# Patient Record
Sex: Male | Born: 1935 | Race: White | Hispanic: No | Smoking: Never smoker
Health system: Southern US, Community
[De-identification: ages and names within clinical notes are randomized; demographics above are authoritative.]

## PROBLEM LIST (undated history)

## (undated) DIAGNOSIS — E119 Type 2 diabetes mellitus without complications: Secondary | ICD-10-CM

## (undated) DIAGNOSIS — N390 Urinary tract infection, site not specified: Secondary | ICD-10-CM

## (undated) DIAGNOSIS — I10 Essential (primary) hypertension: Secondary | ICD-10-CM

## (undated) DIAGNOSIS — G459 Transient cerebral ischemic attack, unspecified: Secondary | ICD-10-CM

## (undated) DIAGNOSIS — H409 Unspecified glaucoma: Secondary | ICD-10-CM

## (undated) DIAGNOSIS — D126 Benign neoplasm of colon, unspecified: Secondary | ICD-10-CM

## (undated) DIAGNOSIS — N400 Enlarged prostate without lower urinary tract symptoms: Secondary | ICD-10-CM

## (undated) DIAGNOSIS — N189 Chronic kidney disease, unspecified: Secondary | ICD-10-CM

## (undated) DIAGNOSIS — T7840XA Allergy, unspecified, initial encounter: Secondary | ICD-10-CM

## (undated) DIAGNOSIS — N200 Calculus of kidney: Secondary | ICD-10-CM

## (undated) DIAGNOSIS — K219 Gastro-esophageal reflux disease without esophagitis: Secondary | ICD-10-CM

## (undated) DIAGNOSIS — M1990 Unspecified osteoarthritis, unspecified site: Secondary | ICD-10-CM

## (undated) DIAGNOSIS — E785 Hyperlipidemia, unspecified: Secondary | ICD-10-CM

## (undated) DIAGNOSIS — A048 Other specified bacterial intestinal infections: Secondary | ICD-10-CM

## (undated) DIAGNOSIS — K5790 Diverticulosis of intestine, part unspecified, without perforation or abscess without bleeding: Secondary | ICD-10-CM

## (undated) HISTORY — DX: Chronic kidney disease, unspecified: N18.9

## (undated) HISTORY — PX: CATARACT EXTRACTION: SUR2

## (undated) HISTORY — DX: Essential (primary) hypertension: I10

## (undated) HISTORY — DX: Urinary tract infection, site not specified: N39.0

## (undated) HISTORY — DX: Gastro-esophageal reflux disease without esophagitis: K21.9

## (undated) HISTORY — DX: Diverticulosis of intestine, part unspecified, without perforation or abscess without bleeding: K57.90

## (undated) HISTORY — PX: LITHOTRIPSY: SUR834

## (undated) HISTORY — DX: Benign prostatic hyperplasia without lower urinary tract symptoms: N40.0

## (undated) HISTORY — DX: Hyperlipidemia, unspecified: E78.5

## (undated) HISTORY — PX: ESOPHAGOGASTRODUODENOSCOPY ENDOSCOPY: SHX5814

## (undated) HISTORY — PX: COLONOSCOPY: SHX174

## (undated) HISTORY — DX: Unspecified glaucoma: H40.9

## (undated) HISTORY — DX: Allergy, unspecified, initial encounter: T78.40XA

## (undated) HISTORY — DX: Transient cerebral ischemic attack, unspecified: G45.9

## (undated) HISTORY — DX: Calculus of kidney: N20.0

## (undated) HISTORY — DX: Unspecified osteoarthritis, unspecified site: M19.90

## (undated) HISTORY — DX: Benign neoplasm of colon, unspecified: D12.6

---

## 2007-02-19 ENCOUNTER — Emergency Department (HOSPITAL_COMMUNITY): Admission: EM | Admit: 2007-02-19 | Discharge: 2007-02-19 | Payer: Self-pay | Admitting: Emergency Medicine

## 2007-02-22 ENCOUNTER — Ambulatory Visit (HOSPITAL_COMMUNITY): Admission: RE | Admit: 2007-02-22 | Discharge: 2007-02-22 | Payer: Self-pay | Admitting: Urology

## 2007-05-02 ENCOUNTER — Ambulatory Visit (HOSPITAL_COMMUNITY): Admission: RE | Admit: 2007-05-02 | Discharge: 2007-05-02 | Payer: Self-pay | Admitting: Urology

## 2007-05-03 ENCOUNTER — Ambulatory Visit (HOSPITAL_COMMUNITY): Admission: RE | Admit: 2007-05-03 | Discharge: 2007-05-03 | Payer: Self-pay | Admitting: Urology

## 2007-06-05 ENCOUNTER — Ambulatory Visit (HOSPITAL_COMMUNITY): Admission: RE | Admit: 2007-06-05 | Discharge: 2007-06-05 | Payer: Self-pay | Admitting: Urology

## 2007-06-15 ENCOUNTER — Ambulatory Visit (HOSPITAL_COMMUNITY): Admission: RE | Admit: 2007-06-15 | Discharge: 2007-06-15 | Payer: Self-pay | Admitting: Urology

## 2007-06-25 ENCOUNTER — Ambulatory Visit (HOSPITAL_COMMUNITY): Admission: RE | Admit: 2007-06-25 | Discharge: 2007-06-25 | Payer: Self-pay | Admitting: Urology

## 2007-07-02 ENCOUNTER — Encounter (INDEPENDENT_AMBULATORY_CARE_PROVIDER_SITE_OTHER): Payer: Self-pay | Admitting: Urology

## 2007-07-02 ENCOUNTER — Ambulatory Visit (HOSPITAL_COMMUNITY): Admission: RE | Admit: 2007-07-02 | Discharge: 2007-07-02 | Payer: Self-pay | Admitting: Urology

## 2007-07-16 ENCOUNTER — Ambulatory Visit (HOSPITAL_COMMUNITY): Admission: RE | Admit: 2007-07-16 | Discharge: 2007-07-16 | Payer: Self-pay | Admitting: Urology

## 2007-08-13 ENCOUNTER — Ambulatory Visit (HOSPITAL_COMMUNITY): Admission: RE | Admit: 2007-08-13 | Discharge: 2007-08-13 | Payer: Self-pay | Admitting: Urology

## 2007-09-25 ENCOUNTER — Ambulatory Visit (HOSPITAL_COMMUNITY): Admission: RE | Admit: 2007-09-25 | Discharge: 2007-09-25 | Payer: Self-pay | Admitting: Urology

## 2007-11-15 ENCOUNTER — Ambulatory Visit: Payer: Self-pay | Admitting: Internal Medicine

## 2007-11-15 DIAGNOSIS — D126 Benign neoplasm of colon, unspecified: Secondary | ICD-10-CM | POA: Insufficient documentation

## 2007-11-15 DIAGNOSIS — Z86010 Personal history of colonic polyps: Secondary | ICD-10-CM | POA: Insufficient documentation

## 2007-11-16 ENCOUNTER — Ambulatory Visit (HOSPITAL_COMMUNITY): Admission: RE | Admit: 2007-11-16 | Discharge: 2007-11-16 | Payer: Self-pay | Admitting: Urology

## 2007-11-19 ENCOUNTER — Encounter: Payer: Self-pay | Admitting: Internal Medicine

## 2007-11-19 ENCOUNTER — Ambulatory Visit: Payer: Self-pay | Admitting: Internal Medicine

## 2007-11-21 ENCOUNTER — Encounter: Payer: Self-pay | Admitting: Internal Medicine

## 2008-02-13 ENCOUNTER — Ambulatory Visit: Payer: Self-pay | Admitting: Internal Medicine

## 2008-02-27 ENCOUNTER — Encounter: Payer: Self-pay | Admitting: Internal Medicine

## 2008-02-27 ENCOUNTER — Ambulatory Visit: Payer: Self-pay | Admitting: Internal Medicine

## 2008-02-29 ENCOUNTER — Encounter: Payer: Self-pay | Admitting: Internal Medicine

## 2008-03-05 ENCOUNTER — Ambulatory Visit: Payer: Self-pay | Admitting: Cardiology

## 2008-03-05 ENCOUNTER — Observation Stay (HOSPITAL_COMMUNITY): Admission: EM | Admit: 2008-03-05 | Discharge: 2008-03-06 | Payer: Self-pay | Admitting: Emergency Medicine

## 2008-03-06 ENCOUNTER — Encounter: Payer: Self-pay | Admitting: Cardiology

## 2008-07-04 ENCOUNTER — Ambulatory Visit (HOSPITAL_COMMUNITY): Admission: RE | Admit: 2008-07-04 | Discharge: 2008-07-04 | Payer: Self-pay | Admitting: Urology

## 2008-07-14 ENCOUNTER — Encounter (INDEPENDENT_AMBULATORY_CARE_PROVIDER_SITE_OTHER): Payer: Self-pay | Admitting: *Deleted

## 2008-07-14 LAB — CONVERTED CEMR LAB
Alkaline Phosphatase: 79 units/L
Cholesterol: 142 mg/dL
Creatinine, Ser: 1.17 mg/dL
GFR calc non Af Amer: >60 mL/min
Glomerular Filtration Rate, Af Am: >60 mL/min/{1.73_m2}
Glucose, Bld: 114 mg/dL
HDL: 48 mg/dL
Potassium: 4.1 meq/L
TSH: 3.637 microintl units/mL

## 2009-01-05 ENCOUNTER — Ambulatory Visit (HOSPITAL_COMMUNITY): Admission: RE | Admit: 2009-01-05 | Discharge: 2009-01-05 | Payer: Self-pay | Admitting: Urology

## 2009-01-14 ENCOUNTER — Emergency Department (HOSPITAL_COMMUNITY): Admission: EM | Admit: 2009-01-14 | Discharge: 2009-01-14 | Payer: Self-pay | Admitting: Emergency Medicine

## 2009-01-14 LAB — CONVERTED CEMR LAB
CO2: 28 meq/L
Creatinine, Ser: 1.3 mg/dL
Potassium: 4.1 meq/L
Sodium: 137 meq/L

## 2009-02-10 ENCOUNTER — Encounter (INDEPENDENT_AMBULATORY_CARE_PROVIDER_SITE_OTHER): Payer: Self-pay | Admitting: *Deleted

## 2009-02-13 ENCOUNTER — Ambulatory Visit: Payer: Self-pay | Admitting: Cardiology

## 2009-02-13 ENCOUNTER — Encounter (INDEPENDENT_AMBULATORY_CARE_PROVIDER_SITE_OTHER): Payer: Self-pay | Admitting: *Deleted

## 2009-02-13 DIAGNOSIS — G459 Transient cerebral ischemic attack, unspecified: Secondary | ICD-10-CM | POA: Insufficient documentation

## 2009-02-13 DIAGNOSIS — R079 Chest pain, unspecified: Secondary | ICD-10-CM | POA: Insufficient documentation

## 2009-02-13 DIAGNOSIS — Z87898 Personal history of other specified conditions: Secondary | ICD-10-CM

## 2009-02-13 DIAGNOSIS — E782 Mixed hyperlipidemia: Secondary | ICD-10-CM | POA: Insufficient documentation

## 2009-02-13 DIAGNOSIS — E785 Hyperlipidemia, unspecified: Secondary | ICD-10-CM

## 2009-02-13 DIAGNOSIS — I10 Essential (primary) hypertension: Secondary | ICD-10-CM | POA: Insufficient documentation

## 2009-02-13 DIAGNOSIS — N200 Calculus of kidney: Secondary | ICD-10-CM

## 2009-02-24 ENCOUNTER — Encounter (INDEPENDENT_AMBULATORY_CARE_PROVIDER_SITE_OTHER): Payer: Self-pay | Admitting: *Deleted

## 2009-02-25 ENCOUNTER — Ambulatory Visit: Payer: Self-pay | Admitting: Internal Medicine

## 2009-02-26 ENCOUNTER — Ambulatory Visit: Payer: Self-pay | Admitting: Cardiology

## 2009-03-20 ENCOUNTER — Encounter: Payer: Self-pay | Admitting: Cardiology

## 2009-03-26 ENCOUNTER — Ambulatory Visit: Payer: Self-pay | Admitting: Internal Medicine

## 2009-03-30 ENCOUNTER — Encounter: Payer: Self-pay | Admitting: Internal Medicine

## 2009-07-07 ENCOUNTER — Ambulatory Visit (HOSPITAL_COMMUNITY): Admission: RE | Admit: 2009-07-07 | Discharge: 2009-07-07 | Payer: Self-pay | Admitting: Urology

## 2010-02-23 NOTE — Assessment & Plan Note (Signed)
Summary: **per Dr.Zach Margo Aye to evaluate for stress test/tg   Visit Type:  Follow-up Referring Provider:  Neurology-Dr. Pearlean Brownie Primary Provider:  Dr. Catalina Pizza   History of Present Illness: It was my pleasure to evaluate this nice gentleman from a cardiovascular standpoint at the request of Dr. Margo Aye.  Stephen [name] was admitted to our service in February of last year when he presented with chest discomfort, headache and neurologic symptoms.  Carotid ultrasound was initially interpreted as showing moderate obstructive disease, but subsequent assessment by Dr. Pearlean Brownie proved negative.  He has not been treated for transient ischemic attack nor has he had any recurrent symptoms.  His chest discomfort was considered atypical, and only a resting echocardiogram was obtained, which was normal.  He presented again to the emergency department last month with vague chest symptoms and elevated blood pressure.  He experienced a poorly characterized sensation in his chest that sounds most like a wave of heat ascending from his legs to the top of his head.  There was no associated dyspnea, diaphoresis nor nausea.  Symptoms passed within a few minutes;  cardiac markers and EKGs were negative.  He was discharged home from the ED and  has felt fine since.  EKG  Procedure date:  02/13/2009  Findings:      Normal sinus rhythm Frequent PACs Delayed R-wave progression Otherwise within normal limits No prior tracing for comparison  CXR  Procedure date:  01/14/2009  Findings:      No acute cardiopulmonary abnormality No osseous abnormality  Echocardiogram  Procedure date:  03/06/2008  Findings:      LV-normal size; no left ventricular hypertrophy; normal systolic function; The ejection fraction of 60%.  No significant valvular abnormalities; trivial tricuspid regurgitation.  -  Date:  01/14/2009    BG Random: 117    BUN: 14    Creatinine: 1.3    Sodium: 137    Potassium: 4.1    Chloride: 103  CO2 Total: 28    Calcium: 8.7    GFR(Non African American): 54   Current Medications (verified): 1)  Co Q-10 30 Mg  Caps (Coenzyme Q10) .Marland Kitchen.. 1 By Mouth Once Daily 2)  Evening Primrose Oil 500 Mg Caps (Evening Primrose Oil) .Marland Kitchen.. 1 By Mouth Two Times A Day 3)  Flax   Oil (Flaxseed (Linseed)) .Marland Kitchen.. 1 By Mouth Two Times A Day 4)  Natural Vitamin E 400 Unit Caps (Vitamin E) .Marland Kitchen.. 1 By Mouth Two Times A Day 5)  Vitamin D3 10000 Unit Caps (Cholecalciferol) .Marland Kitchen.. 1 By Mouth Two Times A Day 6)  Multivitamins   Tabs (Multiple Vitamin) 7)  B-Complex/b-12  Liqd (B Complex Vitamins) .Marland Kitchen.. 1 Sl Once Daily 8)  Probiotic  Caps (Misc Intestinal Flora Regulat) .Marland Kitchen.. 1 By Mouth Once Daily 9)  Metamucil 48.57 % Powd (Psyllium) .... 3 Grams Two Times A Day 10)  Crestor 5 Mg Tabs (Rosuvastatin Calcium) .... Take 1/2 Tab Daily 11)  Amlodipine Besylate 10 Mg Tabs (Amlodipine Besylate) .... Take 1 Tab Daily 12)  Niaspan 1000 Mg Cr-Tabs (Niacin (Antihyperlipidemic)) .... Take 1 Tab At Bedtime 13)  Colace 100 Mg Caps (Docusate Sodium) .... Take 1 Tab Two Times A Day 14)  Krill Oil 1000 Mg Caps (Krill Oil) .... Take 1 Cap Daily 15)  Sm Chromium Picolinate 200 Mcg Tabs (Chromium Picolinate) .... Take 1 Tab Two Times A Day 16)  Magnesium 200 Mg Tabs (Magnesium) .... Take 1 Tab Two Times A Day 17)  Vitamin K (Phytonadione)  100 Mcg Tabs (Phytonadione) .... Take 1 Tab Two Times A Day  Allergies (verified): 1)  ! * Levoquin 2)  ! Benicar (Olmesartan Medoxomil)  Past History:  Past Medical History: Last updated: 2009-02-22 Chest pain Right cerebral transient ischemic attack in 02/2008 Hyperlipidemia Hypertension Chronic kidney disease: Creatinine 1.2 in 2010 Benign prostatic hypertrophy Kidney Stones Urinary Tract Infection Arthritis Adenomatous Colon Polyps  Past Surgical History: Last updated: 2009/02/22 Bilateral cataract extraction Lithotripsy for nephrolithiasis  Family History: Last updated:  February 22, 2009 Father died due to myocardial infarction in his 57s No FH of Colon Cancer: Family History of Pancreatic Cancer: Brother Family History of Colon Polyps:Brother Family History of Heart Disease: Brother Lung Cancer Brother  Social History: Last updated: 2009-02-22 Resides in [place] by himself; widower Alcohol Use - no Daily Caffeine Use Illicit Drug Use - no Patient gets regular exercise. Retired from [employer] work Patient has never smoked.   Review of Systems       Requires corrective lenses for near vision; intermittent constipation; gastroesophageal reflux disease symptoms; urinary frequency; diffuse arthritic discomfort.  All other systems reviewed and are negative.  Vital Signs:  Patient profile:   75 year old male Weight:      174 pounds BMI:     26.55 Pulse rate:   74 / minute BP sitting:   168 / 72  (right arm)  Vitals Entered By: Dreama Saa, CNA (02/22/2009 1:55 PM)  Physical Exam  General:   General-Well-developed; no acute distress: HEENT-[place]/AT; PERRL; EOM intact; conjunctiva and lids nl:  Neck-No JVD; no carotid bruits: Endocrine-No thyromegaly: Lungs-No tachypnea, clear without rales, rhonchi or wheezes: CV-normal PMI; normal S1 and S2:;  Abdomen-BS normal; soft and non-tender without masses or organomegaly: MS-No deformities, cyanosis or clubbing: Neurologic-Nl cranial nerves; symmetric strength and tone: Skin- Warm, no sig. lesions: Extremities-Nl distal pulses; no edema    Impression & Recommendations:  Problem # 1:  CHEST PAIN (ICD-786.50) Symptoms are quite atypical, and both EKGs and cardiac markers were interpreted as negative in the emergency department.  His electrocardiogram today shows delayed R wave progression that is probably a normal variant.  In light of his echocardiogram less than a year ago that revealed normal left ventricular systolic function, no further testing for this possibility is presently needed.  A  standard treadmill stress test will be performed.  Problem # 2:  HYPERLIPIDEMIA (ICD-272.4) Control of hyperlipidemia is excellent.  Lipid profile 6 months ago showed total cholesterol of 142, triglycerides 134, HDL 40 and LDL 67.  Current regimen will be continued.  Problem # 3:  HYPERTENSION, BENIGN (ICD-401.1) Blood pressure control is suboptimal at today's visit, but the patient reports white coat hypertension and good values at home.  No modification in his medical regime will be undertaken at present.  Problem # 4:  TRANSIENT ISCHEMIC ATTACK (ICD-435.9) No therapy was recommended for him during his last hospitalization, but he does take aspirin on a daily basis to prevent flushing related to niacin.  This is appropriate therapy for his possible cerebrovascular disease.  No routine Cardiology f/u appears necessary at the present time.  Other Orders: Treadmill (Treadmill)  Patient Instructions: 1)  Your physician recommends that you schedule a follow-up appointment in: as needed 2)  Your physician has requested that you have an exercise tolerance test.  For further information please visit [email or web address]  Please also follow instruction sheet, as given.

## 2010-02-23 NOTE — Letter (Signed)
Summary: [place] Treadmill (Nuc Med Stress)  [facility]  [address] [place] [address]   Phone: [phone]  Fax: [phone]    Nuclear Medicine 1-Day Stress Test Information Sheet  Re:     Stephen [name]   DOB:     November 16, 1935 MRN:     [id number] Weight:  Appointment Date: Register at: Appointment Time: Referring MD:  _ X__Exercise Stress  __Adenosine   __Dobutamine  __Lexiscan  __Persantine   __Thallium  Urgency: ____1 (next day)   ____2 (one week)    ____3 (PRN)  Patient will receive Follow Up call with results: Patient needs follow-up appointment:  Instructions regarding medication:  How to prepare for your stress test: 1. DO NOT eat or dring 6 hours prior to your arrival time. This includes no caffeine (coffee, tea, sodas, chocolate) if you were instructed to take your medications, drink water with it. 2. DO NOT use any tobacco products for at leaset 8 hours prior to arrival. 3. DO NOT wear dresses or any clothing that may have metal clasps or buttons. 4. Wear short sleeve shirts, loose clothing, and comfortalbe walking shoes. 5. DO NOT use lotions, oils or powder on your chest before the test. 6. The test will take approximately 3-4 hours from the time you arrive until completion. 7. To register the day of the test, go to the Short Stay entrance at [facility]. 8. If you must cancel your test, call [phone] as soon as you are aware.  After you arrive for test:   When you arrive at [facility], you will go to Short Stay to be registered. They will then send you to Radiology to check in. The Nuclear Medicine Tech will get you and start an IV in your arm or hand. A small amount of a radioactive tracer will then be injected into your IV. This tracer will then have to circulate for 30-45 minutes. During this time you will wait in the waiting room and you will be able to drink something without caffeine. A series of pictures will be  taken of your heart follwoing this waiting period. After the 1st set of pictures you will go to the stress lab to get ready for your stress test. During the stress test, another small amount of a radioactive tracer will be injected through your IV. When the stress test is complete, there is a short rest period while your heart rate and blood pressure will be monitored. When this monitoring period is complete you will have another set of pictrues taken. (The same as the 1st set of pictures). These pictures are taken between 15 minutes and 1 hour after the stress test. The time depends on the type of stress test you had. Your doctor will inform you of your test results within 7 days after test.    The possibilities of certain changes are possible during the test. They include abnormal blood pressure and disorders of the heart. Side effects of persantine or adenosine can include flushing, chest pain, shortness of breath, stomach tightness, headache and light-headedness. These side effects usually do not last long and are self-resolving. Every effort will be made to keep you comfortable and to minimize complications by obtaining a medical history and by close observation during the test. Emergency equipment, medications, and trained personnel are available to deal with any unusual situation which may arise.  Please notify office at least 48 hours in advance if you are unable to  keep this appt.

## 2010-02-23 NOTE — Letter (Signed)
Summary: MEDICATION LIST  MEDICATION LIST   Imported By: Faythe Ghee 03/20/2009 10:37:04  _____________________________________________________________________  External Attachment:    Type:   Image     Comment:   External Document

## 2010-02-23 NOTE — Miscellaneous (Signed)
Summary: [facility] Previsit/prep  Clinical Lists Changes  Medications: Added new medication of MOVIPREP 100 GM  SOLR (PEG-KCL-NACL-NASULF-NA ASC-C) As per prep instructions. - Signed Rx of MOVIPREP 100 GM  SOLR (PEG-KCL-NACL-NASULF-NA ASC-C) As per prep instructions.;  #1 x 0;  Signed;  Entered by: Wyona Almas RN;  Authorized by: Hilarie Fredrickson MD;  Method used: Electronically to [facility] [phone]*, [address] [place], [place], [place]  [address], Ph: [phone] or [phone], Fax: [phone] Observations: Added new observation of ALLERGY REV: Done (02/25/2009 15:39)    Prescriptions: MOVIPREP 100 GM  SOLR (PEG-KCL-NACL-NASULF-NA ASC-C) As per prep instructions.  #1 x 0   Entered by:   Wyona Almas RN   Authorized by:   Hilarie Fredrickson MD   Signed by:   Wyona Almas RN on 02/25/2009   Method used:   Electronically to        [facility] [phone]* (retail)       [address]       [place], [place]  [address]       Ph: [phone] or [phone]       Fax: [phone]   RxID:   [id number]

## 2010-02-23 NOTE — Miscellaneous (Signed)
Summary: LABS CMP,LIPIDS,TSH 07/14/2008  Clinical Lists Changes  Observations: Added new observation of CALCIUM: 9.0 mg/dL (16/10/9602 54:09) Added new observation of ALBUMIN: 4.6 g/dL (81/19/1478 29:56) Added new observation of PROTEIN, TOT: 7.5 g/dL (21/30/8657 84:69) Added new observation of SGPT (ALT): 21 units/L (07/14/2008 11:37) Added new observation of SGOT (AST): 16 units/L (07/14/2008 11:37) Added new observation of ALK PHOS: 79 units/L (07/14/2008 11:37) Added new observation of GFR AA: >60 mL/min/1.68m2 (07/14/2008 11:37) Added new observation of GFR: >60 mL/min (07/14/2008 11:37) Added new observation of CREATININE: 1.17 mg/dL (62/95/2841 32:44) Added new observation of BUN: 18 mg/dL (01/26/7251 66:44) Added new observation of BG RANDOM: 114 mg/dL (03/47/4259 56:38) Added new observation of CO2 PLSM/SER: 23 meq/L (07/14/2008 11:37) Added new observation of CL SERUM: 103 meq/L (07/14/2008 11:37) Added new observation of K SERUM: 4.1 meq/L (07/14/2008 11:37) Added new observation of NA: 141 meq/L (07/14/2008 11:37) Added new observation of LDL: 67 mg/dL (75/64/3329 51:88) Added new observation of HDL: 48 mg/dL (41/66/0630 16:01) Added new observation of TRIGLYC TOT: 134 mg/dL (09/32/3557 32:20) Added new observation of CHOLESTEROL: 142 mg/dL (25/42/7062 37:62) Added new observation of TSH: 3.637 microintl units/mL (07/14/2008 11:37)

## 2010-02-23 NOTE — Letter (Signed)
Summary: Patient Notice- Polyp Results  [facility]  [address] [place], [place] [address]   Phone: [phone]  Fax: [phone]        March 30, 2009 MRN: [id number]    [place] [address] [place], [place]  [address]    Dear Mr. Boehner,  I am pleased to inform you that the colon polyp(s) removed during your recent colonoscopy was (were) found to be benign (no cancer detected) upon pathologic examination.  I recommend you have a repeat colonoscopy examination in 2 years to look for recurrent polyps, as having colon polyps increases your risk for having recurrent polyps or even colon cancer in the future.  Should you develop new or worsening symptoms of abdominal pain, bowel habit changes or bleeding from the rectum or bowels, please schedule an evaluation with either your primary care physician or with me.  Additional information/recommendations:  __ No further action with gastroenterology is needed at this time. Please      follow-up with your primary care physician for your other healthcare      needs.   Please call us if you are having persistent problems or have questions about your condition that have not been fully answered at this time.  Sincerely,  Hilarie Fredrickson MD  This letter has been electronically signed by your physician.  Appended Document: Patient Notice- Polyp Results Letter mailed 3.8.11

## 2010-02-23 NOTE — Letter (Signed)
Summary: [facility] Instructions  Stephen [name]  [address] [name], [place] [address]   Phone: [phone]  Fax: [phone]       Stephen [name]    [birth year]    MRN: [id number]        Procedure Day Dorna Bloom:  Stephen [name]  03/26/09     Arrival Time:  12:30PM     Procedure Time:  1:30PM     Location of Procedure:                    _ X_  [facility] (4th Floor)                        PREPARATION FOR COLONOSCOPY WITH MOVIPREP   Starting 5 days prior to your procedure 03/21/09 do not eat nuts, seeds, popcorn, corn, beans, peas,  salads, or any raw vegetables.  Do not take any fiber supplements (e.g. Metamucil, Citrucel, and Benefiber).  THE DAY BEFORE YOUR PROCEDURE         DATE: 03/25/09  DAY: WEDNESDAY  1.  Drink clear liquids the entire day-NO SOLID FOOD  2.  Do not drink anything colored red or purple.  Avoid juices with pulp.  No orange juice.  3.  Drink at least 64 oz. (8 glasses) of fluid/clear liquids during the day to prevent dehydration and help the prep work efficiently.  CLEAR LIQUIDS INCLUDE: Water Jello Ice Popsicles Tea (sugar ok, no milk/cream) Powdered fruit flavored drinks Coffee (sugar ok, no milk/cream) Gatorade Juice: apple, white grape, white cranberry  Lemonade Clear bullion, consomm, broth Carbonated beverages (any kind) Strained chicken noodle soup Hard Candy                             4.  In the morning, mix first dose of MoviPrep solution:    Empty 1 Pouch A and 1 Pouch B into the disposable container    Add lukewarm drinking water to the top line of the container. Mix to dissolve    Refrigerate (mixed solution should be used within 24 hrs)  5.  Begin drinking the prep at 5:00 p.m. The MoviPrep container is divided by 4 marks.   Every 15 minutes drink the solution down to the next mark (approximately 8 oz) until the full liter is complete.   6.  Follow completed prep with 16 oz of clear liquid of your choice  (Nothing red or purple).  Continue to drink clear liquids until bedtime.  7.  Before going to bed, mix second dose of MoviPrep solution:    Empty 1 Pouch A and 1 Pouch B into the disposable container    Add lukewarm drinking water to the top line of the container. Mix to dissolve    Refrigerate  THE DAY OF YOUR PROCEDURE      DATE: 03/26/09  DAY: THURSDAY Beginning at 8:30AM (5 hours before procedure):         1. Every 15 minutes, drink the solution down to the next mark (approx 8 oz) until the full liter is complete.  2. Follow completed prep with 16 oz. of clear liquid of your choice.    3. You may drink clear liquids until 11:30AM (2 HOURS BEFORE PROCEDURE).   MEDICATION INSTRUCTIONS  Unless otherwise instructed, you should take regular prescription medications with a small sip of water   as early as possible the morning of  your procedure.          OTHER INSTRUCTIONS  You will need a responsible adult at least 73 years of age to accompany you and drive you home.   This person must remain in the waiting room during your procedure.  Wear loose fitting clothing that is easily removed.  Leave jewelry and other valuables at home.  However, you may wish to bring a book to read or  an iPod/MP3 player to listen to music as you wait for your procedure to start.  Remove all body piercing jewelry and leave at home.  Total time from sign-in until discharge is approximately 2-3 hours.  You should go home directly after your procedure and rest.  You can resume normal activities the  day after your procedure.  The day of your procedure you should not:   Drive   Make legal decisions   Operate machinery   Drink alcohol   Return to work  You will receive specific instructions about eating, activities and medications before you leave.    The above instructions have been reviewed and explained to me by   Wyona Almas RN  February 25, 2009 4:21 PM     I fully  understand and can verbalize these instructions _____________________________ Date _________

## 2010-02-23 NOTE — Procedures (Signed)
Summary: Colonoscopy  Patient: [name] Note: All result statuses are Final unless otherwise noted.  Tests: (1) Colonoscopy (COL)   COL Colonoscopy           DONE     [facility]     [address]     [place], [place]  [address]           COLONOSCOPY PROCEDURE REPORT           PATIENT:  Stephen [name], Stephen [name]  MR#:  [id number]     BIRTHDATE:  04-14-1935, 74 yrs. old  GENDER:  male           ENDOSCOPIST:  Wilhemina Bonito. Eda Keys, MD     Referred by:  Surveillance Program Recall,           PROCEDURE DATE:  03/26/2009     PROCEDURE:  Colonoscopy with snare polypectomy x 4     ASA CLASS:  Class II     INDICATIONS:  history of pre-cancerous (adenomatous) colon polyps     ; 02-2007 W/ MULTIPLE POLYPS AND LARGE CECAL ADENOMA; FOLLOW UP     10-2007 AND 02-2008 W/ MULTIPLE ADENOMAS AS WELL           MEDICATIONS:   Fentanyl 75 mcg IV, Versed 9 mg IV           DESCRIPTION OF PROCEDURE:   After the risks benefits and     alternatives of the procedure were thoroughly explained, informed     consent was obtained.  Digital rectal exam was performed and     revealed no abnormalities.   The LB [id number] endoscope     was introduced through the anus and advanced to the cecum, which     was identified by both the appendix and ileocecal valve, without     limitations. Time to cecum = 3:26 min.The quality of the prep was     excellent, using MoviPrep.  The instrument was then slowly     withdrawn (time = 14:31 min) as the colon was fully examined.     <<PROCEDUREIMAGES>>           FINDINGS:  Four polyps were found. 1mm cecal, and 75mm,2mm,5mm     descending colon. Polyps were snared without cautery. Retrieval     was successful.   Moderate diverticulosis was found in the left     colon.   Retroflexed views in the rectum revealed no     abnormalities.    The scope was then withdrawn from the patient     and the procedure completed.           COMPLICATIONS:  None           ENDOSCOPIC  IMPRESSION:     1) Four polyps - removed     2) Moderate diverticulosis in the left colon     RECOMMENDATIONS:     1) Follow up colonoscopy in 2 years           ______________________________     Wilhemina Bonito. Eda Keys, MD           CC:  The Patient           n.     eSIGNED:   Wilhemina Bonito. Eda Keys at 03/26/2009 02:10 PM           Barry Dienes, [id number]  Note: An exclamation mark (!) indicates a result that was not dispersed into the flowsheet. Document  Creation Date: 03/26/2009 2:10 PM _______________________________________________________________________  (1) Order result status: Final Collection or observation date-time: 03/26/2009 14:04 Requested date-time:  Receipt date-time:  Reported date-time:  Referring Physician:   Ordering Physician: Fransico Setters [phone]) Specimen Source:  Source: Launa Grill Order Number: [id number] Lab site:   Appended Document: Colonoscopy recall in 2 yrs/03-2011     Procedures Next Due Date:    Colonoscopy: 03/2011

## 2010-04-22 ENCOUNTER — Other Ambulatory Visit: Payer: Self-pay | Admitting: Urology

## 2010-04-22 ENCOUNTER — Ambulatory Visit (HOSPITAL_COMMUNITY)
Admission: RE | Admit: 2010-04-22 | Discharge: 2010-04-22 | Disposition: A | Discharge status: Home / Self Care | Payer: Medicare Other | Source: Ambulatory Visit | Attending: Urology | Admitting: Urology

## 2010-04-22 DIAGNOSIS — N200 Calculus of kidney: Secondary | ICD-10-CM

## 2010-04-22 DIAGNOSIS — R109 Unspecified abdominal pain: Secondary | ICD-10-CM | POA: Insufficient documentation

## 2010-04-23 ENCOUNTER — Ambulatory Visit (INDEPENDENT_AMBULATORY_CARE_PROVIDER_SITE_OTHER): Payer: Medicare Other | Admitting: Urology

## 2010-04-23 DIAGNOSIS — Z87442 Personal history of urinary calculi: Secondary | ICD-10-CM

## 2010-04-26 LAB — DIFFERENTIAL
Eosinophils Absolute: 0 10*3/uL (ref 0.0–0.7)
Eosinophils Relative: 1 % (ref 0–5)
Lymphocytes Relative: 28 % (ref 12–46)
Lymphs Abs: 1.7 10*3/uL (ref 0.7–4.0)
Monocytes Absolute: 0.4 10*3/uL (ref 0.1–1.0)

## 2010-04-26 LAB — POCT CARDIAC MARKERS
CKMB, poc: 1.2 ng/mL (ref 1.0–8.0)
Myoglobin, poc: 83.4 ng/mL (ref 12–200)
Troponin i, poc: <0.05 ng/mL (ref 0.00–0.09)
Troponin i, poc: <0.05 ng/mL (ref 0.00–0.09)

## 2010-04-26 LAB — BASIC METABOLIC PANEL
BUN: 14 mg/dL (ref 6–23)
Chloride: 103 mEq/L (ref 96–112)
Potassium: 4.1 mEq/L (ref 3.5–5.1)
Sodium: 137 mEq/L (ref 135–145)

## 2010-04-26 LAB — CBC
HCT: 40.9 % (ref 39.0–52.0)
Hemoglobin: 14 g/dL (ref 13.0–17.0)
MCV: 95.9 fL (ref 78.0–100.0)
Platelets: 130 10*3/uL — ABNORMAL LOW (ref 150–400)
WBC: 5.9 10*3/uL (ref 4.0–10.5)

## 2010-05-11 LAB — CBC
MCV: 94.9 fL (ref 78.0–100.0)
Platelets: 158 10*3/uL (ref 150–400)
WBC: 6.5 10*3/uL (ref 4.0–10.5)

## 2010-05-11 LAB — LIPID PANEL
HDL: 30 mg/dL — ABNORMAL LOW (ref >39)
Total CHOL/HDL Ratio: 6.3 RATIO
Triglycerides: 91 mg/dL (ref <150)

## 2010-05-11 LAB — MAGNESIUM: Magnesium: 2.4 mg/dL (ref 1.5–2.5)

## 2010-05-11 LAB — BASIC METABOLIC PANEL
BUN: 14 mg/dL (ref 6–23)
Calcium: 8.8 mg/dL (ref 8.4–10.5)
Creatinine, Ser: 1.31 mg/dL (ref 0.4–1.5)
GFR calc Af Amer: >60 mL/min (ref >60)
GFR calc non Af Amer: 54 mL/min — ABNORMAL LOW (ref >60)

## 2010-05-11 LAB — CARDIAC PANEL(CRET KIN+CKTOT+MB+TROPI)
CK, MB: 1.7 ng/mL (ref 0.3–4.0)
Troponin I: 0.01 ng/mL (ref 0.00–0.06)
Troponin I: 0.01 ng/mL (ref 0.00–0.06)

## 2010-05-11 LAB — PROTIME-INR
INR: 1 (ref 0.00–1.49)
Prothrombin Time: 13.5 seconds (ref 11.6–15.2)

## 2010-05-11 LAB — APTT: aPTT: 29 seconds (ref 24–37)

## 2010-05-11 LAB — POCT CARDIAC MARKERS: Troponin i, poc: <0.05 ng/mL (ref 0.00–0.09)

## 2010-05-27 ENCOUNTER — Ambulatory Visit (HOSPITAL_COMMUNITY)
Admission: RE | Admit: 2010-05-27 | Discharge: 2010-05-27 | Disposition: A | Discharge status: Home / Self Care | Payer: Medicare Other | Source: Ambulatory Visit | Attending: Urology | Admitting: Urology

## 2010-05-27 ENCOUNTER — Other Ambulatory Visit: Payer: Self-pay | Admitting: Urology

## 2010-05-27 DIAGNOSIS — N200 Calculus of kidney: Secondary | ICD-10-CM

## 2010-05-27 DIAGNOSIS — R109 Unspecified abdominal pain: Secondary | ICD-10-CM | POA: Insufficient documentation

## 2010-05-28 ENCOUNTER — Ambulatory Visit (INDEPENDENT_AMBULATORY_CARE_PROVIDER_SITE_OTHER): Payer: Medicare Other | Admitting: Urology

## 2010-05-28 DIAGNOSIS — Z87442 Personal history of urinary calculi: Secondary | ICD-10-CM

## 2010-06-08 NOTE — H&P (Signed)
NAME:  Stephen [name], Stephen [name] NO.:  [id number]   MEDICAL RECORD NO.:  [id number]          PATIENT TYPE:  OBV   LOCATION:  A216                          FACILITY:  [facility]   PHYSICIAN:  Osvaldo Shipper, MD     DATE OF BIRTH:  1935-05-23   DATE OF ADMISSION:  03/05/2008  DATE OF DISCHARGE:  LH                              HISTORY & PHYSICAL   PRIMARY MEDICAL DOCTOR:  Dr. Pollyann Kennedy, internist in [place] [employer], [place].   ADMITTING DIAGNOSES:  1. Chest pain with left arm numbness, resolved.  2. History of hypertension.  3. History of dyslipidemia.   CHIEF COMPLAINT:  Left arm numbness and chest discomfort.   HISTORY OF PRESENT ILLNESS:  The patient is a 75 year old Caucasian male  who has hypertension and dyslipidemia, who was in his usual state of  health until this past Monday when he was on his way back from his  doctor's office when he started experiencing left face and left arm  numbness along with tingling sensations.  This lasted a few minutes and  then resolved.  He did not have to take any medications for this.  He  denied any weakness on the left side and denied any slurred speech.  No  visual disturbances, no headaches.  He then this afternoon had similar  symptoms, although this time he experienced discomfort in the left side  of his chest.  This also lasted a few minutes, and before he got into  the ED, the pain had resolved.  The patient was concerned and so he  decided to come into the emergency department.  He denies any shortness  of breath.  No nausea, vomiting.  No fevers, no cough.  He has not had  any similar symptoms in the past.  He has never had a stress test or a  cardiac cath.  He does walk on the treadmill on a daily basis 20-30  minutes at 3.5 speed, and he does use full elevation.   HOME MEDICATIONS:  1. Hydralazine 25 mg b.i.d., which has been stopped by his doctor      because they felt that these symptoms could have been because of      the  medication.  2. Niaspan 500 mg nightly.  3. Multivitamin 1 daily.  4. Potassium citrate 500 mg t.i.d. for urolithiasis.  5. Vitamin C 1000 mg daily.  6. Vitamin E 400 mg b.i.d.  7. Fish Oil daily.  8. Flaxseed daily.  9. Evening primrose daily.  10.B12, B6 and folic acid daily.   He used to be on Lipitor and Benicar, but had an elevation in his  creatinine which prompted his PMD to stop these medications.   PAST MEDICAL HISTORY:  1. Hypertension.  2. Nephrolithiasis.  3. Prostatic hypertrophy.  4. Dyslipidemia.  5. Cataract surgery.  6. Laser lithotripsy.   His urologist is Dr. Dennie Maizes.   SOCIAL HISTORY:  He lives in [place] with his brother.  No smoking.  Occasional alcohol use.  No illicit drug use.   FAMILY HISTORY:  Father died of a heart attack at age  26.  Mother had  lung cancer.  An older brother died of Alzheimer's dementia.  A sister  died of lung cancer.  Another brother died of pancreatic cancer.  His  surviving brother had heart valve surgery and other heart disease.   REVIEW OF SYSTEMS:  GENERAL:  Positive for weakness and malaise.  HEENT:  Unremarkable.  CARDIOVASCULAR:  As in HPI.  RESPIRATORY:  Unremarkable.  GI:  Unremarkable.  NEUROLOGIC:  Unremarkable.  PSYCHIATRIC:  Unremarkable.  Other systems unremarkable.   PHYSICAL EXAMINATION:  VITAL SIGNS:  Temperature 97.6, blood pressure  initially was 170/82, last recorded reading is 144/79, heart rate 60,  respiratory rate 12.  Saturation 100% on room air.  GENERAL:  This is a well-developed, well-nourished white male in no  distress.  HEENT:  There is no pallor, no icterus.  Oral mucous membranes are  moist.  No oral lesions are noted.  NECK:  Soft and supple.  No thyromegaly is appreciated.  LUNGS:  Clear to auscultation bilaterally.  No wheezing, rales or  rhonchi.  CARDIOVASCULAR:  S1-S2 is normal regular.  No murmurs appreciated.  No  S3-S4.  No rubs, no bruits.  ABDOMEN:  Soft, nontender,  nondistended.  Bowel sounds are present.  No  masses or organomegaly appreciated.  NEUROLOGIC:  Unremarkable.  MUSCULOSKELETAL:  Unremarkable.   LABORATORY DATA:  His CBC is unremarkable.  Coags are normal.  BMET  shows a slightly reduced GFR at 54, otherwise unremarkable.  Cardiac  markers negative x1.  EKG shows a sinus rhythm with a normal axis,  intervals appear to be in the normal range, rate of 60.  No Q-waves are  noted.  No concerning ST changes are present on this EKG.   He had a chest x-ray which did not show any acute findings.   ASSESSMENT:  A 75 year old Caucasian male who presents with left-sided  numbness, especially in his arms, face and left-sided chest pain.  Symptoms concerning for coronary artery disease.  The pain is not quite  typical.  He does have risk factors in the form of hypertension and  dyslipidemia.   PLAN:  1. Chest pain with other symptoms.  We will observe him and rule him      out for acute coronary syndrome.  EKGs will be repeated.  Lipid      profile will be checked.  Cardiology will be consulted to consider      an inpatient stress test.  Aspirin will be given.  Low-dose beta-      blocker will be started.  2. Hypertension.  Monitor closely and adjust treatment as needed.  3. Dyslipidemia.  Continue with Niaspan for now.  4. DVT prophylaxis will be initiated.   Further management decisions will be depend on results of further  testing and patient's response to treatment.      Osvaldo Shipper, MD  Electronically Signed     GK/MEDQ  D:  03/05/2008  T:  03/05/2008  Job:  [id number]   cc:   Pollyann Kennedy, MD  Cristela Blue. Dietrich Pates, MD, [facility]  [address]  [place], [place] [address]

## 2010-06-08 NOTE — H&P (Signed)
NAME:  Stephen [name], Stephen [name]              ACCOUNT NO.:  [id number]   MEDICAL RECORD NO.:  [id number]          PATIENT TYPE:  AMB   LOCATION:  DAY                           FACILITY:  [facility]   PHYSICIAN:  Dennie Maizes, M.D.   DATE OF BIRTH:  1935/10/27   DATE OF ADMISSION:  05/02/2007  DATE OF DISCHARGE:  LH                              HISTORY & PHYSICAL   CHIEF COMPLAINT:  Large left renal calculi, left flank pain.   HISTORY OF PRESENT ILLNESS:  The 75 year old male experienced bilateral  lower abdominal pain radiating to the bacteria in January 2009.  He went  to the emergency room at [facility]  Evaluation was done with  CT scan of abdomen and pelvis without contrast.  This revealed multiple  bilateral renal calculi.  There were clusters of stones in the lower  pole of left kidney measuring 8-20 mm in size.  The patient also had a 5-  mm size left distal ureteral calculus with obstruction and  hydronephrosis.  He was seen in the office for evaluation.  Pass the  left distal ureteral calculus was relief of the flank pain.  He still  has a large left renal calculi without obstruction.  He is brought to  the short-stay center today for extracorporeal shock wave lithotripsy of  multiple left renal calculi located in the lower pole area.   The patient denied having any fever, chills, voiding difficulty or gross  hematuria at present.  He has urine good urinary flow.  Urinary  frequency q.2-3 h and nocturia times 0-1.  There is no history of  urinary tract infections recently.   PAST MEDICAL HISTORY:  1. History of hypertension.  2. Elevated cholesterol.  3. Status post cataract surgery.   MEDICATIONS:  1. Benicar 20 mg one p.o. daily.  2. Lipitor 10 mg one p.o. daily .  3. Multivitamins n.p.o. daily.   ALLERGIES:  NO KNOWN DRUG ALLERGIES.   FAMILY HISTORY:  Positive for cardiovascular disease status post, MI  history of cancer in many members of the family.   PHYSICAL  EXAMINATION:  VITAL SIGNS:  Height 5 feet 8 inches, weight  183  pounds.  HEENT:  Normal.  NECK: No masses.  LUNGS: Clear to auscultation.  HEART: Regular rate and rhythm.  No murmurs.  ABDOMEN:  Soft, no palpable flank mass or CVA tenderness.  Bladder is  not palpable.  Penis and testes are normal.  RECTAL:  A 40 gram size benign prostate.   STUDIES:  A recent KUB revealed a cluster of stones in the lower pole of  left kidney measuring 8 x 26 mm in size.   IMPRESSION:  Left renal calculi, left flank pain.   PLAN:  Extracorporeal shock wave lithotripsy of left renal calculi with  IV sedation in short-stay center.  I have discussed with the patient  regarding diagnosis, operative details, alternate treatment, outcome,  possible risks and complications.  He has agreed for the procedure to be  done.  In view of multiple large stones, he may need more than one  session of lithotripsy.  This has been explained to the patient.  Additional procedures may be necessary.      Dennie Maizes, M.D.  Electronically Signed     SK/MEDQ  D:  05/02/2007  T:  05/02/2007  Job:  [id number]   cc:   Short Stay Center

## 2010-06-08 NOTE — H&P (Signed)
NAME:  Stephen [name], Stephen [name]              ACCOUNT NO.:  [id number]   MEDICAL RECORD NO.:  [id number]          PATIENT TYPE:  AMB   LOCATION:  DAY                           [facility]ACILITY:  [facility]   PHYSICIAN:  Dennie Maizes, M.D.   DATE OF BIRTH:  11-Jul-1935   DATE OF ADMISSION:  07/02/2007  DATE OF DISCHARGE:  LH                              HISTORY & PHYSICAL   .   CHIEF COMPLAINT:  Left flank pain, left ureteral stone fragments with  obstruction post ESL left renal calculi.   HISTORY OF PRESENT ILLNESS:  This 75 year old male was evaluated for  flank pain in January 2009.  He had bilateral renal calculi.  There were  stones in the lower pole of left kidney measuring 8-30 mm in size.  The  patient has undergone extracorporeal shock wave lithotripsy of left  renal calculi in May 21, 2007.  Has passed a stone fragments.  He states  revealed a column of ureteral stone fragments in the level of the pelvic  brim.  The patient is unable to pass the stone fragments.  Intermittent  mild left flank pain.  He is brought to the [facility] today for  cystoscopy, left retrograde pyelogram, left ureteroscopy, holmium laser  lithotripsy and left ureteral stent placement.   PAST MEDICAL HISTORY:  1. History of hypertension.  2. Elevated cholesterol.  3. Status post cataract surgery.   MEDICATIONS:  1. Benicar 20 mg p.o. daily.  2. Lipitor 10 mg p.o. daily.  3. Multivitamins p.o. daily   ALLERGIES:  NO KNOWN DRUG ALLERGIES.   FAMILY HISTORY:  Is positive for cardiovascular disease status post MI.  Cancer in many members of the family.   PHYSICAL EXAMINATION:  VITAL SIGNS:  Height 5 feet 8 inches, weight 183  pounds.  HEENT:  Normal.  NECK:  No masses.  LUNGS:  Clear to auscultation.  Regular rate and rhythm.  No murmurs.  ABDOMEN:  Soft, no palpable flank mass or costovertebral angle  tenderness.  Bladder is not palpable.  Penis and testes are normal.  RECTAL:  Has 40 g benign prostate.   IMPRESSION:  Post ESL left renal calculi, left ureteral stone fragments  with obstruction, left flank pain.   PLAN:  Cystoscopy, left retrograde pyelogram, left ureteroscopy, holmium  laser lithotripsy and left ureteral stent placement in [facility].  Discussed with the patient regarding diagnosis, operative  details, outpatient treatment, outcome, possible risks and  complications, and he has agreed for the procedure to be done.      Dennie Maizes, M.D.  Electronically Signed     SK/MEDQ  D:  07/02/2007  T:  07/02/2007  Job:  [id number]

## 2010-06-08 NOTE — Discharge Summary (Signed)
NAME:  Stephen [name], Stephen [name] NO.:  [id number]   MEDICAL RECORD NO.:  [id number]          PATIENT TYPE:  OBV   LOCATION:  A216                          FACILITY:  [facility]   PHYSICIAN:  Osvaldo Shipper, MD     DATE OF BIRTH:  1935/07/18   DATE OF ADMISSION:  03/05/2008  DATE OF DISCHARGE:  02/11/2010LH                               DISCHARGE SUMMARY   PRIMARY CARE PHYSICIAN:  Dr. Pollyann Kennedy in [place], [place].   Please review H&P dictated yesterday at the time of admission for  details regarding the patient's presenting illness.   DISCHARGE DIAGNOSES:  1. Likely transient ischemic attack.  2. Chest pain requiring no further evaluation at this time.  3. History of hypertension, poorly controlled.  4,  History of dyslipidemia, poorly controlled.   BRIEF HOSPITAL COURSE:  Briefly this is a 75 year old Caucasian male who  presented with complaints of left-sided facial and arm numbness as well  at the left-sided chest pressure.  Initially, we felt that the symptoms  were related to his chest pain.  His EKG was unremarkable.  He has ruled  out for acute coronary syndrome.  The patient was seen by Dr. Jens Som  from [facility] who feels that this could have been a TIA and  does not feel the patient has any cardiac issues.  A 2-D echocardiogram  was done which is pending.  As a result of this evaluation, a carotid  Doppler was done which reveals a possible 70% stenosis of the right  internal carotid artery.  The patient had not had a CT scan which will  be done before he is discharged.  His symptoms have completely resolved.  I have discussed the case with Dr. Delia Heady as the patient does not  want to see Dr. Gerilyn Pilgrim and Dr. Pearlean Brownie  thinks the patient can go home  on 325 mg of aspirin and he said that he needs possibly to have an MRA  of his right neck as an outpatient.  He has asked me to convey to the  patient to call Dr. Marlis Edelson office, [phone], to schedule an  appointment.   His LDL has come as 141 and the HDL is 30 so his lipid control as not  optimal.  Blood pressure initially when he came in was very elevated.  Last reading has been 140/72.  At this time the patient is back to  baseline.  Denies any new complaints.  Examination is unremarkable.   DISCHARGE MEDICATIONS:  1. Amlodipine 5 mg daily.  2. Aspirin 325 mg daily.  3. Niaspan 1000 mg daily at bedtime.  This is a changed dose.  4. Otherwise he may continue his multivitamin daily.  5. Potassium citrate 3 times a day.  6. Vitamin C daily.  7. Vitamin E twice daily.  8. Fish oil daily.  9. Flaxseed oil daily.  10.Evening primrose daily.  11.B12/B6/folic acid daily.  12.He needs to discontinue his hydralazine.   FOLLOW UP:  Follow up with Dr. Pearlean Brownie at [phone] as soon as possible for  further evaluation of TIA and abnormal carotid Dopplers.  PENDING STUDIES:  CT scan of the head as well as the echocardiogram, the  results of which are not yet available.  This study was done this  morning.   DIET:  Heart-healthy.   ACTIVITY:  Physical activity as before.   CONSULTATIONS:  [facility] [facility].   Total time discharging for 35 minutes.   ADDENDUM: ECHO showed normal EF with not other significant  abnormalities.      Osvaldo Shipper, MD  Electronically Signed     GK/MEDQ  D:  03/06/2008  T:  03/07/2008  Job:  [id number]   cc:   Pramod P. Pearlean Brownie, MD  Fax: [phone]   Madolyn Frieze. Jens Som, MD, [facility]  [address]  [address]  [place]  [place] [address]

## 2010-06-08 NOTE — Op Note (Signed)
NAME:  BARD, HAUPERT              ACCOUNT NO.:  [id number]   MEDICAL RECORD NO.:  [id number]          PATIENT TYPE:  AMB   LOCATION:  DAY                           FACILITY:  [facility]   PHYSICIAN:  Dennie Maizes, M.D.   DATE OF BIRTH:  11/11/1935   DATE OF PROCEDURE:  07/02/2007  DATE OF DISCHARGE:                               OPERATIVE REPORT   PREOPERATIVE DIAGNOSIS:  Large left renal calculi, post extracorporeal  shockwave lithotripsy, left distal ureteral calculi with obstruction.   POSTOPERATIVE DIAGNOSES:  Large left renal calculi, post extracorporeal  shockwave lithotripsy, left distal ureteral calculi with obstruction.   OPERATIVE PROCEDURE:  Cystoscopy, left retrograde pyelogram, left  ureteroscopy, Holmium laser lithotripsy of ureteral calculi, basket  extraction, and left ureteral stent placement.   ANESTHESIA:  Spinal.   SURGEON:  Dennie Maizes, MD.   COMPLICATIONS:  None.   ESTIMATED BLOOD LOSS:  Minimal.   DRAINS:  6 x 26 cm size left ureteral stent and 16-French Foley catheter  in the bladder.   SPECIMEN:  Left ureteral calculi which I sent for chemical analysis.   COMPLICATIONS:  None.   INDICATIONS FOR PROCEDURE:  This is a 75 year old male who was evaluated  for left flank pain.  The large left renal calculi which I treated with  ESL as an outpatient.  Follow-up x-rays revealed large stone fragment in  the left distal ureter with obstruction.  The patient was taken to the  operating room today for cystoscopy, left retrograde pyelogram, left  ureteroscopy, Holmium laser lithotripsy, and stent placement.   DESCRIPTION OF PROCEDURE:  Spinal anesthesia was induced and the patient  was placed on the OR table in the dorsal lithotomy position.  The lower  abdomen and genitalia were prepped and draped in a sterile fashion.  The  meatal stenosis was noted and the meatus was dilated up to 24-French  with the [employer] sounds.  At 22-French, the cystoscope was  then  introduced and multiple mild urethral strictures were noted in the  posterior urethra.  The scope could be passed through the stricture  without any difficulty.  The prostate was moderately enlarged without  significant obstruction.  The bladder was then examined and found to be  normal.   A 5-French wedge catheter was then placed in the left ureteral orifice.  About 7 mL of Renografin-60 was injected into the collecting system.  Retrograde pyelogram was done.  There were multiple filling defects in  the left distal ureter suggestive of multiple stone fragments.  There  was proximal mild hydronephrosis and hydroureter.   A 5-French open-ended catheter was then placed in the left ureteral  orifice.  A 0.138-gauge Bentson guide with a flexible tip was then  advanced into left renal pelvis without any difficulty.  The distal  ureter was dilated using an 18-French 10-cm length balloon dilating  catheter.  The balloon dilating catheter was then removed leaving the  guidewire in place.  Ureteroscopy was done with the rigid 8.5-French  ureteroscope.  Multiple stone fragments were noted in the left distal  ureter.  335 micron laser  fiber was inserted through the ureteroscope  and the large stone fragments were fragmented to small ones.  Most of  stone fragments were broken into piece to size 3-4 mm.  A Nitinol basket  was then inserted through the ureteroscope.  The largest fragments were  removed with the basket.  The stone fragments was smaller to pass later.   A 6-French 26-cm size stent was then inserted in the left collecting  system without any difficulty.  A 16-French Foley catheter was inserted  into the bladder and the returns were clear.  The patient was  transferred to the PACU in a satisfactory condition.   ADDENDUM   LASER SETTINGS:  325 micron fiber, energy 0.8 joules, rate 15 pulses  power, 12.0 watts.  Total energy 1.43 kkJ.      Dennie Maizes, M.D.   Electronically Signed     SK/MEDQ  D:  07/02/2007  T:  07/03/2007  Job:  [id number]

## 2010-06-08 NOTE — Consult Note (Signed)
NAME:  Stephen [name], BELFIELD NO.:  [id number]   MEDICAL RECORD NO.:  [id number]          PATIENT TYPE:  OBV   LOCATION:  A216                          FACILITY:  [facility]   PHYSICIAN:  Madolyn Frieze. Jens Som, MD, FACCDATE OF BIRTH:  05-May-1935   DATE OF CONSULTATION:  03/06/2008  DATE OF DISCHARGE:                                 CONSULTATION   PRIMARY CARE PHYSICIAN:  Dr. Pollyann Kennedy in [place], [place].   UROLOGIST:  Dennie Maizes, M.D.   REASON FOR CONSULTATION:  Chest pain.   HISTORY OF PRESENT ILLNESS:  Stephen [name] is a 75 year old male with a  history of hypertension and hyperlipidemia who recently changed from  Benicar to hydralazine secondary to elevated creatinine.  He developed  left facial and arm numbness and tingling a few days ago that lasted for  about an hour.  He contacted his primary care physician and there were  some concerns that he had side effects to the hydralazine and this was  discontinued.  His symptoms occurred again yesterday and he noted left  upper quadrant pain/pressure that did radiate up into his lower ribs on  the left.  He denied any associated shortness of breath, nausea,  diaphoresis.  His left face and arm symptoms resolved.  He denies any  difficulty with speech or difficulty with moving his extremities.  He  notes relief of left upper quadrant pain and lower extremity chest pain  with bowel movement x3 yesterday.  He denies any history of exertional  chest discomfort, shortness of breath.  He denies orthopnea, PND or  pedal edema.   PAST MEDICAL HISTORY:  Is as outlined above.  He does have a history of  nephrolithiasis, benign prostatic hypertrophy, cataract surgery in the  past.   ALLERGIES:  LEVAQUIN.   His Benicar was discontinued secondary to increased creatinine recently.  He also has Lipitor discontinued around the same time.   MEDICATIONS AT HOME:  1. Hydralazine 25 mg b.i.d. - this was recently discontinued.  2. Niaspan  500 mg q.h.s..  3. Multivitamin.  4. Potassium citrate 500 mg three times a day.  5. Vitamin C.  6. Vitamin E.  7. Fish oil.  8. Flax seed oil.  9. Evening primrose.  10.B12.  11.B6.  12.Folic acid.   SOCIAL HISTORY:  The patient lives in [place] by himself.  He is  retired from [employer]  He denies tobacco or alcohol abuse.   FAMILY HISTORY:  Significant for CAD.  His father died of a myocardial  infarction in his 12s.   REVIEW OF SYSTEMS:  Please see the HPI.  He denies fevers, chills,  headache, dysuria, hematuria, bright red blood per rectum or melena,  nausea, vomiting, diarrhea, dysphagia, syncope, near-syncope, cough,  edema, palpitations.  Rest of the review of systems are negative.   PHYSICAL EXAM:  GENERAL:  He is a well-nourished, well-developed male in  no acute distress.  VITAL SIGNS:  Blood pressure is 148/75, pulse 68, respirations 18,  temperature 98.4, oxygen saturation 98% on room air.  HEENT:  Normal.  NECK:  Without JVD.  LYMPHS:  Without lymphadenopathy.  ENDOCRINE:  Without thyromegaly.  CARDIAC:  Normal S1, S2.  Regular rate and rhythm without murmur.  LUNGS:  Clear to auscultation bilaterally.  ABDOMEN:  Soft, nontender with normoactive bowel sounds.  No  organomegaly.  EXTREMITIES:  Without clubbing, cyanosis or edema.  MUSCULOSKELETAL:  Without joint deformity.  NEUROLOGIC:  He is alert and oriented x3.  Cranial nerves II-XII are  grossly intact.  VASCULAR:  Without carotid bruits bilaterally.  SKIN:  Warm and dry.   Chest x-ray no active lung disease.  EKG sinus rhythm, heart rate of 60,  normal axis.   LABS:  Hemoglobin 14.4, potassium 4.4, creatinine 1.31, INR 1.0.  Cardiac markers negative x2.   ASSESSMENT:  1. Transient left facial and arm paresthesias.  2. Atypical left lower chest pain.  3. Hypertension.  4. Hyperlipidemia.  5. History of mild renal insufficiency.  6. Remote family history of coronary artery disease.    RECOMMENDATIONS:  The patient was also interviewed and examined by Dr.  Olga Millers.  The patient presents with atypical chest symptoms.  He  has also had some left facial and arm paresthesias.  His EKG has no  significant changes.  His enzymes thus far have been negative.  At this  point we do not think left upper quadrant pain is cardiac and no further  cardiac workup is required.  His recent facial and arm symptoms are  concerning for possible transient ischemic attack.  An echocardiogram  will be obtained as well as carotid Dopplers.  We would recommend  possibly having the patient seen by neurology but will leave this up to  his primary service.  He will be placed on amlodipine for blood  pressure.  He should continue on aspirin.   Thank you very much for the consultation.  We will be glad to follow the  patient as needed throughout the remainder of this admission.      Tereso Newcomer, PA-C      Madolyn Frieze. Jens Som, MD, [facility]  Electronically Signed    SW/MEDQ  D:  03/06/2008  T:  03/06/2008  Job:  [id number]   cc:   Osvaldo Shipper, MD

## 2010-10-15 LAB — DIFFERENTIAL
Basophils Relative: 0
Eosinophils Absolute: 0.1
Monocytes Absolute: 0.5
Monocytes Relative: 5

## 2010-10-15 LAB — URINALYSIS, ROUTINE W REFLEX MICROSCOPIC
Ketones, ur: 40 — AB
Nitrite: NEGATIVE
Protein, ur: NEGATIVE
pH: 5.5

## 2010-10-15 LAB — CBC
Hemoglobin: 14.3
Platelets: 156
RDW: 12.7

## 2010-10-15 LAB — COMPREHENSIVE METABOLIC PANEL
ALT: 30
AST: 21
Albumin: 3.9
Alkaline Phosphatase: 82
Potassium: 4.3
Sodium: 135
Total Protein: 6.4

## 2010-10-19 LAB — BASIC METABOLIC PANEL
BUN: 16
Calcium: 8.9
GFR calc non Af Amer: >60
Glucose, Bld: 136 — ABNORMAL HIGH
Potassium: 4.1

## 2010-10-21 LAB — HEMOGLOBIN AND HEMATOCRIT, BLOOD: Hemoglobin: 13.4

## 2010-10-21 LAB — BASIC METABOLIC PANEL
BUN: 12
Chloride: 105
Creatinine, Ser: 1.09
Glucose, Bld: 127 — ABNORMAL HIGH

## 2011-01-20 ENCOUNTER — Encounter: Payer: Self-pay | Admitting: Cardiology

## 2011-02-09 ENCOUNTER — Ambulatory Visit (INDEPENDENT_AMBULATORY_CARE_PROVIDER_SITE_OTHER): Payer: Medicare Other | Admitting: Internal Medicine

## 2011-02-09 ENCOUNTER — Encounter: Payer: Self-pay | Admitting: Internal Medicine

## 2011-02-09 VITALS — BP 134/80 | HR 60 | Ht 68.0 in | Wt 186.0 lb

## 2011-02-09 DIAGNOSIS — Z860100 Personal history of colon polyps, unspecified: Secondary | ICD-10-CM

## 2011-02-09 DIAGNOSIS — F458 Other somatoform disorders: Secondary | ICD-10-CM | POA: Diagnosis not present

## 2011-02-09 DIAGNOSIS — K219 Gastro-esophageal reflux disease without esophagitis: Secondary | ICD-10-CM

## 2011-02-09 DIAGNOSIS — Z800 Family history of malignant neoplasm of digestive organs: Secondary | ICD-10-CM

## 2011-02-09 DIAGNOSIS — R0989 Other specified symptoms and signs involving the circulatory and respiratory systems: Secondary | ICD-10-CM

## 2011-02-09 MED ORDER — PEG-KCL-NACL-NASULF-NA ASC-C 100 G PO SOLR: 1.0000 | Freq: Once | ORAL | Status: DC

## 2011-02-09 NOTE — Patient Instructions (Addendum)
You have been scheduled for an endoscopy and colonoscopy with propofol. Please follow the written instructions given to you at your visit today. Please pick up yourprep at the pharmacy within the next 2-3 days.  

## 2011-02-09 NOTE — Progress Notes (Signed)
HISTORY OF PRESENT ILLNESS:  Stephen [name] is a 76 y.o. male with hypertension, hyperlipidemia, kidney stones, arthritis, and adenomatous colon polyps. (Multiple and advanced). He presents today regarding surveillance colonoscopy. As well, the need for upper endoscopy. He underwent his index colonoscopy elsewhere, but previous colonoscopies here in 2009, 2010, and most recently March of 2011. His most recent exam revealed multiple adenomas with followup in 2 years recommended. His brother was recently diagnosed with colon cancer, after having a history of rectal cancer. The patient currently denies any lower GI symptoms except for hemorrhoids. Upper GI symptoms include intermittent problems with reflux, globus sensation, and chronic throat clearing behavior. Pull previously by ENT that he had GERD. Has not had screening upper endoscopy. Chronic medical problems are stable.  REVIEW OF SYSTEMS:  All non-GI ROS negative except for arthritis and sinus and allergy troubles  Past Medical History  Diagnosis Date  . Hypertension   . Hyperlipidemia   . Chest pain   . TIA (transient ischemic attack)   . CKD (chronic kidney disease)   . BPH (benign prostatic hypertrophy)   . Kidney stones   . UTI (lower urinary tract infection)   . Arthritis   . Adenomatous colon polyp   . GERD (gastroesophageal reflux disease)   . Hemorrhoids     Past Surgical History  Procedure Date  . Cataract extraction   . Lithotripsy     Social History Stephen [name]  reports that he has never smoked. He has never used smokeless tobacco. He reports that he does not drink alcohol or use illicit drugs.  family history includes Colon polyps in his brother; Heart attack in his father; Heart disease in his brother; Lung cancer in his brother; and Pancreatic cancer in his brother.  There is no history of Colon cancer.  Allergies  Allergen Reactions  . Olmesartan Medoxomil     REACTION: progressive renal insufficiency         PHYSICAL EXAMINATION: Vital signs: BP 134/80  Pulse 60  Ht 5\' 8"  (1.727 m)  Wt 186 lb (84.369 kg)  BMI 28.28 kg/m2  Constitutional: generally well-appearing, no acute distress Psychiatric: alert and oriented x3, cooperative Eyes: extraocular movements intact, anicteric, conjunctiva pink Mouth: oral pharynx moist, no lesions Neck: supple no lymphadenopathy Cardiovascular: heart regular rate and rhythm, no murmur Lungs: clear to auscultation bilaterally Abdomen: soft, nontender, nondistended, no obvious ascites, no peritoneal signs, normal bowel sounds, no organomegaly Rectal:deferred until colonoscopy Extremities: no lower extremity edema bilaterally Skin: no lesions on visible extremities Neuro: No focal deficits.   ASSESSMENT:  #1. Chronic GERD. Globus sensation. #2. History of multiple and advanced adenomatous colon polyps. Due for surveillance. #3. Brother with metachronous colon cancer #4. General medical problems stable   PLAN:  #1. Reflux precautions #2.Diagnostic upper endoscopy. Rule out Barrett's, and evaluate globus complaint.The nature of the procedure, as well as the risks, benefits, and alternatives were carefully and thoroughly reviewed with the patient. Ample time for discussion and questions allowed. The patient understood, was satisfied, and agreed to proceed.  #3. Surveillance colonoscopy.The nature of the procedure, as well as the risks, benefits, and alternatives were carefully and thoroughly reviewed with the patient. Ample time for discussion and questions allowed. The patient understood, was satisfied, and agreed to proceed. Movi prep prescribed. The patient instructed on its use.

## 2011-02-14 DIAGNOSIS — J9600 Acute respiratory failure, unspecified whether with hypoxia or hypercapnia: Secondary | ICD-10-CM | POA: Diagnosis not present

## 2011-02-14 DIAGNOSIS — I4892 Unspecified atrial flutter: Secondary | ICD-10-CM | POA: Diagnosis not present

## 2011-02-23 ENCOUNTER — Encounter: Payer: Self-pay | Admitting: Internal Medicine

## 2011-03-08 ENCOUNTER — Ambulatory Visit (AMBULATORY_SURGERY_CENTER): Payer: Medicare Other | Admitting: Internal Medicine

## 2011-03-08 ENCOUNTER — Encounter: Payer: Self-pay | Admitting: Internal Medicine

## 2011-03-08 VITALS — BP 154/70 | HR 65 | Temp 96.5°F | Resp 16 | Ht 68.0 in | Wt 186.0 lb

## 2011-03-08 DIAGNOSIS — K219 Gastro-esophageal reflux disease without esophagitis: Secondary | ICD-10-CM | POA: Diagnosis not present

## 2011-03-08 DIAGNOSIS — F411 Generalized anxiety disorder: Secondary | ICD-10-CM | POA: Diagnosis not present

## 2011-03-08 DIAGNOSIS — K2900 Acute gastritis without bleeding: Secondary | ICD-10-CM

## 2011-03-08 DIAGNOSIS — Z1211 Encounter for screening for malignant neoplasm of colon: Secondary | ICD-10-CM | POA: Diagnosis not present

## 2011-03-08 DIAGNOSIS — D126 Benign neoplasm of colon, unspecified: Secondary | ICD-10-CM | POA: Diagnosis not present

## 2011-03-08 DIAGNOSIS — Z860100 Personal history of colon polyps, unspecified: Secondary | ICD-10-CM

## 2011-03-08 MED ORDER — SODIUM CHLORIDE 0.9 % IV SOLN: 500.0000 mL | INTRAVENOUS | Status: DC

## 2011-03-08 NOTE — Op Note (Signed)
[facility] [address] [place], [place]  [address]  COLONOSCOPY PROCEDURE REPORT  PATIENT:  Stephen [name], Stephen [name]  MR#:  [id number] BIRTHDATE:  1935-07-02, 76 yrs. old  GENDER:  male ENDOSCOPIST:  Wilhemina Bonito. Eda Keys, MD REF. BY:  Surveillance Program Recall, PROCEDURE DATE:  03/08/2011 PROCEDURE:  Colonoscopy with snare polypectomy x 1 ASA CLASS:  Class II INDICATIONS:  history of pre-cancerous (adenomatous) colon polyps, colorectal cancer screening, above average risk, family history of colon cancer (brother); INDEX 2-09 (multiple and advanced); f/u 10-09, 2-10, and 3-11 w/ multiple adenomas MEDICATIONS:   MAC sedation, administered by CRNA, propofol (Diprivan) 150 mg IV  DESCRIPTION OF PROCEDURE:   After the risks benefits and alternatives of the procedure were thoroughly explained, informed consent was obtained.  Digital rectal exam was performed and revealed no abnormalities.   The LB CF-H180AL [id number] endoscope was introduced through the anus and advanced to the cecum, which was identified by both the appendix and ileocecal valve, without limitations.  The quality of the prep was excellent, using MoviPrep.  The instrument was then slowly withdrawn as the colon was fully examined. <<PROCEDUREIMAGES>>  FINDINGS:  A 5mm sessile polyp was found in the cecum and snared without cautery. Retrieval was successful.  Moderate diverticulosis was found in the sigmoid colon.  Otherwise normal colonoscopy without other polyps, masses, vascular ectasias, or inflammatory changes.   Retroflexed views in the rectum revealed internal hemorrhoids.    The time to cecum = 2:54  minutes. The scope was then withdrawn in 11:52  minutes from the cecum and the procedure completed.  COMPLICATIONS:  None  ENDOSCOPIC IMPRESSION: 1) Sessile polyp in the cecum - removed 2) Moderate diverticulosis in the sigmoid colon 3) Otherwise normal colonoscopy 4) Internal  hemorrhoids  RECOMMENDATIONS: 1) Repeat Colonoscopy in 3 years.  ______________________________ Wilhemina Bonito. Eda Keys, MD  CC:  The Patient;  Dwana Melena MD  n. eSIGNED:   Wilhemina Bonito. Eda Keys at 03/08/2011 02:41 PM  Barry Dienes, [phone]

## 2011-03-08 NOTE — Progress Notes (Signed)
Patient did not have preoperative order for IV antibiotic SSI prophylaxis. (G8918)  Patient did not experience any of the following events: a burn prior to discharge; a fall within the facility; wrong site/side/patient/procedure/implant event; or a hospital transfer or hospital admission upon discharge from the facility. (G8907)  

## 2011-03-08 NOTE — Op Note (Signed)
[facility] [address] [place], [place]  [address]  ENDOSCOPY PROCEDURE REPORT  PATIENT:  Stephen [name], Stephen [name]  MR#:  [id number] BIRTHDATE:  [birth year], 76 yrs. old  GENDER:  male  ENDOSCOPIST:  Wilhemina Bonito. Eda Keys, MD Referred by:  Office / Self  PROCEDURE DATE:  03/08/2011 PROCEDURE:  EGD with biopsy, 43239 ASA CLASS:  Class II INDICATIONS:  GERD, globus  MEDICATIONS:   MAC sedation, administered by CRNA, propofol (Diprivan) 50 mg IV TOPICAL ANESTHETIC:  none  DESCRIPTION OF PROCEDURE:   After the risks benefits and alternatives of the procedure were thoroughly explained, informed consent was obtained.  The LB GIF-H180 [id number] endoscope was introduced through the mouth and advanced to the second portion of the duodenum, without limitations.  The instrument was slowly withdrawn as the mucosa was fully examined. <<PROCEDUREIMAGES>>  The esophagus and gastroesophageal junction were completely normal in appearance.  Multiple erosions were found in the antrum. CLO bx taken. Otherwise the examination was normal to D2.    Retroflexed views revealed no abnormalities.    The scope was then withdrawn from the patient and the procedure completed.  COMPLICATIONS:  None  ENDOSCOPIC IMPRESSION: 1) Normal esophagus 2) Erosions, multiple in the antrum 3) Otherwise normal examination  RECOMMENDATIONS: 1) Rx CLO if positive 2) PRILOSEC OTC (OR GENERIC OMEPRAZOLE) 20 MG DAILY FOR 8 WEEKS 3) FOLLOW UP PRN  ______________________________ Wilhemina Bonito. Eda Keys, MD  CC:  Dwana Melena MD; The Patient  n. eSIGNED:   Wilhemina Bonito. Eda Keys at 03/08/2011 02:48 PM  Barry Dienes, [phone]

## 2011-03-09 ENCOUNTER — Telehealth: Payer: Self-pay | Admitting: *Deleted

## 2011-03-09 LAB — HELICOBACTER PYLORI SCREEN-BIOPSY: UREASE: POSITIVE

## 2011-03-09 NOTE — Telephone Encounter (Signed)
   Follow up Call-  Call back number 03/08/2011  Post procedure Call Back phone  # [phone] hm  Permission to leave phone message Yes     Patient questions:  Do you have a fever, pain , or abdominal swelling? no Pain Score  0 *  Have you tolerated food without any problems? yes  Have you been able to return to your normal activities? yes  Do you have any questions about your discharge instructions: Diet   no Medications  no Follow up visit  no  Do you have questions or concerns about your Care? no  Actions: * If pain score is 4 or above: No action needed, pain <4.

## 2011-03-10 ENCOUNTER — Telehealth: Payer: Self-pay

## 2011-03-10 MED ORDER — AMOXICILLIN 500 MG PO CAPS: 500.0000 mg | ORAL_CAPSULE | Freq: Two times a day (BID) | ORAL | Status: AC

## 2011-03-10 MED ORDER — OMEPRAZOLE 20 MG PO CPDR: DELAYED_RELEASE_CAPSULE | ORAL | Status: DC

## 2011-03-10 MED ORDER — CLARITHROMYCIN 500 MG PO TABS: 500.0000 mg | ORAL_TABLET | Freq: Two times a day (BID) | ORAL | Status: AC

## 2011-03-10 NOTE — Telephone Encounter (Signed)
Pt aware and rx sent to pharmacy. 

## 2011-03-10 NOTE — Telephone Encounter (Signed)
Message copied by Michele Mcalpine on Thu Mar 10, 2011  8:08 AM ------      Message from: Hilarie Fredrickson      Created: Wed Mar 09, 2011  7:07 PM       Please let patient know that his H pylori test is +.  Treat with omeprazole 20 mg bid x 2 weeks; amoxicillin 1000 mg bid x 2 weeks; and clarithromycin 500 mg bid x 2 weeks. Then complete 8 weeks of omeprazole 20 mg daily (already prescribed). Double check allergies. Thanks

## 2011-03-15 ENCOUNTER — Encounter: Payer: Self-pay | Admitting: Internal Medicine

## 2011-03-16 ENCOUNTER — Telehealth: Payer: Self-pay | Admitting: Internal Medicine

## 2011-03-16 NOTE — Telephone Encounter (Signed)
Pt states he has had a headache and both the omeprazole and biaxin state it can be side effects. Pt wanted to know what he could take for the headache. Pt states he has taken advil and it helped. Spoke with pt and let him know the advil or tylenol would be fine for him to take for the headache.

## 2011-03-18 ENCOUNTER — Telehealth: Payer: Self-pay | Admitting: Internal Medicine

## 2011-03-18 NOTE — Telephone Encounter (Signed)
Pt called to let Dr. Marina Goodell know that he broke out in a rash and was having headaches from the Biaxin. He went to his PCP and he changed him to Flagyl. Pt states he had problems with Biaxin before causing bad headaches.

## 2011-03-18 NOTE — Telephone Encounter (Signed)
Noted. He did not mention any problems with Biaxin at the time of his initial evaluation. We have updated his allergies/medication intolerance list.

## 2011-03-23 ENCOUNTER — Telehealth: Payer: Self-pay

## 2011-03-23 NOTE — Telephone Encounter (Signed)
Pt called to let us know that he has stopped taking the medication that we gave him for H Pylori. The Biaxin was causing bad headaches. His PCP placed him on Metronidazole but the pt states that after 2 doses of this drug he developed bad headaches along with eye pain and unsteadiness. He called his PCP office back but he wanted to let us know he is not taking any of the medications now. Pt states he is waiting for he PCP office to return his call.

## 2011-03-23 NOTE — Telephone Encounter (Signed)
Noted. No new recommendations. 

## 2011-04-12 DIAGNOSIS — E785 Hyperlipidemia, unspecified: Secondary | ICD-10-CM | POA: Diagnosis not present

## 2011-04-12 DIAGNOSIS — I10 Essential (primary) hypertension: Secondary | ICD-10-CM | POA: Diagnosis not present

## 2011-04-12 DIAGNOSIS — E119 Type 2 diabetes mellitus without complications: Secondary | ICD-10-CM | POA: Diagnosis not present

## 2011-05-26 DIAGNOSIS — J209 Acute bronchitis, unspecified: Secondary | ICD-10-CM | POA: Diagnosis not present

## 2011-07-06 DIAGNOSIS — H4010X0 Unspecified open-angle glaucoma, stage unspecified: Secondary | ICD-10-CM | POA: Diagnosis not present

## 2011-07-06 DIAGNOSIS — H04129 Dry eye syndrome of unspecified lacrimal gland: Secondary | ICD-10-CM | POA: Diagnosis not present

## 2011-07-14 DIAGNOSIS — G459 Transient cerebral ischemic attack, unspecified: Secondary | ICD-10-CM | POA: Diagnosis not present

## 2011-07-14 DIAGNOSIS — E785 Hyperlipidemia, unspecified: Secondary | ICD-10-CM | POA: Diagnosis not present

## 2011-07-14 DIAGNOSIS — I10 Essential (primary) hypertension: Secondary | ICD-10-CM | POA: Diagnosis not present

## 2011-07-14 DIAGNOSIS — E119 Type 2 diabetes mellitus without complications: Secondary | ICD-10-CM | POA: Diagnosis not present

## 2011-08-02 ENCOUNTER — Ambulatory Visit (HOSPITAL_COMMUNITY)
Admission: RE | Admit: 2011-08-02 | Discharge: 2011-08-02 | Disposition: A | Discharge status: Home / Self Care | Payer: Medicare Other | Source: Ambulatory Visit | Attending: Urology | Admitting: Urology

## 2011-08-02 ENCOUNTER — Other Ambulatory Visit: Payer: Self-pay | Admitting: Urology

## 2011-08-02 DIAGNOSIS — Z87442 Personal history of urinary calculi: Secondary | ICD-10-CM | POA: Diagnosis not present

## 2011-08-02 DIAGNOSIS — R109 Unspecified abdominal pain: Secondary | ICD-10-CM | POA: Insufficient documentation

## 2011-08-02 DIAGNOSIS — N200 Calculus of kidney: Secondary | ICD-10-CM

## 2011-08-05 ENCOUNTER — Ambulatory Visit (INDEPENDENT_AMBULATORY_CARE_PROVIDER_SITE_OTHER): Payer: Medicare Other | Admitting: Urology

## 2011-08-05 DIAGNOSIS — Z87442 Personal history of urinary calculi: Secondary | ICD-10-CM | POA: Diagnosis not present

## 2011-08-05 DIAGNOSIS — N200 Calculus of kidney: Secondary | ICD-10-CM | POA: Diagnosis not present

## 2011-08-20 ENCOUNTER — Encounter (HOSPITAL_COMMUNITY): Payer: Self-pay

## 2011-08-20 ENCOUNTER — Emergency Department (HOSPITAL_COMMUNITY): Payer: Medicare Other

## 2011-08-20 ENCOUNTER — Emergency Department (HOSPITAL_COMMUNITY)
Admission: EM | Admit: 2011-08-20 | Discharge: 2011-08-20 | Disposition: A | Discharge status: Home / Self Care | Payer: Medicare Other | Attending: Emergency Medicine | Admitting: Emergency Medicine

## 2011-08-20 DIAGNOSIS — Z7982 Long term (current) use of aspirin: Secondary | ICD-10-CM | POA: Diagnosis not present

## 2011-08-20 DIAGNOSIS — N189 Chronic kidney disease, unspecified: Secondary | ICD-10-CM | POA: Insufficient documentation

## 2011-08-20 DIAGNOSIS — Z79899 Other long term (current) drug therapy: Secondary | ICD-10-CM | POA: Diagnosis not present

## 2011-08-20 DIAGNOSIS — R110 Nausea: Secondary | ICD-10-CM | POA: Insufficient documentation

## 2011-08-20 DIAGNOSIS — R42 Dizziness and giddiness: Secondary | ICD-10-CM | POA: Diagnosis not present

## 2011-08-20 DIAGNOSIS — Z8673 Personal history of transient ischemic attack (TIA), and cerebral infarction without residual deficits: Secondary | ICD-10-CM | POA: Diagnosis not present

## 2011-08-20 DIAGNOSIS — I129 Hypertensive chronic kidney disease with stage 1 through stage 4 chronic kidney disease, or unspecified chronic kidney disease: Secondary | ICD-10-CM | POA: Diagnosis not present

## 2011-08-20 DIAGNOSIS — E785 Hyperlipidemia, unspecified: Secondary | ICD-10-CM | POA: Insufficient documentation

## 2011-08-20 DIAGNOSIS — R197 Diarrhea, unspecified: Secondary | ICD-10-CM | POA: Diagnosis not present

## 2011-08-20 LAB — BASIC METABOLIC PANEL
Calcium: 8.6 mg/dL (ref 8.4–10.5)
GFR calc Af Amer: 71 mL/min — ABNORMAL LOW (ref >90)
GFR calc non Af Amer: 61 mL/min — ABNORMAL LOW (ref >90)
Potassium: 3.5 mEq/L (ref 3.5–5.1)
Sodium: 137 mEq/L (ref 135–145)

## 2011-08-20 LAB — CBC WITH DIFFERENTIAL/PLATELET
Basophils Absolute: 0 10*3/uL (ref 0.0–0.1)
Basophils Relative: 0 % (ref 0–1)
Eosinophils Absolute: 0.2 10*3/uL (ref 0.0–0.7)
Eosinophils Relative: 2 % (ref 0–5)
MCH: 32.8 pg (ref 26.0–34.0)
MCV: 92.3 fL (ref 78.0–100.0)
Neutrophils Relative %: 38 % — ABNORMAL LOW (ref 43–77)
Platelets: 176 10*3/uL (ref 150–400)
RBC: 4.7 MIL/uL (ref 4.22–5.81)
RDW: 12.5 % (ref 11.5–15.5)

## 2011-08-20 LAB — TROPONIN I: Troponin I: <0.3 ng/mL (ref <0.30)

## 2011-08-20 MED ORDER — LORAZEPAM 1 MG PO TABS: 1.0000 mg | ORAL_TABLET | Freq: Three times a day (TID) | ORAL | Status: AC | PRN

## 2011-08-20 MED ORDER — SODIUM CHLORIDE 0.9 % IV BOLUS (SEPSIS)
1000.0000 mL | Freq: Once | INTRAVENOUS | Status: AC
  Administered 2011-08-20: 1000 mL via INTRAVENOUS

## 2011-08-20 MED ORDER — MECLIZINE HCL 12.5 MG PO TABS
25.0000 mg | ORAL_TABLET | Freq: Once | ORAL | Status: AC
  Administered 2011-08-20: 25 mg via ORAL
  Filled 2011-08-20: qty 2
  Filled 2011-08-20: qty 1

## 2011-08-20 MED ORDER — MECLIZINE HCL 25 MG PO TABS: 25.0000 mg | ORAL_TABLET | Freq: Four times a day (QID) | ORAL | Status: AC | PRN

## 2011-08-20 MED ORDER — LORAZEPAM 1 MG PO TABS
1.0000 mg | ORAL_TABLET | Freq: Once | ORAL | Status: AC
  Administered 2011-08-20: 1 mg via ORAL
  Filled 2011-08-20: qty 1

## 2011-08-20 NOTE — ED Provider Notes (Signed)
History    This chart was scribed for Donnetta Hutching, MD, MD by Smitty Pluck. The patient was seen in room APA19 and the patient's care was started at 9:17AM.   CSN: [id number]  Arrival date & time 08/20/11  4098   First MD Initiated Contact with Patient 08/20/11 [phone]      Chief Complaint  Patient presents with  . Dizziness  . Diarrhea  . Nausea    (Consider location/radiation/quality/duration/timing/severity/associated sxs/prior treatment) Patient is a 76 y.o. male presenting with diarrhea. The history is provided by the patient.  Diarrhea The primary symptoms include diarrhea.   Stephen [name] is a 76 y.o. male who presents to the Emergency Department complaining of moderate dizziness and diarrhea onset today this AM. Pt reports that he went to bathroom and his head was "spinning." He reports having bowel movement 2x PTA and 1x while in ED. Pt reports that he has nausea. He had diaphoresis and his gait was wobbly during onset this AM. He reports that he is feeling better currently but still has mild dizziness when standing. He reports that he has had irregular heart beat recently and that he checked his BP today that was 181/110 (baseline 120-130/70). He reports that he had viral cold and congestion 6 days ago. Pt reports that he has taken sudafed since Sunday. He saw PCP and had normal lab results 1 month ago. PCP is Dr. Margo Aye  Past Medical History  Diagnosis Date  . Hypertension   . Hyperlipidemia   . Chest pain   . TIA (transient ischemic attack)   . CKD (chronic kidney disease)   . BPH (benign prostatic hypertrophy)   . Kidney stones   . UTI (lower urinary tract infection)   . Arthritis   . Adenomatous colon polyp   . GERD (gastroesophageal reflux disease)   . Hemorrhoids   . Allergy   . Cataract     bil cataracts removed    Past Surgical History  Procedure Date  . Cataract extraction   . Lithotripsy     Family History  Problem Relation Age of Onset  . Heart  attack Father   . Pancreatic cancer Brother   . Colon polyps Brother   . Lung cancer Brother   . Heart disease Brother   . Colon cancer Brother   . Esophageal cancer Neg Hx   . Stomach cancer Neg Hx     History  Substance Use Topics  . Smoking status: Never Smoker   . Smokeless tobacco: Never Used  . Alcohol Use: No      Review of Systems  Gastrointestinal: Positive for diarrhea.  All other systems reviewed and are negative.   10 Systems reviewed and all are negative for acute change except as noted in the HPI.   Allergies  Levofloxacin; Metronidazole; Olmesartan medoxomil; and Clarithromycin  Home Medications   Current Outpatient Rx  Name Route Sig Dispense Refill  . ALOE VERA PO Oral Take by mouth. 400 mg twice daily by mouth    . ALPHA-LIPOIC ACID 50 MG PO TABS Oral Take 1 tablet by mouth 2 (two) times daily.    Marland Kitchen AMLODIPINE BESYLATE 10 MG PO TABS Oral Take 5 mg by mouth daily.     Marland Kitchen VITAMIN C 1000 MG PO TABS Oral Take 1,000 mg by mouth 2 (two) times daily.    . ASPIRIN 81 MG PO TABS Oral Take 160 mg by mouth daily.    Marland Kitchen VITAMIN D3 2000 UNITS PO  TABS Oral Take 1 capsule by mouth 2 (two) times daily.    . CHROMIUM PICOLINATE 200 MCG PO TABS Oral Take 1 tablet by mouth 2 (two) times daily.      . CO Q 10 100 MG PO CAPS Oral Take 1 capsule by mouth 2 (two) times daily.    Marland Kitchen VITAMIN B-12 1000 MCG SL SUBL Sublingual Place under the tongue. Takes 3000 mcg once daily    . DULCOLAX STOOL SOFTENER PO Oral Take by mouth. Takes twice daily    . FLAX PO OIL Oral Take 1 capsule by mouth daily.     Marland Kitchen GYMNEMA SYLVESTRIS LEAF POWD Oral Take by mouth as directed.    Marland Kitchen KRILL OIL 1000 MG PO CAPS Oral Take 1 capsule by mouth daily.      Marland Kitchen MAGNESIUM 200 MG PO TABS Oral Take 1 tablet by mouth 2 (two) times daily.      Marland Kitchen ONE-DAILY MULTI VITAMINS PO TABS Oral Take 1 tablet by mouth daily.      Marland Kitchen NIACIN ER (ANTIHYPERLIPIDEMIC) 1000 MG PO TBCR Oral Take 1,000 mg by mouth at bedtime.      .  OMEPRAZOLE 20 MG PO CPDR  Take 1 pill two times daily for 2 weeks. Then take 1 pill daily for 8 weeks. 85 capsule 1  . PROBIOTIC FORMULA PO CAPS Oral Take 1 capsule by mouth daily.      . PSYLLIUM 48.57 % PO POWD Oral Take 3 g by mouth 2 (two) times daily.      Marland Kitchen ROSUVASTATIN CALCIUM 5 MG PO TABS Oral Take 2.5 mg by mouth daily.      Marland Kitchen VITAMIN E 400 UNITS PO CAPS Oral Take 400 Units by mouth 2 (two) times daily.      Marland Kitchen VITAMIN K 100 MCG PO TABS Oral Take 100 mcg by mouth 2 (two) times daily.        BP 189/88  Pulse 66  Temp 97.8 F (36.6 C) (Oral)  Resp 20  SpO2 100%  Physical Exam  Nursing note and vitals reviewed. Constitutional: He is oriented to person, place, and time. He appears well-developed and well-nourished.  HENT:  Head: Normocephalic and atraumatic.  Eyes: Conjunctivae and EOM are normal. Pupils are equal, round, and reactive to light.  Neck: Normal range of motion. Neck supple.  Cardiovascular: Normal rate and regular rhythm.   Pulmonary/Chest: Effort normal and breath sounds normal.  Abdominal: Soft. Bowel sounds are normal.  Musculoskeletal: Normal range of motion.  Neurological: He is alert and oriented to person, place, and time.  Skin: Skin is warm and dry.  Psychiatric: He has a normal mood and affect.    ED Course  Procedures (including critical care time) DIAGNOSTIC STUDIES: Oxygen Saturation is 100% on room air, normal by my interpretation.    COORDINATION OF CARE: 9:00AM EDP ordered medication:  Scheduled Meds:    . LORazepam  1 mg Oral Once  . meclizine  25 mg Oral Once  . sodium chloride  1,000 mL Intravenous Once   Continuous Infusions:  PRN Meds:.  9:23AM Pt reports having dizziness while standing during examination.  9:24AM EDP discusses pt ED treatment with pt (Medication for vertigo: antivert and ativan. EKG and blood labs)   Labs Reviewed  CBC WITH DIFFERENTIAL - Abnormal; Notable for the following:    Neutrophils Relative 38 (*)      Lymphocytes Relative 53 (*)     All other components within normal limits  BASIC METABOLIC PANEL - Abnormal; Notable for the following:    Glucose, Bld 174 (*)     GFR calc non Af Amer 61 (*)     GFR calc Af Amer 71 (*)     All other components within normal limits  TROPONIN I   Ct Head Wo Contrast  08/20/2011  *RADIOLOGY REPORT*  Clinical Data: Dizziness, diarrhea  CT HEAD WITHOUT CONTRAST  Technique:  Contiguous axial images were obtained from the base of the skull through the vertex without contrast.  Comparison: 03/06/2008  Findings:   No skull fracture is noted.  Paranasal sinuses and mastoid air cells are unremarkable.  No intracranial hemorrhage, mass effect or midline shift.  No acute infarction.  Stable mild cerebral atrophy.  No mass lesion is noted on this unenhanced scan. Stable mild periventricular chronic white matter disease.  IMPRESSION: No acute intracranial abnormality.  No significant change.  Original Report Authenticated By: Natasha Mead, M.D.     No diagnosis found.   Date: 08/20/2011  Rate: 59  Rhythm: sinus bradycardia  QRS Axis: normal  Intervals: normal  ST/T Wave abnormalities: normal  Conduction Disutrbances:none  Narrative Interpretation:   Old EKG Reviewed: changes noted c SA  MDM  Patient presents with symptom complex suggestive of vertigo. Feels much better after by mouth Ativan and Antivert. CT head negative. Monitor shows normal sinus rhythm with a sinus arrhythmia.  Patient is ambulatory without ataxia.  Patient will follow up with his primary care doctor for arrhythmia   I personally performed the services described in this documentation, which was scribed in my presence. The recorded information has been reviewed and considered.       Donnetta Hutching, MD 08/20/11 1321

## 2011-08-20 NOTE — ED Notes (Signed)
Ok to eat per Dr.Cook. Tray given to pt

## 2011-08-20 NOTE — ED Notes (Signed)
Dr. Adriana Simas in room to examine pt

## 2011-08-20 NOTE — ED Notes (Signed)
Pt resting in bed states, " I feel better now" pt up and ambulated to bathroom and denies dizziness

## 2011-08-20 NOTE — ED Notes (Addendum)
Pt reports ate a lot of cherries last night and this am woke up feeling dizzy, nauseated, and dry mouth.  Reports has had diarrhea x 2.   Also reports has had congestion and sore throat for the past week.   Has been taking [facility] brand sudafed.

## 2011-08-22 DIAGNOSIS — R42 Dizziness and giddiness: Secondary | ICD-10-CM | POA: Diagnosis not present

## 2011-08-22 DIAGNOSIS — I10 Essential (primary) hypertension: Secondary | ICD-10-CM | POA: Diagnosis not present

## 2011-10-20 DIAGNOSIS — E119 Type 2 diabetes mellitus without complications: Secondary | ICD-10-CM | POA: Diagnosis not present

## 2011-10-20 DIAGNOSIS — I10 Essential (primary) hypertension: Secondary | ICD-10-CM | POA: Diagnosis not present

## 2011-10-20 DIAGNOSIS — E785 Hyperlipidemia, unspecified: Secondary | ICD-10-CM | POA: Diagnosis not present

## 2012-01-12 DIAGNOSIS — Z125 Encounter for screening for malignant neoplasm of prostate: Secondary | ICD-10-CM | POA: Diagnosis not present

## 2012-01-12 DIAGNOSIS — E785 Hyperlipidemia, unspecified: Secondary | ICD-10-CM | POA: Diagnosis not present

## 2012-01-12 DIAGNOSIS — E119 Type 2 diabetes mellitus without complications: Secondary | ICD-10-CM | POA: Diagnosis not present

## 2012-01-12 DIAGNOSIS — I10 Essential (primary) hypertension: Secondary | ICD-10-CM | POA: Diagnosis not present

## 2012-02-06 DIAGNOSIS — D239 Other benign neoplasm of skin, unspecified: Secondary | ICD-10-CM | POA: Diagnosis not present

## 2012-02-06 DIAGNOSIS — D485 Neoplasm of uncertain behavior of skin: Secondary | ICD-10-CM | POA: Diagnosis not present

## 2012-02-06 DIAGNOSIS — L909 Atrophic disorder of skin, unspecified: Secondary | ICD-10-CM | POA: Diagnosis not present

## 2012-02-06 DIAGNOSIS — L919 Hypertrophic disorder of the skin, unspecified: Secondary | ICD-10-CM | POA: Diagnosis not present

## 2012-02-06 DIAGNOSIS — D2330 Other benign neoplasm of skin of unspecified part of face: Secondary | ICD-10-CM | POA: Diagnosis not present

## 2012-02-06 DIAGNOSIS — L821 Other seborrheic keratosis: Secondary | ICD-10-CM | POA: Diagnosis not present

## 2012-02-06 DIAGNOSIS — L819 Disorder of pigmentation, unspecified: Secondary | ICD-10-CM | POA: Diagnosis not present

## 2012-02-06 DIAGNOSIS — L820 Inflamed seborrheic keratosis: Secondary | ICD-10-CM | POA: Diagnosis not present

## 2012-02-07 DIAGNOSIS — H4010X0 Unspecified open-angle glaucoma, stage unspecified: Secondary | ICD-10-CM | POA: Diagnosis not present

## 2012-02-21 DIAGNOSIS — E785 Hyperlipidemia, unspecified: Secondary | ICD-10-CM | POA: Diagnosis not present

## 2012-03-14 ENCOUNTER — Other Ambulatory Visit: Payer: Self-pay | Admitting: Urology

## 2012-03-14 DIAGNOSIS — R3129 Other microscopic hematuria: Secondary | ICD-10-CM | POA: Diagnosis not present

## 2012-04-25 ENCOUNTER — Ambulatory Visit (HOSPITAL_COMMUNITY)
Admission: RE | Admit: 2012-04-25 | Discharge: 2012-04-25 | Disposition: A | Discharge status: Home / Self Care | Payer: Medicare Other | Source: Ambulatory Visit | Attending: Urology | Admitting: Urology

## 2012-04-25 DIAGNOSIS — N200 Calculus of kidney: Secondary | ICD-10-CM | POA: Diagnosis not present

## 2012-04-25 DIAGNOSIS — N201 Calculus of ureter: Secondary | ICD-10-CM | POA: Diagnosis not present

## 2012-04-25 DIAGNOSIS — R3129 Other microscopic hematuria: Secondary | ICD-10-CM | POA: Diagnosis not present

## 2012-04-25 DIAGNOSIS — N139 Obstructive and reflux uropathy, unspecified: Secondary | ICD-10-CM | POA: Diagnosis not present

## 2012-04-25 LAB — POCT I-STAT, CHEM 8
Creatinine, Ser: 1.3 mg/dL (ref 0.50–1.35)
HCT: 48 % (ref 39.0–52.0)
Hemoglobin: 16.3 g/dL (ref 13.0–17.0)
Potassium: 3.9 mEq/L (ref 3.5–5.1)
Sodium: 138 mEq/L (ref 135–145)
TCO2: 26 mmol/L (ref 0–100)

## 2012-04-25 MED ORDER — IOHEXOL 300 MG/ML  SOLN
125.0000 mL | Freq: Once | INTRAMUSCULAR | Status: AC | PRN
  Administered 2012-04-25: 125 mL via INTRAVENOUS

## 2012-04-25 NOTE — Progress Notes (Signed)
Blood sample obtained from left arm IV for Creatnine level.  

## 2012-04-27 ENCOUNTER — Ambulatory Visit (INDEPENDENT_AMBULATORY_CARE_PROVIDER_SITE_OTHER): Payer: Medicare Other | Admitting: Urology

## 2012-04-27 ENCOUNTER — Other Ambulatory Visit: Payer: Self-pay | Admitting: Urology

## 2012-04-27 DIAGNOSIS — N201 Calculus of ureter: Secondary | ICD-10-CM

## 2012-04-27 DIAGNOSIS — R3129 Other microscopic hematuria: Secondary | ICD-10-CM | POA: Diagnosis not present

## 2012-04-27 DIAGNOSIS — N200 Calculus of kidney: Secondary | ICD-10-CM | POA: Diagnosis not present

## 2012-04-30 ENCOUNTER — Encounter (HOSPITAL_COMMUNITY)
Admission: RE | Admit: 2012-04-30 | Discharge: 2012-04-30 | Disposition: A | Discharge status: Home / Self Care | Payer: Medicare Other | Source: Ambulatory Visit | Attending: Urology | Admitting: Urology

## 2012-04-30 ENCOUNTER — Encounter (HOSPITAL_COMMUNITY): Payer: Self-pay

## 2012-04-30 DIAGNOSIS — E119 Type 2 diabetes mellitus without complications: Secondary | ICD-10-CM | POA: Diagnosis not present

## 2012-04-30 DIAGNOSIS — N1330 Unspecified hydronephrosis: Secondary | ICD-10-CM | POA: Diagnosis not present

## 2012-04-30 DIAGNOSIS — I10 Essential (primary) hypertension: Secondary | ICD-10-CM | POA: Diagnosis not present

## 2012-04-30 DIAGNOSIS — Z79899 Other long term (current) drug therapy: Secondary | ICD-10-CM | POA: Diagnosis not present

## 2012-04-30 DIAGNOSIS — R3129 Other microscopic hematuria: Secondary | ICD-10-CM | POA: Diagnosis not present

## 2012-04-30 DIAGNOSIS — N200 Calculus of kidney: Secondary | ICD-10-CM | POA: Diagnosis not present

## 2012-04-30 DIAGNOSIS — E7800 Pure hypercholesterolemia, unspecified: Secondary | ICD-10-CM | POA: Diagnosis not present

## 2012-04-30 DIAGNOSIS — Z01812 Encounter for preprocedural laboratory examination: Secondary | ICD-10-CM | POA: Diagnosis not present

## 2012-04-30 DIAGNOSIS — N201 Calculus of ureter: Secondary | ICD-10-CM | POA: Diagnosis not present

## 2012-04-30 HISTORY — DX: Type 2 diabetes mellitus without complications: E11.9

## 2012-04-30 HISTORY — DX: Other specified bacterial intestinal infections: A04.8

## 2012-04-30 LAB — CBC
HCT: 44.6 % (ref 39.0–52.0)
MCHC: 35 g/dL (ref 30.0–36.0)
Platelets: 196 10*3/uL (ref 150–400)
RDW: 12.5 % (ref 11.5–15.5)
WBC: 7.9 10*3/uL (ref 4.0–10.5)

## 2012-04-30 LAB — BASIC METABOLIC PANEL
BUN: 16 mg/dL (ref 6–23)
GFR calc Af Amer: 70 mL/min — ABNORMAL LOW (ref >90)
GFR calc non Af Amer: 60 mL/min — ABNORMAL LOW (ref >90)
Potassium: 3.7 mEq/L (ref 3.5–5.1)

## 2012-04-30 LAB — SURGICAL PCR SCREEN
MRSA, PCR: NEGATIVE
Staphylococcus aureus: NEGATIVE

## 2012-04-30 NOTE — Progress Notes (Signed)
04/30/12 0824  OBSTRUCTIVE SLEEP APNEA  Have you ever been diagnosed with sleep apnea through a sleep study? No  Do you snore loudly (loud enough to be heard through closed doors)?  1  Do you often feel tired, fatigued, or sleepy during the daytime? 0  Has anyone observed you stop breathing during your sleep? 0  Do you have, or are you being treated for high blood pressure? 1  BMI more than 35 kg/m2? 0  Age over 75 years old? 1  Neck circumference greater than 40 cm/18 inches? 0  Gender: 1  Obstructive Sleep Apnea Score 4  Score 4 or greater  Results sent to PCP

## 2012-04-30 NOTE — H&P (Signed)
ctive Problems 1. Distal Ureteral Stone On The Left 592.1 2. Microscopic Hematuria 599.72 3. Nephrolithiasis 592.0  History of Present Illness     Mr. Shell returns today for further evaluation of his microhematuria.   He saw [facility] in February and had 3-6 RBC's.   He has had no gross hematuria but has had a little blood in the semen about 2-3 weeks ago.  He has had some urgency and perineal discomfort.  He was not treated.  He has had no flank pain but has had a history of stones.  His PSA in December was 0.51.   A CT prior to this visit shows right renal stones and 2 obstructing 4mm left distal stones.   Past Medical History 1. History of  Diabetes Mellitus 250.00 2. History of  Hypercholesterolemia 272.0 3. History of  Hypertension 401.9  Surgical History 1. History of  Cataract Surgery 2. History of  Cystoscopy With Ureteroscopy 3. History of  Lithotripsy  Current Meds 1. Aloe Vera CAPS; Therapy: (Recorded:30Mar2012) to 2. Alpha Lipoic Acid CAPS; Therapy: (Recorded:30Mar2012) to 3. AmLODIPine Besylate 5 MG Oral Tablet; Therapy: (Recorded:30Mar2012) to 4. Aspirin 81 MG Oral Tablet; Therapy: (Recorded:30Mar2012) to 5. Berberis Homaccord LIQD; Therapy: (Recorded:04Apr2014) to 6. Chromium Picolinate TABS; Therapy: (Recorded:30Mar2012) to 7. CoQ-10 CAPS; Therapy: (Recorded:30Mar2012) to 8. Crestor TABS; Therapy: (Recorded:30Mar2012) to 9. Dulcolax TBEC; Therapy: (Recorded:30Mar2012) to 10. Flax Seed Oil CAPS; Therapy: (Recorded:30Mar2012) to 11. Gymnema Sylvestris Leaf Powder; Therapy: (Recorded:04Apr2014) to 12. Magnesium CAPS; Therapy: (Recorded:30Mar2012) to 13. Metamucil CAPS; Therapy: (Recorded:04Apr2014) to 14. Niaspan 1000 MG Oral Tablet Extended Release; Therapy: (Recorded:30Mar2012) to 15. Probiotic CAPS; Therapy: (Recorded:30Mar2012) to 16. Timolol Maleate 0.5 % Ophthalmic Gel Forming Solution; Therapy: (Recorded:04Apr2014) to 17. Vitamin B-12 2500 MCG Sublingual Tablet  Sublingual; Therapy: (Recorded:30Mar2012) to 18. Vitamin C TABS; Therapy: (Recorded:30Mar2012) to 19. Vitamin D-3 TABS; Therapy: (Recorded:30Mar2012) to 20. Vitamin E TABS; Therapy: (Recorded:30Mar2012) to 21. Vitamin K TABS; Therapy: (Recorded:30Mar2012) to 22. Vitamins/Minerals TABS; Therapy: (Recorded:30Mar2012) to  Allergies 1. Levaquin TABS 2. Benicar TABS 3. CeleBREX CAPS 4. Clarithromycin TABS 5. MetroNIDAZOLE TABS 6. Propoxyphene HCl CAPS  Family History 1. Family history of  Acute Myocardial Infarction V17.3 2. Family history of  Lung Cancer V16.1 3. Family history of  Nephrolithiasis 4. Family history of  Prostate Cancer V16.42  Social History 1. Marital History - Single 2. Never A Smoker 3. Retired From Work Denied  4. History of  Alcohol Use 5. History of  Caffeine Use 6. History of  Former Smoker 7. History of  Tobacco Use  Review of Systems Genitourinary, constitutional, skin, eye, otolaryngeal, hematologic/lymphatic, cardiovascular, pulmonary, endocrine, musculoskeletal, gastrointestinal, neurological and psychiatric system(s) were reviewed and pertinent findings if present are noted.  Genitourinary: feelings of urinary urgency and perineal pain, but no hematuria.  Gastrointestinal: no flank pain.  Musculoskeletal: joint pain.    Vitals Vital Signs [Data Includes: Last 1 Day]  04Apr2014 11:08AM  Blood Pressure: 154 / 75 Temperature: 97.8 F Heart Rate: 69  Physical Exam Constitutional: Well nourished and well developed . No acute distress.  Pulmonary: No respiratory distress and normal respiratory rhythm and effort.  Cardiovascular: Heart rate and rhythm are normal . No peripheral edema.  Abdomen: The abdomen is soft and nontender. No masses are palpated. No CVA tenderness. No hernias are palpable. No hepatosplenomegaly noted.    Results/Data Urine [Data Includes: Last 1 Day]   04Apr2014  COLOR YELLOW   APPEARANCE CLEAR   SPECIFIC GRAVITY 1.010    pH 6.0  GLUCOSE NEG mg/dL  BILIRUBIN NEG   KETONE NEG mg/dL  BLOOD NEG   PROTEIN NEG mg/dL  UROBILINOGEN 0.2 mg/dL  NITRITE NEG   LEUKOCYTE ESTERASE NEG    The following images/tracing/specimen were independently visualized:  I reviewed his CT from 4/2 and he has left hydro with some cortical atrophy and a small LLP stone. There are 2 left distal ureteral stones about 4 mm in size. He has right renal stones with the largest about 6 mm.    Assessment 1. Distal Ureteral Stone On The Left 592.1 2. Microscopic Hematuria 599.72 3. Nephrolithiasis 592.0   He has left ureteral stones with obstruction and right renal stones.   Plan Microscopic Hematuria (599.72)  1. UA With REFLEX  Done: 04Apr2014 11:17AM   I have discussed the options for treatment and will set him up for ureteroscopic stone extraction with possible stent and reviewed the risks of bleeding, infection, ureteral injury, need for secondary procedures, thrombotic events and anesthetic complications.   Discussion/Summary  CC: Dr. Dwana Melena.

## 2012-04-30 NOTE — Patient Instructions (Signed)
20 Stephen [name]  04/30/2012   Your procedure is scheduled on: 05/01/12  Report to [facility] at 1015 AM.  Call this number if you have problems the morning of surgery [phone]: [phone]   Remember:   Do not eat food or drink liquids After Midnight.     Take these medicines the morning of surgery with A SIP OF WATER: amlodipine    Do not wear jewelry, make-up or nail polish.  Do not wear lotions, powders, or perfumes. You may wear deodorant.  Do not shave 48 hours prior to surgery. Men may shave face and neck.  Do not bring valuables to the hospital.   Patients discharged the day of surgery will not be allowed to drive home.  Name and phone number of your driver: [name] or [name] [phone]   Please read over the following fact sheets that you were given: MRSA Information.  Birdie Sons, RN  pre op nurse call if needed [phone]    FAILURE TO FOLLOW THESE INSTRUCTIONS MAY RESULT IN CANCELLATION OF YOUR SURGERY   Patient Signature: ___________________________________________

## 2012-04-30 NOTE — Progress Notes (Signed)
EKG 08/22/11 on EPIC, CT abd and pelvis 03/14/12 on EPIC

## 2012-05-01 ENCOUNTER — Encounter (HOSPITAL_COMMUNITY): Payer: Self-pay | Admitting: Anesthesiology

## 2012-05-01 ENCOUNTER — Ambulatory Visit (HOSPITAL_COMMUNITY)
Admission: RE | Admit: 2012-05-01 | Discharge: 2012-05-01 | Disposition: A | Discharge status: Home / Self Care | Payer: Medicare Other | Source: Ambulatory Visit | Attending: Urology | Admitting: Urology

## 2012-05-01 ENCOUNTER — Ambulatory Visit (HOSPITAL_COMMUNITY): Payer: Medicare Other | Admitting: Anesthesiology

## 2012-05-01 ENCOUNTER — Encounter (HOSPITAL_COMMUNITY)
Admission: RE | Disposition: A | Discharge status: Home / Self Care | Payer: Self-pay | Source: Ambulatory Visit | Attending: Urology

## 2012-05-01 ENCOUNTER — Encounter (HOSPITAL_COMMUNITY): Payer: Self-pay | Admitting: *Deleted

## 2012-05-01 DIAGNOSIS — R3129 Other microscopic hematuria: Secondary | ICD-10-CM | POA: Insufficient documentation

## 2012-05-01 DIAGNOSIS — N1330 Unspecified hydronephrosis: Secondary | ICD-10-CM | POA: Insufficient documentation

## 2012-05-01 DIAGNOSIS — E7800 Pure hypercholesterolemia, unspecified: Secondary | ICD-10-CM | POA: Insufficient documentation

## 2012-05-01 DIAGNOSIS — N189 Chronic kidney disease, unspecified: Secondary | ICD-10-CM | POA: Diagnosis not present

## 2012-05-01 DIAGNOSIS — Z01812 Encounter for preprocedural laboratory examination: Secondary | ICD-10-CM | POA: Insufficient documentation

## 2012-05-01 DIAGNOSIS — E119 Type 2 diabetes mellitus without complications: Secondary | ICD-10-CM | POA: Insufficient documentation

## 2012-05-01 DIAGNOSIS — K219 Gastro-esophageal reflux disease without esophagitis: Secondary | ICD-10-CM | POA: Diagnosis not present

## 2012-05-01 DIAGNOSIS — I10 Essential (primary) hypertension: Secondary | ICD-10-CM | POA: Diagnosis not present

## 2012-05-01 DIAGNOSIS — N201 Calculus of ureter: Secondary | ICD-10-CM | POA: Diagnosis not present

## 2012-05-01 DIAGNOSIS — N200 Calculus of kidney: Secondary | ICD-10-CM | POA: Diagnosis not present

## 2012-05-01 DIAGNOSIS — Z79899 Other long term (current) drug therapy: Secondary | ICD-10-CM | POA: Insufficient documentation

## 2012-05-01 HISTORY — PX: CYSTOSCOPY WITH STENT PLACEMENT: SHX5790

## 2012-05-01 SURGERY — CYSTOSCOPY, WITH STENT INSERTION
Anesthesia: General | Laterality: Left | Wound class: Clean Contaminated

## 2012-05-01 MED ORDER — LACTATED RINGERS IV SOLN
INTRAVENOUS | Status: DC
  Administered 2012-05-01: 1000 mL via INTRAVENOUS

## 2012-05-01 MED ORDER — MEPERIDINE HCL 50 MG/ML IJ SOLN: 6.2500 mg | INTRAMUSCULAR | Status: DC | PRN

## 2012-05-01 MED ORDER — OXYCODONE HCL 5 MG PO TABS: 5.0000 mg | ORAL_TABLET | ORAL | Status: DC | PRN

## 2012-05-01 MED ORDER — FENTANYL CITRATE 0.05 MG/ML IJ SOLN
INTRAMUSCULAR | Status: DC | PRN
  Administered 2012-05-01: 100 ug via INTRAVENOUS

## 2012-05-01 MED ORDER — OXYCODONE-ACETAMINOPHEN 5-325 MG PO TABS: 1.0000 | ORAL_TABLET | ORAL | Status: DC | PRN

## 2012-05-01 MED ORDER — PHENAZOPYRIDINE HCL 200 MG PO TABS: 200.0000 mg | ORAL_TABLET | Freq: Three times a day (TID) | ORAL | Status: DC | PRN

## 2012-05-01 MED ORDER — ACETAMINOPHEN 325 MG PO TABS: 650.0000 mg | ORAL_TABLET | ORAL | Status: DC | PRN

## 2012-05-01 MED ORDER — ONDANSETRON HCL 4 MG/2ML IJ SOLN: 4.0000 mg | Freq: Four times a day (QID) | INTRAMUSCULAR | Status: DC | PRN

## 2012-05-01 MED ORDER — PROMETHAZINE HCL 25 MG/ML IJ SOLN: 6.2500 mg | INTRAMUSCULAR | Status: DC | PRN

## 2012-05-01 MED ORDER — SODIUM CHLORIDE 0.9 % IV SOLN: 250.0000 mL | INTRAVENOUS | Status: DC | PRN

## 2012-05-01 MED ORDER — HYOSCYAMINE SULFATE 0.125 MG SL SUBL: 0.1250 mg | SUBLINGUAL_TABLET | SUBLINGUAL | Status: DC | PRN

## 2012-05-01 MED ORDER — SODIUM CHLORIDE 0.9 % IJ SOLN: 3.0000 mL | INTRAMUSCULAR | Status: DC | PRN

## 2012-05-01 MED ORDER — 0.9 % SODIUM CHLORIDE (POUR BTL) OPTIME
TOPICAL | Status: DC | PRN
  Administered 2012-05-01: 1000 mL

## 2012-05-01 MED ORDER — SODIUM CHLORIDE 0.9 % IJ SOLN: 3.0000 mL | Freq: Two times a day (BID) | INTRAMUSCULAR | Status: DC

## 2012-05-01 MED ORDER — LIDOCAINE HCL (CARDIAC) 20 MG/ML IV SOLN
INTRAVENOUS | Status: DC | PRN
  Administered 2012-05-01: 50 mg via INTRAVENOUS

## 2012-05-01 MED ORDER — PROPOFOL 10 MG/ML IV BOLUS
INTRAVENOUS | Status: DC | PRN
  Administered 2012-05-01: 180 mg via INTRAVENOUS

## 2012-05-01 MED ORDER — ONDANSETRON HCL 4 MG/2ML IJ SOLN
INTRAMUSCULAR | Status: DC | PRN
  Administered 2012-05-01: 4 mg via INTRAVENOUS

## 2012-05-01 MED ORDER — ACETAMINOPHEN 10 MG/ML IV SOLN
INTRAVENOUS | Status: AC
  Filled 2012-05-01: qty 100

## 2012-05-01 MED ORDER — IOHEXOL 300 MG/ML  SOLN
INTRAMUSCULAR | Status: AC
  Filled 2012-05-01: qty 1

## 2012-05-01 MED ORDER — FENTANYL CITRATE 0.05 MG/ML IJ SOLN: 25.0000 ug | INTRAMUSCULAR | Status: DC | PRN

## 2012-05-01 MED ORDER — SODIUM CHLORIDE 0.9 % IR SOLN
Status: DC | PRN
  Administered 2012-05-01: 4000 mL

## 2012-05-01 MED ORDER — CIPROFLOXACIN IN D5W 400 MG/200ML IV SOLN
INTRAVENOUS | Status: AC
  Filled 2012-05-01: qty 200

## 2012-05-01 MED ORDER — CIPROFLOXACIN IN D5W 400 MG/200ML IV SOLN
400.0000 mg | INTRAVENOUS | Status: AC
  Administered 2012-05-01: 400 mg via INTRAVENOUS

## 2012-05-01 MED ORDER — INDIGOTINDISULFONATE SODIUM 8 MG/ML IJ SOLN
INTRAMUSCULAR | Status: AC
  Filled 2012-05-01: qty 5

## 2012-05-01 MED ORDER — ACETAMINOPHEN 650 MG RE SUPP: 650.0000 mg | RECTAL | Status: DC | PRN

## 2012-05-01 MED ORDER — IOHEXOL 300 MG/ML  SOLN
INTRAMUSCULAR | Status: DC | PRN
  Administered 2012-05-01: 10 mL

## 2012-05-01 SURGICAL SUPPLY — 13 items
BAG URO CATCHER STRL LF (DRAPE) ×2 IMPLANT
BASKET ZERO TIP NITINOL 2.4FR (BASKET) ×2 IMPLANT
CATH URET 5FR 28IN OPEN ENDED (CATHETERS) ×4 IMPLANT
CLOTH BEACON ORANGE TIMEOUT ST (SAFETY) ×2 IMPLANT
DRAPE CAMERA CLOSED 9X96 (DRAPES) ×2 IMPLANT
GLOVE SURG SS PI 8.0 STRL IVOR (GLOVE) ×2 IMPLANT
GOWN PREVENTION PLUS XLARGE (GOWN DISPOSABLE) ×2 IMPLANT
GOWN STRL REIN XL XLG (GOWN DISPOSABLE) ×2 IMPLANT
MANIFOLD NEPTUNE II (INSTRUMENTS) ×2 IMPLANT
PACK CYSTO (CUSTOM PROCEDURE TRAY) ×2 IMPLANT
SHEATH URET ACCESS 12FR/35CM (UROLOGICAL SUPPLIES) ×2 IMPLANT
STENT CONTOUR 6FRX26X.038 (STENTS) ×2 IMPLANT
TUBING CONNECTING 10 (TUBING) ×2 IMPLANT

## 2012-05-01 NOTE — Transfer of Care (Signed)
Immediate Anesthesia Transfer of Care Note  Patient: Stephen [name]  Procedure(s) Performed: Procedure(s) with comments: CYSTOSCOPY WITH URETEROSCOPY AND STONE EXTRACTION, LEFT STENT PLACEMENT  (Left) - STONE EXTRACTION POSSIBLE STENT PLACEMENT    Patient Location: PACU  Anesthesia Type:General  Level of Consciousness: awake, alert  and oriented  Airway & Oxygen Therapy: Patient Spontanous Breathing and Patient connected to face mask oxygen  Post-op Assessment: Report given to PACU RN and Post -op Vital signs reviewed and stable  Post vital signs: Reviewed and stable  Complications: No apparent anesthesia complications

## 2012-05-01 NOTE — Brief Op Note (Signed)
05/01/2012  2:11 PM  PATIENT:  Stephen [name]  77 y.o. male  PRE-OPERATIVE DIAGNOSIS:  Left Distal Stones  POST-OPERATIVE DIAGNOSIS:  Left Distal Stones  PROCEDURE:  Procedure(s) with comments: CYSTOSCOPY WITH URETEROSCOPY AND STONE EXTRACTION, LEFT STENT PLACEMENT  (Left) - STONE EXTRACTION Left retrograde pyelogram with interpretation.     SURGEON:  Surgeon(s) and Role:    * Anner Crete, MD - Primary  PHYSICIAN ASSISTANT:   ASSISTANTS: none   ANESTHESIA:   general  EBL:     BLOOD ADMINISTERED:none  DRAINS: 6x26 left JJ stent   LOCAL MEDICATIONS USED:  NONE  SPECIMEN:  Source of Specimen:  stone fragments   DISPOSITION OF SPECIMEN:  to patient  COUNTS:  YES  TOURNIQUET:  * No tourniquets in log *  DICTATION: .Other Dictation: Dictation Number [id number]  PLAN OF CARE: Discharge to home after PACU  PATIENT DISPOSITION:  PACU - hemodynamically stable.   Delay start of Pharmacological VTE agent (>24hrs) due to surgical blood loss or risk of bleeding: not applicable

## 2012-05-01 NOTE — Anesthesia Postprocedure Evaluation (Signed)
  Anesthesia Post-op Note  Patient: Stephen [name]  Procedure(s) Performed: Procedure(s) (LRB): CYSTOSCOPY WITH URETEROSCOPY AND STONE EXTRACTION, LEFT STENT PLACEMENT  (Left)  Patient Location: PACU  Anesthesia Type: General  Level of Consciousness: awake and alert   Airway and Oxygen Therapy: Patient Spontanous Breathing  Post-op Pain: mild  Post-op Assessment: Post-op Vital signs reviewed, Patient's Cardiovascular Status Stable, Respiratory Function Stable, Patent Airway and No signs of Nausea or vomiting  Last Vitals:  Filed Vitals:   05/01/12 1428  BP: 172/75  Pulse: 66  Temp:   Resp: 13    Post-op Vital Signs: stable   Complications: No apparent anesthesia complications

## 2012-05-01 NOTE — Interval H&P Note (Signed)
History and Physical Interval Note:  05/01/2012 12:42 PM  Stephen [name]  has presented today for surgery, with the diagnosis of Left Distal Stones  The various methods of treatment have been discussed with the patient and family. After consideration of risks, benefits and other options for treatment, the patient has consented to  Procedure(s) with comments: CYSTOSCOPY WITH URETEROSCOPY AND STONE EXTRACTION POSSIBLE STENT PLACEMENT  (Left) - STONE EXTRACTION POSSIBLE STENT PLACEMENT   as a surgical intervention .  The patient's history has been reviewed, patient examined, no change in status, stable for surgery.  I have reviewed the patient's chart and labs.  Questions were answered to the patient's satisfaction.     Stephen [name] J

## 2012-05-01 NOTE — Anesthesia Preprocedure Evaluation (Signed)
Anesthesia Evaluation  Patient identified by MRN, date of birth, ID band Patient awake    Reviewed: Allergy & Precautions, H&P , NPO status , Patient's Chart, lab work & pertinent test results  Airway Mallampati: II TM Distance: >3 FB Neck ROM: Full    Dental no notable dental hx.    Pulmonary neg pulmonary ROS,  breath sounds clear to auscultation  Pulmonary exam normal       Cardiovascular hypertension, Pt. on medications negative cardio ROS  Rhythm:Regular Rate:Normal     Neuro/Psych negative neurological ROS  negative psych ROS   GI/Hepatic negative GI ROS, Neg liver ROS,   Endo/Other  negative endocrine ROSdiabetes  Renal/GU negative Renal ROS  negative genitourinary   Musculoskeletal negative musculoskeletal ROS (+)   Abdominal   Peds negative pediatric ROS (+)  Hematology negative hematology ROS (+)   Anesthesia Other Findings   Reproductive/Obstetrics negative OB ROS                           Anesthesia Physical Anesthesia Plan  ASA: II  Anesthesia Plan: General   Post-op Pain Management:    Induction: Intravenous  Airway Management Planned: LMA  Additional Equipment:   Intra-op Plan:   Post-operative Plan: Extubation in OR  Informed Consent: I have reviewed the patients History and Physical, chart, labs and discussed the procedure including the risks, benefits and alternatives for the proposed anesthesia with the patient or authorized representative who has indicated his/her understanding and acceptance.   Dental advisory given  Plan Discussed with: CRNA  Anesthesia Plan Comments:         Anesthesia Quick Evaluation

## 2012-05-02 ENCOUNTER — Encounter (HOSPITAL_COMMUNITY): Payer: Self-pay | Admitting: Urology

## 2012-05-02 NOTE — Op Note (Signed)
NAME:  Stephen, [name] NO.:  [id number]  MEDICAL RECORD NO.:  [id number]  LOCATION:  [facility]                         FACILITY:  [facility]  PHYSICIAN:  Excell Seltzer. Annabell Howells, M.D.    DATE OF BIRTH:  04/15/1935  DATE OF PROCEDURE:  05/01/2012 DATE OF DISCHARGE:  05/01/2012                              OPERATIVE REPORT   PROCEDURES:  Cystoscopy, left retrograde pyelogram with interpretation, left ureteroscopic stone extraction, insertion of left double-J stent.  PREOPERATIVE DIAGNOSIS:  Left distal ureteral stones.  POSTOPERATIVE DIAGNOSIS:  Left mid ureteral stones.  SURGEON:  Excell Seltzer. Annabell Howells, M.D.  ANESTHESIA:  General.  SPECIMEN:  Stones.  DRAINS:  A 6-French 26-cm double-J stent.  BLOOD LOSS:  Minimal.  COMPLICATIONS:  None.  INDICATIONS:  Stephen [name] is a 77 year old white male with a history of prior lithotripsy, who was recently evaluated for hematuria and was found to have multiple stones in the left distal ureter on CT with some proximal hydronephrosis.  It was felt that ureteroscopy is indicated as these stones were not readily visualized on KUB.  FINDINGS OF PROCEDURE:  He was given Cipro and taken to the operating room where general anesthetic was induced.  He was placed in lithotomy position.  He was fitted with PAS hose.  His perineum and genitalia were prepped with Betadine solution and he was draped in usual sterile fashion.  Cystoscopy was performed using a 22-French scope and 12-degree lens.  Examination revealed a normal urethra.  The external sphincter was intact.  The prostatic urethra was approximately 2 cm in length with bilobar hyperplasia with minimal obstruction.  Examination of the bladder revealed mild trabeculation.  No tumors or stones were noted. Ureteral orifices were unremarkable and both refluxing clear urine.  The left ureteral orifice was cannulated with 5-French open-end catheter.  The catheter would not advanced beyond the lower  portion of the mid ureter and just above this some very faint opacities could be seen.  Contrast was instilled which confirmed a filling defect at this level consistent with a stone.  I then passed a guidewire by the stone.  There was initially some resistance but it eventually went to the kidney.  The open-end catheter was passed over the wire into the area of the proximal ureter.  The wire was removed.  Additional contrast was instilled to ensure intraluminal placement.  The catheter was in the limits so the wire was replaced through the kidney and the open-end catheter was removed.  The cystoscope was removed over the wire after draining the bladder and a 12-French introducer sheath inner core was used to dilate the ureter to the level of the stone.  It would not pass the stone.  A 6.4-French short ureteroscope was then passed alongside the wire up to the level of the stones which appeared at least the lead fragment to be submucosal.  The tip of the scope was used to tease the stones out from the mucosa and once this had been performed a 0 tip Nitinol basket was used to remove approximately 6 stone fragments ranging in size from 2 mm to 45 mm.  Once all the fragments had been removed, inspection of  the ureter revealed a mucosal tear in the area of the submucosal stone.  It was felt a stent was indicated.  The cystoscope was reinserted over the wire.  A 6-French 26-cm stent was advanced to the kidney under fluoroscopic guidance.  The wire was removed leaving good coil in the kidney, a good coil in the bladder. The bladder was then drained and the stones were evacuated from the bladder.  The cystoscope was removed.  The patient was taken down from lithotomy position.  His anesthetic was reversed.  He was moved to recovery room in stable condition.  There were no complications.     Excell Seltzer. Annabell Howells, M.D.     JJW/MEDQ  D:  05/01/2012  T:  05/02/2012  Job:  [id number]

## 2012-05-18 ENCOUNTER — Ambulatory Visit (INDEPENDENT_AMBULATORY_CARE_PROVIDER_SITE_OTHER): Payer: Medicare Other | Admitting: Urology

## 2012-05-18 DIAGNOSIS — N201 Calculus of ureter: Secondary | ICD-10-CM

## 2012-05-26 ENCOUNTER — Emergency Department (HOSPITAL_COMMUNITY)
Admission: EM | Admit: 2012-05-26 | Discharge: 2012-05-26 | Disposition: A | Discharge status: Home / Self Care | Payer: Medicare Other | Attending: Emergency Medicine | Admitting: Emergency Medicine

## 2012-05-26 ENCOUNTER — Encounter (HOSPITAL_COMMUNITY): Payer: Self-pay | Admitting: *Deleted

## 2012-05-26 DIAGNOSIS — R61 Generalized hyperhidrosis: Secondary | ICD-10-CM | POA: Diagnosis not present

## 2012-05-26 DIAGNOSIS — Z7982 Long term (current) use of aspirin: Secondary | ICD-10-CM | POA: Diagnosis not present

## 2012-05-26 DIAGNOSIS — Z8739 Personal history of other diseases of the musculoskeletal system and connective tissue: Secondary | ICD-10-CM | POA: Diagnosis not present

## 2012-05-26 DIAGNOSIS — Z8619 Personal history of other infectious and parasitic diseases: Secondary | ICD-10-CM | POA: Diagnosis not present

## 2012-05-26 DIAGNOSIS — K219 Gastro-esophageal reflux disease without esophagitis: Secondary | ICD-10-CM | POA: Insufficient documentation

## 2012-05-26 DIAGNOSIS — N400 Enlarged prostate without lower urinary tract symptoms: Secondary | ICD-10-CM | POA: Diagnosis not present

## 2012-05-26 DIAGNOSIS — Z8669 Personal history of other diseases of the nervous system and sense organs: Secondary | ICD-10-CM | POA: Insufficient documentation

## 2012-05-26 DIAGNOSIS — E119 Type 2 diabetes mellitus without complications: Secondary | ICD-10-CM | POA: Insufficient documentation

## 2012-05-26 DIAGNOSIS — Z79899 Other long term (current) drug therapy: Secondary | ICD-10-CM | POA: Insufficient documentation

## 2012-05-26 DIAGNOSIS — R5383 Other fatigue: Secondary | ICD-10-CM | POA: Insufficient documentation

## 2012-05-26 DIAGNOSIS — Z860100 Personal history of colon polyps, unspecified: Secondary | ICD-10-CM | POA: Insufficient documentation

## 2012-05-26 DIAGNOSIS — N390 Urinary tract infection, site not specified: Secondary | ICD-10-CM

## 2012-05-26 DIAGNOSIS — N189 Chronic kidney disease, unspecified: Secondary | ICD-10-CM | POA: Diagnosis not present

## 2012-05-26 DIAGNOSIS — R5381 Other malaise: Secondary | ICD-10-CM | POA: Diagnosis not present

## 2012-05-26 DIAGNOSIS — Z9889 Other specified postprocedural states: Secondary | ICD-10-CM | POA: Insufficient documentation

## 2012-05-26 DIAGNOSIS — Z87442 Personal history of urinary calculi: Secondary | ICD-10-CM | POA: Insufficient documentation

## 2012-05-26 DIAGNOSIS — Z8673 Personal history of transient ischemic attack (TIA), and cerebral infarction without residual deficits: Secondary | ICD-10-CM | POA: Insufficient documentation

## 2012-05-26 DIAGNOSIS — Z8679 Personal history of other diseases of the circulatory system: Secondary | ICD-10-CM | POA: Insufficient documentation

## 2012-05-26 DIAGNOSIS — E785 Hyperlipidemia, unspecified: Secondary | ICD-10-CM | POA: Diagnosis not present

## 2012-05-26 DIAGNOSIS — R509 Fever, unspecified: Secondary | ICD-10-CM | POA: Diagnosis not present

## 2012-05-26 DIAGNOSIS — I129 Hypertensive chronic kidney disease with stage 1 through stage 4 chronic kidney disease, or unspecified chronic kidney disease: Secondary | ICD-10-CM | POA: Insufficient documentation

## 2012-05-26 LAB — CBC WITH DIFFERENTIAL/PLATELET
Eosinophils Absolute: 0 10*3/uL (ref 0.0–0.7)
HCT: 43.5 % (ref 39.0–52.0)
Hemoglobin: 15.3 g/dL (ref 13.0–17.0)
Lymphs Abs: 1.8 10*3/uL (ref 0.7–4.0)
MCH: 32.6 pg (ref 26.0–34.0)
MCV: 92.6 fL (ref 78.0–100.0)
Monocytes Absolute: 1.6 10*3/uL — ABNORMAL HIGH (ref 0.1–1.0)
Monocytes Relative: 10 % (ref 3–12)
Neutrophils Relative %: 79 % — ABNORMAL HIGH (ref 43–77)
RBC: 4.7 MIL/uL (ref 4.22–5.81)

## 2012-05-26 LAB — COMPREHENSIVE METABOLIC PANEL
Alkaline Phosphatase: 116 U/L (ref 39–117)
BUN: 18 mg/dL (ref 6–23)
Creatinine, Ser: 1.16 mg/dL (ref 0.50–1.35)
GFR calc Af Amer: 69 mL/min — ABNORMAL LOW (ref >90)
Glucose, Bld: 139 mg/dL — ABNORMAL HIGH (ref 70–99)
Potassium: 4 mEq/L (ref 3.5–5.1)
Total Bilirubin: 0.9 mg/dL (ref 0.3–1.2)
Total Protein: 7.1 g/dL (ref 6.0–8.3)

## 2012-05-26 LAB — URINALYSIS, ROUTINE W REFLEX MICROSCOPIC
Bilirubin Urine: NEGATIVE
Glucose, UA: NEGATIVE mg/dL
Ketones, ur: 40 mg/dL — AB
Nitrite: NEGATIVE
Specific Gravity, Urine: 1.032 — ABNORMAL HIGH (ref 1.005–1.030)
pH: 5.5 (ref 5.0–8.0)

## 2012-05-26 LAB — URINE MICROSCOPIC-ADD ON

## 2012-05-26 MED ORDER — SULFAMETHOXAZOLE-TRIMETHOPRIM 800-160 MG PO TABS: 1.0000 | ORAL_TABLET | Freq: Two times a day (BID) | ORAL | Status: DC

## 2012-05-26 MED ORDER — DEXTROSE 5 % IV SOLN
1.0000 g | Freq: Once | INTRAVENOUS | Status: AC
  Administered 2012-05-26: 1 g via INTRAVENOUS
  Filled 2012-05-26: qty 10

## 2012-05-26 MED ORDER — SODIUM CHLORIDE 0.9 % IV BOLUS (SEPSIS)
1000.0000 mL | Freq: Once | INTRAVENOUS | Status: AC
  Administered 2012-05-26: 1000 mL via INTRAVENOUS

## 2012-05-26 NOTE — ED Provider Notes (Signed)
History     CSN: [id number]  Arrival date & time 05/26/12  1226   First MD Initiated Contact with Patient 05/26/12 1249      Chief Complaint  Patient presents with  . Dysuria  . Chills  . urinary stent removed 4/25     (Consider location/radiation/quality/duration/timing/severity/associated sxs/prior treatment) HPI Pt is a 77yo male c/o dysuria associated with chills and night sweats x4 days, 1wk post ureter stent removal by Dr. Wilson Singer.  Stent was placed due to pt having multiple stones.  During removal of stent, pt states urethra was cut causing some bleeding.  He was given 1 tablet of antibiotic after procedure.  Cannot find in pt's records.  Nothing makes pain better.  Denies abd pain, n/v/d, testicular pain or penile discharge.  Denies LBP. Denies chest pain or SOB.   Past Medical History  Diagnosis Date  . Hypertension   . Hyperlipidemia   . Chest pain   . TIA (transient ischemic attack)     "side affect from medication had symptoms of TIA"  . CKD (chronic kidney disease)   . BPH (benign prostatic hypertrophy)   . Kidney stones   . UTI (lower urinary tract infection)   . Arthritis   . Adenomatous colon polyp   . GERD (gastroesophageal reflux disease)   . Hemorrhoids   . Allergy   . Cataract     bil cataracts removed  . Diabetes mellitus without complication     controlled with diet  . Helicobacter pylori (H. pylori)     Past Surgical History  Procedure Laterality Date  . Cataract extraction Bilateral   . Lithotripsy    . Colonoscopy    . Esophagogastroduodenoscopy endoscopy    . Cystoscopy with stent placement Left 05/01/2012    Procedure: CYSTOSCOPY WITH URETEROSCOPY AND STONE EXTRACTION, LEFT STENT PLACEMENT ;  Surgeon: Anner Crete, MD;  Location: [facility];  Service: Urology;  Laterality: Left;  STONE EXTRACTION  STENT PLACEMENT      Family History  Problem Relation Age of Onset  . Heart attack Father   . Pancreatic cancer Brother   . Colon polyps Brother    . Lung cancer Brother   . Heart disease Brother   . Colon cancer Brother   . Esophageal cancer Neg Hx   . Stomach cancer Neg Hx     History  Substance Use Topics  . Smoking status: Never Smoker   . Smokeless tobacco: Never Used  . Alcohol Use: No      Review of Systems  Constitutional: Positive for fever, chills, diaphoresis and fatigue.  Respiratory: Negative for cough.   Cardiovascular: Negative for chest pain.  Genitourinary: Positive for dysuria. Negative for flank pain and penile pain.  All other systems reviewed and are negative.    Allergies  Levofloxacin; Celebrex; Metronidazole; Olmesartan medoxomil; and Clarithromycin  Home Medications   Current Outpatient Rx  Name  Route  Sig  Dispense  Refill  . amLODipine (NORVASC) 10 MG tablet   Oral   Take 5 mg by mouth daily.          . Ascorbic Acid (VITAMIN C) 1000 MG tablet   Oral   Take 1,000 mg by mouth 2 (two) times daily.         Marland Kitchen aspirin 81 MG tablet   Oral   Take 81 mg by mouth daily.          . Berberine Chloride POWD   Oral   Take  500 mg by mouth 2 (two) times daily with a meal.         . Cholecalciferol (VITAMIN D3) 2000 UNITS TABS   Oral   Take 1 capsule by mouth 2 (two) times daily.         . Chromium Picolinate (V-R CHROMIUM PICOLINATE) 200 MCG TABS   Oral   Take 1 tablet by mouth 2 (two) times daily.           . Coenzyme Q10 (CO Q 10) 100 MG CAPS   Oral   Take 1 capsule by mouth 2 (two) times daily.         Tery Sanfilippo Sodium (DULCOLAX STOOL SOFTENER PO)   Oral   Take 1 capsule by mouth 2 (two) times daily. Takes twice daily         . Evening Primrose Oil 1000 MG CAPS   Oral   Take 1 capsule by mouth daily.         . Folic Acid-Vit B6-Vit B12 (FOLBEE) 2.5-25-1 MG TABS   Oral   Take 1 tablet by mouth daily.         Forestine Na Leaf POWD   Oral   Take 400 mg by mouth 2 (two) times daily with a meal.          . hyoscyamine (LEVSIN/SL) 0.125 MG SL  tablet   Sublingual   Place 1 tablet (0.125 mg total) under the tongue every 4 (four) hours as needed for cramping.   30 tablet   0   . Magnesium 200 MG TABS   Oral   Take 1 tablet by mouth 2 (two) times daily.           . Multiple Vitamin (MULTIVITAMIN) tablet   Oral   Take 1 tablet by mouth daily.           . niacin (NIASPAN) 1000 MG CR tablet   Oral   Take 1,000 mg by mouth at bedtime.          . Probiotic Product (PROBIOTIC FORMULA) CAPS   Oral   Take 1 capsule by mouth daily.           . Psyllium (METAMUCIL) 48.57 % POWD   Oral   Take 3 g by mouth daily.          . rosuvastatin (CRESTOR) 5 MG tablet   Oral   Take 2.5 mg by mouth at bedtime.         . timolol (BETIMOL) 0.5 % ophthalmic solution   Both Eyes   Place 1 drop into both eyes daily.         . vitamin E (VITAMIN E) 400 UNIT capsule   Oral   Take 400 Units by mouth 2 (two) times daily.           . vitamin k 100 MCG tablet   Oral   Take 100 mcg by mouth daily.          Marland Kitchen oxyCODONE-acetaminophen (ROXICET) 5-325 MG per tablet   Oral   Take 1 tablet by mouth every 4 (four) hours as needed for pain.   30 tablet   0   . phenazopyridine (PYRIDIUM) 200 MG tablet   Oral   Take 1 tablet (200 mg total) by mouth 3 (three) times daily as needed for pain.   30 tablet   0   . sulfamethoxazole-trimethoprim (SEPTRA DS) 800-160 MG per tablet   Oral   Take  1 tablet by mouth every 12 (twelve) hours.   20 tablet   0     BP 143/78  Pulse 80  Temp(Src) 98.4 F (36.9 C) (Oral)  Resp 17  SpO2 99%  Physical Exam  Nursing note and vitals reviewed. Constitutional: He appears well-developed and well-nourished. No distress.  Pt resting in exam bed comfortably   HENT:  Head: Normocephalic and atraumatic.  Eyes: Conjunctivae are normal. No scleral icterus.  Neck: Normal range of motion.  Cardiovascular: Normal rate, regular rhythm and normal heart sounds.   Pulmonary/Chest: Effort normal  and breath sounds normal. No respiratory distress. He has no wheezes. He has no rales. He exhibits no tenderness.  Abdominal: Soft. Bowel sounds are normal. He exhibits no distension and no mass. There is no tenderness. There is no rebound and no guarding.  Abd soft NDNT  Genitourinary:  No CVA tenderness   Musculoskeletal: Normal range of motion.  Neurological: He is alert.  Skin: Skin is warm and dry. He is not diaphoretic.    ED Course  Procedures (including critical care time)  Labs Reviewed  URINALYSIS, ROUTINE W REFLEX MICROSCOPIC - Abnormal; Notable for the following:    Color, Urine AMBER (*)    APPearance CLOUDY (*)    Specific Gravity, Urine 1.032 (*)    Hgb urine dipstick TRACE (*)    Ketones, ur 40 (*)    Protein, ur 100 (*)    Leukocytes, UA MODERATE (*)    All other components within normal limits  CBC WITH DIFFERENTIAL - Abnormal; Notable for the following:    WBC 16.1 (*)    Neutrophils Relative 79 (*)    Neutro Abs 12.7 (*)    Lymphocytes Relative 11 (*)    Monocytes Absolute 1.6 (*)    All other components within normal limits  COMPREHENSIVE METABOLIC PANEL - Abnormal; Notable for the following:    Glucose, Bld 139 (*)    GFR calc non Af Amer 60 (*)    GFR calc Af Amer 69 (*)    All other components within normal limits  URINE MICROSCOPIC-ADD ON - Abnormal; Notable for the following:    Casts GRANULAR CAST (*)    All other components within normal limits  URINE CULTURE   No results found.   1. UTI (lower urinary tract infection)       MDM  Pt c/o 4 days of dysuria feeling unwell and chills 1wk post ureter stent removal that had been placed for stones.  Pt is afebrile, nl vitals and not nauseated or vomiting.  Stent was placed on 4/8 and removed on 4/25 by Dr. Wilson Singer.  Pt supposedly given 1 tablet of antibiotic but unsure which antibiotic. No documentation of previous antibiotic tx found.   CBC: 16.1   UA: wbc tntc, granular casts, moderate leuks.  Indicates UTI  Started pt on fluids and gave 1g Rocephin IV.  Pt is afebrile with nl vitals in ED.  Pt is not nauseated or vomiting. Not septic. Discussed pt with Dr. Rosalia Hammers who will consult urology to confirm pt may be discharged home with oral antibiotics.   Rx: Bactrim DS 1tab BID for 10days. Pt states "allergic rxn" was dx as thrush and unsure why it is still listed as allergy in system.  Advised pt to discontinue antibiotic and seek medical care if signs of allergic rxn.  Vitals: unremarkable. Discharged in stable condition.    Discussed pt with attending during ED encounter.  Junius Finner, PA-C 05/27/12 1015

## 2012-05-26 NOTE — ED Provider Notes (Addendum)
  I performed a history and physical examination of Stephen [name] and discussed his management with Ms. Gershon Mussel.  I agree with the history, physical, assessment, and plan of care, with the following exceptions: None  I was present for the following procedures: None Time Spent in Critical Care of the patient: None Time spent in discussions with the patient and family: 8 minutes  Salem Mastrogiovanni S  Patient with uti symptoms after recent intrumentation by urology.  Patient with feeling poorly for several days presents with uti and leukocytosis but no fever and taking po well.  Discussed with Dr. McDiarmid and plan op abx-septra.    Hilario Quarry, MD 05/26/12 1610  Hilario Quarry, MD 05/26/12 1535

## 2012-05-26 NOTE — ED Notes (Addendum)
Pt reports he had cytoscopy with stent placement 4/8, had stent removed 4/25. Pt reports not feeling well, chills, and night sweats x3-4 days. Dysuria 3/10. Took advil before arriving to ED. Called [facility] on call and recommended to come to ED today.  Hx of HTN, did not take blood pressure medication today.

## 2012-05-27 NOTE — ED Provider Notes (Signed)
  I performed a history and physical examination of Stephen [name] and discussed his management with  Ms. Gershon Mussel.  I agree with the history, physical, assessment, and plan of care, with the following exceptions: None  I was present for the following procedures: None Time Spent in Critical Care of the patient: None Time spent in discussions with the patient and family: 65  77 y.o. Male s/p recent instrumentation for ureteral stone now with uti.  Patient appears well without nausea, vomiting, fever, or chills and normal vitals signs.  Treated here with rocephin and discussed with Dr. McDiarmid.  He advises septra and they will see in follow.  Patient given return precautions.  Holli Humbles, MD 05/27/12 1444

## 2012-05-29 ENCOUNTER — Telehealth (HOSPITAL_COMMUNITY): Payer: Self-pay | Admitting: Emergency Medicine

## 2012-05-29 ENCOUNTER — Ambulatory Visit (INDEPENDENT_AMBULATORY_CARE_PROVIDER_SITE_OTHER): Payer: Medicare Other | Admitting: Urology

## 2012-05-29 DIAGNOSIS — R350 Frequency of micturition: Secondary | ICD-10-CM | POA: Diagnosis not present

## 2012-05-29 DIAGNOSIS — N201 Calculus of ureter: Secondary | ICD-10-CM | POA: Diagnosis not present

## 2012-05-29 LAB — URINE CULTURE

## 2012-05-29 NOTE — Progress Notes (Signed)
ED Antimicrobial Stewardship Positive Culture Follow Up   Stephen [name] is an 77 y.o. male who presented to [facility] on 05/26/2012 with a chief complaint of  Chief Complaint  Patient presents with  . Dysuria  . Chills  . urinary stent removed 4/25     Recent Results (from the past 720 hour(s))  SURGICAL PCR SCREEN     Status: None   Collection Time    04/30/12  9:00 AM      Result Value Range Status   MRSA, PCR NEGATIVE  NEGATIVE Final   Staphylococcus aureus NEGATIVE  NEGATIVE Final   Comment:            The Xpert SA Assay (FDA     approved for NASAL specimens     in patients over 67 years of age),     is one component of     a comprehensive surveillance     program.  Test performance has     been validated by [employer] for patients greater     than or equal to 26 year old.     It is not intended     to diagnose infection nor to     guide or monitor treatment.  URINE CULTURE     Status: None   Collection Time    05/26/12  1:00 PM      Result Value Range Status   Specimen Description URINE, CLEAN CATCH   Final   Special Requests NONE   Final   Culture  Setup Time 05/26/2012 20:04   Final   Colony Count 55,000 COLONIES/ML   Final   Culture ENTEROCOCCUS SPECIES   Final   Report Status 05/29/2012 FINAL   Final   Organism ID, Bacteria ENTEROCOCCUS SPECIES   Final  Organism sensitive to ampicillin, levofloxacin, nitrofurantoin, and vancomycin  [x]  Treated with Bactrim, organism resistant to prescribed antimicrobial []  Patient discharged originally without antimicrobial agent and treatment is now indicated  New antibiotic prescription: amoxicillin 250mg  po TID x 10 days #30 capsules.  ED Provider: Pascal Lux Wingen, PA-C   [name] 05/29/2012, 12:44 PM Infectious Diseases Pharmacist Phone# [phone]

## 2012-05-29 NOTE — ED Notes (Unsigned)
Post ED Visit - Positive Culture Follow-up: Successful Patient Follow-Up  Culture assessed and recommendations reviewed by: []  Wes Dulaney, Pharm.D., BCPS []  Celedonio Miyamoto, [address].D., BCPS []  Georgina Pillion, [address].D., BCPS []  [place], [address].D., BCPS, AAHIVP []  Estella Husk, Pharm.D., BCPS, AAHIVP  Positive *** culture  []  Patient discharged without antimicrobial prescription and treatment is now indicated [x]  Organism is resistant to prescribed ED discharge antimicrobial []  Patient with positive blood cultures  Changes discussed with ED provider: Pascal Lux Wingen PA-C New antibiotic prescription Amoxicillin 250 mg po TID x 10 days # 30    Contacted patient, date 05/29/2012 time 1828-Voice mail message left for patient to return call.  Larena Sox 05/29/2012, 6:27 PM

## 2012-05-30 ENCOUNTER — Telehealth (HOSPITAL_COMMUNITY): Payer: Self-pay | Admitting: Emergency Medicine

## 2012-05-30 NOTE — ED Notes (Addendum)
Pt returned call.  Pt informed of dx and need for addl tx.  Rx info called to [facility] [phone] and left on VM.  Cx results faxed to [facility] Attn Dr Shela Commons. Wrenn per pt request Fax [phone].

## 2012-06-05 ENCOUNTER — Other Ambulatory Visit (HOSPITAL_COMMUNITY): Payer: Self-pay | Admitting: Internal Medicine

## 2012-06-05 ENCOUNTER — Ambulatory Visit (HOSPITAL_COMMUNITY)
Admission: RE | Admit: 2012-06-05 | Discharge: 2012-06-05 | Disposition: A | Discharge status: Home / Self Care | Payer: Medicare Other | Source: Ambulatory Visit | Attending: Internal Medicine | Admitting: Internal Medicine

## 2012-06-05 DIAGNOSIS — R05 Cough: Secondary | ICD-10-CM | POA: Diagnosis not present

## 2012-06-05 DIAGNOSIS — R059 Cough, unspecified: Secondary | ICD-10-CM | POA: Diagnosis not present

## 2012-06-05 DIAGNOSIS — R509 Fever, unspecified: Secondary | ICD-10-CM | POA: Insufficient documentation

## 2012-06-05 DIAGNOSIS — N390 Urinary tract infection, site not specified: Secondary | ICD-10-CM | POA: Diagnosis not present

## 2012-06-05 DIAGNOSIS — J42 Unspecified chronic bronchitis: Secondary | ICD-10-CM | POA: Diagnosis not present

## 2012-06-06 DIAGNOSIS — Z87440 Personal history of urinary (tract) infections: Secondary | ICD-10-CM | POA: Diagnosis not present

## 2012-06-06 DIAGNOSIS — N201 Calculus of ureter: Secondary | ICD-10-CM | POA: Diagnosis not present

## 2012-06-06 DIAGNOSIS — R3129 Other microscopic hematuria: Secondary | ICD-10-CM | POA: Diagnosis not present

## 2012-06-20 DIAGNOSIS — R361 Hematospermia: Secondary | ICD-10-CM | POA: Diagnosis not present

## 2012-06-29 DIAGNOSIS — E785 Hyperlipidemia, unspecified: Secondary | ICD-10-CM | POA: Diagnosis not present

## 2012-06-29 DIAGNOSIS — E119 Type 2 diabetes mellitus without complications: Secondary | ICD-10-CM | POA: Diagnosis not present

## 2012-06-29 DIAGNOSIS — I10 Essential (primary) hypertension: Secondary | ICD-10-CM | POA: Diagnosis not present

## 2012-08-08 DIAGNOSIS — H4010X0 Unspecified open-angle glaucoma, stage unspecified: Secondary | ICD-10-CM | POA: Diagnosis not present

## 2012-10-23 DIAGNOSIS — R946 Abnormal results of thyroid function studies: Secondary | ICD-10-CM | POA: Diagnosis not present

## 2012-10-23 DIAGNOSIS — I10 Essential (primary) hypertension: Secondary | ICD-10-CM | POA: Diagnosis not present

## 2012-10-23 DIAGNOSIS — E119 Type 2 diabetes mellitus without complications: Secondary | ICD-10-CM | POA: Diagnosis not present

## 2012-10-23 DIAGNOSIS — E785 Hyperlipidemia, unspecified: Secondary | ICD-10-CM | POA: Diagnosis not present

## 2012-11-01 DIAGNOSIS — N390 Urinary tract infection, site not specified: Secondary | ICD-10-CM | POA: Diagnosis not present

## 2012-11-01 DIAGNOSIS — N200 Calculus of kidney: Secondary | ICD-10-CM | POA: Diagnosis not present

## 2012-11-01 DIAGNOSIS — R82998 Other abnormal findings in urine: Secondary | ICD-10-CM | POA: Diagnosis not present

## 2012-11-09 DIAGNOSIS — J069 Acute upper respiratory infection, unspecified: Secondary | ICD-10-CM | POA: Diagnosis not present

## 2012-11-26 ENCOUNTER — Emergency Department (HOSPITAL_COMMUNITY)
Admission: EM | Admit: 2012-11-26 | Discharge: 2012-11-26 | Disposition: A | Discharge status: Home / Self Care | Payer: Medicare Other | Attending: Emergency Medicine | Admitting: Emergency Medicine

## 2012-11-26 ENCOUNTER — Encounter (HOSPITAL_COMMUNITY): Payer: Self-pay | Admitting: Emergency Medicine

## 2012-11-26 DIAGNOSIS — N189 Chronic kidney disease, unspecified: Secondary | ICD-10-CM | POA: Diagnosis not present

## 2012-11-26 DIAGNOSIS — Z7982 Long term (current) use of aspirin: Secondary | ICD-10-CM | POA: Diagnosis not present

## 2012-11-26 DIAGNOSIS — Z79899 Other long term (current) drug therapy: Secondary | ICD-10-CM | POA: Diagnosis not present

## 2012-11-26 DIAGNOSIS — H538 Other visual disturbances: Secondary | ICD-10-CM | POA: Diagnosis not present

## 2012-11-26 DIAGNOSIS — E119 Type 2 diabetes mellitus without complications: Secondary | ICD-10-CM | POA: Diagnosis not present

## 2012-11-26 DIAGNOSIS — E785 Hyperlipidemia, unspecified: Secondary | ICD-10-CM | POA: Insufficient documentation

## 2012-11-26 DIAGNOSIS — I129 Hypertensive chronic kidney disease with stage 1 through stage 4 chronic kidney disease, or unspecified chronic kidney disease: Secondary | ICD-10-CM | POA: Insufficient documentation

## 2012-11-26 DIAGNOSIS — M129 Arthropathy, unspecified: Secondary | ICD-10-CM | POA: Diagnosis not present

## 2012-11-26 DIAGNOSIS — Z8719 Personal history of other diseases of the digestive system: Secondary | ICD-10-CM | POA: Insufficient documentation

## 2012-11-26 DIAGNOSIS — R51 Headache: Secondary | ICD-10-CM | POA: Diagnosis not present

## 2012-11-26 DIAGNOSIS — Z8673 Personal history of transient ischemic attack (TIA), and cerebral infarction without residual deficits: Secondary | ICD-10-CM | POA: Insufficient documentation

## 2012-11-26 DIAGNOSIS — Z860100 Personal history of colon polyps, unspecified: Secondary | ICD-10-CM | POA: Insufficient documentation

## 2012-11-26 DIAGNOSIS — Z87442 Personal history of urinary calculi: Secondary | ICD-10-CM | POA: Diagnosis not present

## 2012-11-26 DIAGNOSIS — I10 Essential (primary) hypertension: Secondary | ICD-10-CM | POA: Diagnosis not present

## 2012-11-26 DIAGNOSIS — Z8619 Personal history of other infectious and parasitic diseases: Secondary | ICD-10-CM | POA: Diagnosis not present

## 2012-11-26 DIAGNOSIS — Z87440 Personal history of urinary (tract) infections: Secondary | ICD-10-CM | POA: Insufficient documentation

## 2012-11-26 LAB — CBC WITH DIFFERENTIAL/PLATELET
Basophils Absolute: 0 10*3/uL (ref 0.0–0.1)
HCT: 44.7 % (ref 39.0–52.0)
Hemoglobin: 15.6 g/dL (ref 13.0–17.0)
Lymphocytes Relative: 31 % (ref 12–46)
Monocytes Absolute: 0.5 10*3/uL (ref 0.1–1.0)
Monocytes Relative: 7 % (ref 3–12)
Neutro Abs: 4.3 10*3/uL (ref 1.7–7.7)
Platelets: 179 10*3/uL (ref 150–400)
RDW: 12.6 % (ref 11.5–15.5)
WBC: 7.2 10*3/uL (ref 4.0–10.5)

## 2012-11-26 LAB — BASIC METABOLIC PANEL
CO2: 27 mEq/L (ref 19–32)
Chloride: 100 mEq/L (ref 96–112)
Creatinine, Ser: 1.16 mg/dL (ref 0.50–1.35)
GFR calc non Af Amer: 60 mL/min — ABNORMAL LOW (ref >90)
Sodium: 138 mEq/L (ref 135–145)

## 2012-11-26 LAB — TROPONIN I: Troponin I: <0.3 ng/mL (ref <0.30)

## 2012-11-26 MED ORDER — HYDROCODONE-ACETAMINOPHEN 5-325 MG PO TABS
1.0000 | ORAL_TABLET | Freq: Once | ORAL | Status: AC
  Administered 2012-11-26: 1 via ORAL
  Filled 2012-11-26: qty 1

## 2012-11-26 NOTE — ED Notes (Signed)
Complain of elevated blood pressure and vision blurred

## 2012-11-26 NOTE — ED Notes (Signed)
Pt assisted to bathroom. Request pain medication for headache. EDP aware

## 2012-11-26 NOTE — ED Notes (Signed)
No blurred vision at this time.  No headache.  Neuro assessment  Normal.

## 2012-11-26 NOTE — ED Provider Notes (Signed)
CSN: [id number]     Arrival date & time 11/26/12  1138 History  This chart was scribed for Gilda Crease, MD by Bennett Scrape, ED Scribe. This patient was seen in room APA19/APA19 and the patient's care was started at 1:15 PM.    Chief Complaint  Patient presents with  . Hypertension    The history is provided by the patient. No language interpreter was used.    HPI Comments: Stephen [name] is a 77 y.o. male with a h/o HTN who presents to the Emergency Department complaining of HTN that he noted upon waking today. He states that he awoke to having bilateral mild blurred vision prompting him to take his BP. He reports that it was 200s/70s. BP is 166/77 in then ED. He states that he had a small HA at the time and reports that the blurred vision has since resolved but denies any numbness, weakness or loss of vision. He takes 5 mg amlodipine daily for HTN. He reports that he has "white coat syndrome" at his PMD's but states that his BP last visi was 120/69.   Past Medical History  Diagnosis Date  . Hypertension   . Hyperlipidemia   . Chest pain   . TIA (transient ischemic attack)     "side affect from medication had symptoms of TIA"  . CKD (chronic kidney disease)   . BPH (benign prostatic hypertrophy)   . Kidney stones   . UTI (lower urinary tract infection)   . Arthritis   . Adenomatous colon polyp   . GERD (gastroesophageal reflux disease)   . Hemorrhoids   . Allergy   . Cataract     bil cataracts removed  . Diabetes mellitus without complication     controlled with diet  . Helicobacter pylori (H. pylori)    Past Surgical History  Procedure Laterality Date  . Cataract extraction Bilateral   . Lithotripsy    . Colonoscopy    . Esophagogastroduodenoscopy endoscopy    . Cystoscopy with stent placement Left 05/01/2012    Procedure: CYSTOSCOPY WITH URETEROSCOPY AND STONE EXTRACTION, LEFT STENT PLACEMENT ;  Surgeon: Anner Crete, MD;  Location: [facility];  Service:  Urology;  Laterality: Left;  STONE EXTRACTION  STENT PLACEMENT     Family History  Problem Relation Age of Onset  . Heart attack Father   . Pancreatic cancer Brother   . Colon polyps Brother   . Lung cancer Brother   . Heart disease Brother   . Colon cancer Brother   . Esophageal cancer Neg Hx   . Stomach cancer Neg Hx    History  Substance Use Topics  . Smoking status: Never Smoker   . Smokeless tobacco: Never Used  . Alcohol Use: No    Review of Systems  Eyes: Positive for visual disturbance.  Respiratory: Negative for shortness of breath.   Cardiovascular: Negative for chest pain.  Gastrointestinal: Negative for abdominal pain.  Neurological: Positive for headaches. Negative for weakness and numbness.  All other systems reviewed and are negative.    Allergies  Levofloxacin; Celebrex; Metronidazole; Olmesartan medoxomil; and Clarithromycin  Home Medications   Current Outpatient Rx  Name  Route  Sig  Dispense  Refill  . amLODipine (NORVASC) 10 MG tablet   Oral   Take 5 mg by mouth daily.          . Ascorbic Acid (VITAMIN C) 1000 MG tablet   Oral   Take 1,000 mg by mouth 2 (  two) times daily.         Marland Kitchen aspirin 81 MG tablet   Oral   Take 81 mg by mouth daily.          . Berberine Chloride POWD   Oral   Take 500 mg by mouth 2 (two) times daily with a meal.         . Cholecalciferol (VITAMIN D3) 2000 UNITS TABS   Oral   Take 1 capsule by mouth 2 (two) times daily.         . Chromium Picolinate (V-R CHROMIUM PICOLINATE) 200 MCG TABS   Oral   Take 1 tablet by mouth 2 (two) times daily.           . Coenzyme Q10 (CO Q 10) 100 MG CAPS   Oral   Take 1 capsule by mouth 2 (two) times daily.         Tery Sanfilippo Sodium (DULCOLAX STOOL SOFTENER PO)   Oral   Take 1 capsule by mouth 2 (two) times daily. Takes twice daily         . Evening Primrose Oil 1000 MG CAPS   Oral   Take 1 capsule by mouth daily.         . Folic Acid-Vit B6-Vit B12  (FOLBEE) 2.5-25-1 MG TABS   Oral   Take 1 tablet by mouth daily.         Forestine Na Leaf POWD   Oral   Take 400 mg by mouth 2 (two) times daily with a meal.          . hyoscyamine (LEVSIN/SL) 0.125 MG SL tablet   Sublingual   Place 1 tablet (0.125 mg total) under the tongue every 4 (four) hours as needed for cramping.   30 tablet   0   . Magnesium 200 MG TABS   Oral   Take 1 tablet by mouth 2 (two) times daily.           . Multiple Vitamin (MULTIVITAMIN) tablet   Oral   Take 1 tablet by mouth daily.           . niacin (NIASPAN) 1000 MG CR tablet   Oral   Take 1,000 mg by mouth at bedtime.          Marland Kitchen oxyCODONE-acetaminophen (ROXICET) 5-325 MG per tablet   Oral   Take 1 tablet by mouth every 4 (four) hours as needed for pain.   30 tablet   0   . phenazopyridine (PYRIDIUM) 200 MG tablet   Oral   Take 1 tablet (200 mg total) by mouth 3 (three) times daily as needed for pain.   30 tablet   0   . Probiotic Product (PROBIOTIC FORMULA) CAPS   Oral   Take 1 capsule by mouth daily.           . Psyllium (METAMUCIL) 48.57 % POWD   Oral   Take 3 g by mouth daily.          . rosuvastatin (CRESTOR) 5 MG tablet   Oral   Take 2.5 mg by mouth at bedtime.         . sulfamethoxazole-trimethoprim (SEPTRA DS) 800-160 MG per tablet   Oral   Take 1 tablet by mouth every 12 (twelve) hours.   20 tablet   0   . timolol (BETIMOL) 0.5 % ophthalmic solution   Both Eyes   Place 1 drop into both eyes daily.         Marland Kitchen  vitamin E (VITAMIN E) 400 UNIT capsule   Oral   Take 400 Units by mouth 2 (two) times daily.           . vitamin k 100 MCG tablet   Oral   Take 100 mcg by mouth daily.           Triage Vitals: BP 166/77  Pulse 70  Temp(Src) 97.5 F (36.4 C) (Oral)  Resp 20  Ht 5\' 8"  (1.727 m)  Wt 175 lb (79.379 kg)  BMI 26.61 kg/m2  SpO2 100%  Physical Exam  Nursing note and vitals reviewed. Constitutional: He is oriented to person, place,  and time. He appears well-developed and well-nourished. No distress.  HENT:  Head: Normocephalic and atraumatic.  Right Ear: Hearing normal.  Left Ear: Hearing normal.  Nose: Nose normal.  Mouth/Throat: Oropharynx is clear and moist and mucous membranes are normal.  Eyes: Conjunctivae and EOM are normal. Pupils are equal, round, and reactive to light.  Neck: Normal range of motion. Neck supple.  Cardiovascular: Regular rhythm, S1 normal and S2 normal.  Exam reveals no gallop and no friction rub.   No murmur heard. Pulmonary/Chest: Effort normal and breath sounds normal. No respiratory distress. He exhibits no tenderness.  Abdominal: Soft. Normal appearance and bowel sounds are normal. There is no hepatosplenomegaly. There is no tenderness. There is no rebound, no guarding, no tenderness at McBurney's point and negative Murphy's sign. No hernia.  Musculoskeletal: Normal range of motion.  Neurological: He is alert and oriented to person, place, and time. He has normal strength. No cranial nerve deficit or sensory deficit. Coordination normal. GCS eye subscore is 4. GCS verbal subscore is 5. GCS motor subscore is 6.  Skin: Skin is warm, dry and intact. No rash noted. No cyanosis.  Psychiatric: He has a normal mood and affect. His speech is normal and behavior is normal. Thought content normal.    ED Course  Procedures (including critical care time)  DIAGNOSTIC STUDIES: Oxygen Saturation is 100% on room air, normal by my interpretation.    COORDINATION OF CARE: 1:16 PM-Discussed treatment plan which includes CBC panel and BMP with pt at bedside and pt agreed to plan.   Labs Review Labs Reviewed  BASIC METABOLIC PANEL - Abnormal; Notable for the following:    Glucose, Bld 142 (*)    GFR calc non Af Amer 60 (*)    GFR calc Af Amer 69 (*)    All other components within normal limits  CBC WITH DIFFERENTIAL  TROPONIN I   Imaging Review No results found.  EKG Interpretation      Ventricular Rate:  71 PR Interval:  178 QRS Duration: 74 QT Interval:  378 QTC Calculation: 410 R Axis:   24 Text Interpretation:  Sinus rhythm with Premature atrial complexes in a pattern of bigeminy Otherwise normal ECG When compared with ECG of 20-Aug-2011 08:35, Premature atrial complexes are now Present            MDM   1. Hypertension    Presents to the ER for evaluation of headache, blurred vision and significantly elevated blood pressure. Blood pressure has slowly improved without any intervention. Patient has not even taken his normal blood pressure medication earlier today. His abnormal neurologic exam. Workup including cardiac workup is negative. Most recent blood pressure was 145/75. No further intervention is necessary. Patient will check his blood pressure each of the next few days and followup with primary doctor for a recheck. Return if  symptoms worsen.  I personally performed the services described in this documentation, which was scribed in my presence. The recorded information has been reviewed and is accurate.     Gilda Crease, MD 11/26/12 1524

## 2012-11-30 DIAGNOSIS — I10 Essential (primary) hypertension: Secondary | ICD-10-CM | POA: Diagnosis not present

## 2012-11-30 DIAGNOSIS — H538 Other visual disturbances: Secondary | ICD-10-CM | POA: Diagnosis not present

## 2012-12-02 ENCOUNTER — Emergency Department (HOSPITAL_COMMUNITY)
Admission: EM | Admit: 2012-12-02 | Discharge: 2012-12-02 | Disposition: A | Discharge status: Home / Self Care | Payer: Medicare Other | Attending: Emergency Medicine | Admitting: Emergency Medicine

## 2012-12-02 ENCOUNTER — Emergency Department (HOSPITAL_COMMUNITY): Payer: Medicare Other

## 2012-12-02 ENCOUNTER — Encounter (HOSPITAL_COMMUNITY): Payer: Self-pay | Admitting: Emergency Medicine

## 2012-12-02 DIAGNOSIS — Z8673 Personal history of transient ischemic attack (TIA), and cerebral infarction without residual deficits: Secondary | ICD-10-CM | POA: Diagnosis not present

## 2012-12-02 DIAGNOSIS — Z7982 Long term (current) use of aspirin: Secondary | ICD-10-CM | POA: Insufficient documentation

## 2012-12-02 DIAGNOSIS — Z87440 Personal history of urinary (tract) infections: Secondary | ICD-10-CM | POA: Insufficient documentation

## 2012-12-02 DIAGNOSIS — Z8719 Personal history of other diseases of the digestive system: Secondary | ICD-10-CM | POA: Diagnosis not present

## 2012-12-02 DIAGNOSIS — Z860100 Personal history of colon polyps, unspecified: Secondary | ICD-10-CM | POA: Insufficient documentation

## 2012-12-02 DIAGNOSIS — I10 Essential (primary) hypertension: Secondary | ICD-10-CM | POA: Insufficient documentation

## 2012-12-02 DIAGNOSIS — N189 Chronic kidney disease, unspecified: Secondary | ICD-10-CM | POA: Insufficient documentation

## 2012-12-02 DIAGNOSIS — E119 Type 2 diabetes mellitus without complications: Secondary | ICD-10-CM | POA: Insufficient documentation

## 2012-12-02 DIAGNOSIS — R0789 Other chest pain: Secondary | ICD-10-CM | POA: Insufficient documentation

## 2012-12-02 DIAGNOSIS — Z8669 Personal history of other diseases of the nervous system and sense organs: Secondary | ICD-10-CM | POA: Insufficient documentation

## 2012-12-02 DIAGNOSIS — Z79899 Other long term (current) drug therapy: Secondary | ICD-10-CM | POA: Diagnosis not present

## 2012-12-02 DIAGNOSIS — M129 Arthropathy, unspecified: Secondary | ICD-10-CM | POA: Diagnosis not present

## 2012-12-02 DIAGNOSIS — E785 Hyperlipidemia, unspecified: Secondary | ICD-10-CM | POA: Diagnosis not present

## 2012-12-02 DIAGNOSIS — I129 Hypertensive chronic kidney disease with stage 1 through stage 4 chronic kidney disease, or unspecified chronic kidney disease: Secondary | ICD-10-CM | POA: Diagnosis not present

## 2012-12-02 DIAGNOSIS — Z8619 Personal history of other infectious and parasitic diseases: Secondary | ICD-10-CM | POA: Diagnosis not present

## 2012-12-02 DIAGNOSIS — Z87442 Personal history of urinary calculi: Secondary | ICD-10-CM | POA: Diagnosis not present

## 2012-12-02 DIAGNOSIS — R079 Chest pain, unspecified: Secondary | ICD-10-CM

## 2012-12-02 LAB — CBC
HCT: 43.8 % (ref 39.0–52.0)
Hemoglobin: 15.8 g/dL (ref 13.0–17.0)
RBC: 4.79 MIL/uL (ref 4.22–5.81)
WBC: 7.8 10*3/uL (ref 4.0–10.5)

## 2012-12-02 LAB — BASIC METABOLIC PANEL
BUN: 17 mg/dL (ref 6–23)
CO2: 25 mEq/L (ref 19–32)
Chloride: 100 mEq/L (ref 96–112)
Glucose, Bld: 137 mg/dL — ABNORMAL HIGH (ref 70–99)
Potassium: 3.5 mEq/L (ref 3.5–5.1)

## 2012-12-02 LAB — GLUCOSE, CAPILLARY: Glucose-Capillary: 129 mg/dL — ABNORMAL HIGH (ref 70–99)

## 2012-12-02 LAB — POCT I-STAT TROPONIN I: Troponin i, poc: 0 ng/mL (ref 0.00–0.08)

## 2012-12-02 LAB — TROPONIN I: Troponin I: <0.3 ng/mL (ref <0.30)

## 2012-12-02 MED ORDER — ASPIRIN 81 MG PO CHEW
324.0000 mg | CHEWABLE_TABLET | Freq: Once | ORAL | Status: AC
  Administered 2012-12-02: 324 mg via ORAL
  Filled 2012-12-02: qty 4

## 2012-12-02 NOTE — ED Notes (Signed)
Pt. woke up this morning with mid chest and epigastric pain , nausea and elevated blood pressure ( 191/81) , denies SOB or diaphoresis .

## 2012-12-02 NOTE — ED Provider Notes (Signed)
CSN: [id number]     Arrival date & time 12/02/12  0600 History   First MD Initiated Contact with Patient 12/02/12 [phone]     Chief Complaint  Patient presents with  . Chest Pain   (Consider location/radiation/quality/duration/timing/severity/associated sxs/prior Treatment) HPI This patient is an elderly man with a history of diabetes, hypertension, hyperlipidemia.  He presents after experiencing a brief episode of diffuse chest tightness which she describes as 2/10 in maximum severity. This occurred while he was getting up to the bathroom this morning. It prompted to check his blood pressure. He knows that his blood pressure was 180/100. The patient decided that it was "definitely time to get checked out in the emergency department".  His symptoms of chest discomfort lasted less than 5 minutes and resolved without intervention. He remains symptom free. He reports compliance on medications.  The patient denies any history of coronary artery disease. He reports that he had a cardiac stress test 2 or 3 years ago at with our cardiology. He says his cardiologist told him "there isn't infintesimal chance of a heart attack in the near future".  Patient denies any associated shortness of breath, nausea, abdominal pain, diaphoresis or cough.  He did not take any medications prior to arrival.   Past Medical History  Diagnosis Date  . Hypertension   . Hyperlipidemia   . Chest pain   . TIA (transient ischemic attack)     "side affect from medication had symptoms of TIA"  . CKD (chronic kidney disease)   . BPH (benign prostatic hypertrophy)   . Kidney stones   . UTI (lower urinary tract infection)   . Arthritis   . Adenomatous colon polyp   . GERD (gastroesophageal reflux disease)   . Hemorrhoids   . Allergy   . Cataract     bil cataracts removed  . Diabetes mellitus without complication     controlled with diet  . Helicobacter pylori (H. pylori)    Past Surgical History  Procedure  Laterality Date  . Cataract extraction Bilateral   . Lithotripsy    . Colonoscopy    . Esophagogastroduodenoscopy endoscopy    . Cystoscopy with stent placement Left 05/01/2012    Procedure: CYSTOSCOPY WITH URETEROSCOPY AND STONE EXTRACTION, LEFT STENT PLACEMENT ;  Surgeon: Anner Crete, MD;  Location: [facility];  Service: Urology;  Laterality: Left;  STONE EXTRACTION  STENT PLACEMENT     Family History  Problem Relation Age of Onset  . Heart attack Father   . Pancreatic cancer Brother   . Colon polyps Brother   . Lung cancer Brother   . Heart disease Brother   . Colon cancer Brother   . Esophageal cancer Neg Hx   . Stomach cancer Neg Hx    History  Substance Use Topics  . Smoking status: Never Smoker   . Smokeless tobacco: Never Used  . Alcohol Use: No    Review of Systems Point review of systems performed is negative with the exception of symptoms noted above  Allergies  Levofloxacin; Celebrex; Metronidazole; Olmesartan medoxomil; and Clarithromycin  Home Medications   Current Outpatient Rx  Name  Route  Sig  Dispense  Refill  . amLODipine (NORVASC) 10 MG tablet   Oral   Take 5 mg by mouth daily.          . Ascorbic Acid (VITAMIN C) 1000 MG tablet   Oral   Take 1,000 mg by mouth 2 (two) times daily.         Marland Kitchen  aspirin EC 81 MG tablet   Oral   Take 81 mg by mouth daily.         . Berberine Chloride POWD   Oral   Take 500 mg by mouth 2 (two) times daily with a meal.         . Cholecalciferol (VITAMIN D3) 2000 UNITS TABS   Oral   Take 1 capsule by mouth 2 (two) times daily.         . Chromium Picolinate (V-R CHROMIUM PICOLINATE) 200 MCG TABS   Oral   Take 1 tablet by mouth 2 (two) times daily.           . Coenzyme Q10 (CO Q 10) 100 MG CAPS   Oral   Take 1 capsule by mouth 2 (two) times daily.         Tery Sanfilippo Sodium (DULCOLAX STOOL SOFTENER PO)   Oral   Take 1 capsule by mouth 2 (two) times daily. Takes twice daily         . Evening  Primrose Oil 1000 MG CAPS   Oral   Take 1 capsule by mouth daily.         . Folic Acid-Vit B6-Vit B12 (FOLBEE) 2.5-25-1 MG TABS   Oral   Take 1 tablet by mouth daily.         Forestine Na Leaf POWD   Oral   Take 400 mg by mouth 2 (two) times daily with a meal.          . Magnesium 200 MG TABS   Oral   Take 1 tablet by mouth 2 (two) times daily.           . Multiple Vitamin (MULTIVITAMIN) tablet   Oral   Take 1 tablet by mouth daily.           . niacin (NIASPAN) 1000 MG CR tablet   Oral   Take 1,000 mg by mouth at bedtime.          . Probiotic Product (PROBIOTIC FORMULA) CAPS   Oral   Take 1 capsule by mouth daily.           . Psyllium (METAMUCIL) 48.57 % POWD   Oral   Take 3 g by mouth daily.          . rosuvastatin (CRESTOR) 5 MG tablet   Oral   Take 2.5 mg by mouth at bedtime.         . timolol (BETIMOL) 0.5 % ophthalmic solution   Both Eyes   Place 1 drop into both eyes daily.         . vitamin E (VITAMIN E) 400 UNIT capsule   Oral   Take 400 Units by mouth 2 (two) times daily.           . vitamin k 100 MCG tablet   Oral   Take 100 mcg by mouth daily.           BP 182/72  Pulse 75  Temp(Src) 98.1 F (36.7 C) (Oral)  Resp 16  Ht 5\' 8"  (1.727 m)  Wt 184 lb 3.2 oz (83.553 kg)  BMI 28.01 kg/m2  SpO2 98% Physical Exam Gen: well developed and well nourished appearing Head: NCAT Eyes: PERL, EOMI Nose: no epistaixis or rhinorrhea Mouth/throat: mucosa is moist and pink Neck: supple, no stridor Lungs: CTA B, no wheezing, rhonchi or rales CV: Regular rate and rhythm, no murmur, extremities are well perfused Abd: soft, notender,  nondistended Back: no ttp, no cva ttp Skin: no rashese, wnl, warm and dry Ext: no edema, no ttp Neuro: CN ii-xii grossly intact, no focal deficits Psyche; normal affect,  mildly anxious appearing, calm and cooperative.   ED Course  Procedures (including critical care time) Labs  Review  Results for orders placed during the hospital encounter of 12/02/12 (from the past 24 hour(s))  POCT I-STAT TROPONIN I     Status: None   Collection Time    12/02/12  6:43 AM      Result Value Range   Troponin i, poc 0.00  0.00 - 0.08 ng/mL   Comment 3            ing Review No results found.  EKG Interpretation   None       MDM  Patient presents after a self limited and brief episode of atypical chest pain. EKG is wnl. First trop is wnl. CXR pending. Patient remains sx free. He seems to have quite a bit of anxiety surrounding his brief sx. We will tx with ASA. Plan is for observation, delta troponin and discharge home if the patient remains pain free and delta troponin is wnl. Patient will f/u with PCP and [facility]. Will sign out case to Dr. Blinda Leatherwood at change of shift.     Brandt Loosen, MD 12/02/12 [phone]

## 2012-12-07 ENCOUNTER — Ambulatory Visit (INDEPENDENT_AMBULATORY_CARE_PROVIDER_SITE_OTHER): Payer: Medicare Other | Admitting: Urology

## 2012-12-07 DIAGNOSIS — N200 Calculus of kidney: Secondary | ICD-10-CM | POA: Diagnosis not present

## 2012-12-07 DIAGNOSIS — Z87440 Personal history of urinary (tract) infections: Secondary | ICD-10-CM | POA: Diagnosis not present

## 2012-12-07 DIAGNOSIS — R350 Frequency of micturition: Secondary | ICD-10-CM | POA: Diagnosis not present

## 2012-12-10 ENCOUNTER — Encounter: Payer: Self-pay | Admitting: Cardiovascular Disease

## 2012-12-10 ENCOUNTER — Ambulatory Visit (INDEPENDENT_AMBULATORY_CARE_PROVIDER_SITE_OTHER): Payer: Medicare Other | Admitting: Cardiovascular Disease

## 2012-12-10 VITALS — BP 168/70 | HR 70 | Ht 68.0 in | Wt 182.0 lb

## 2012-12-10 DIAGNOSIS — R079 Chest pain, unspecified: Secondary | ICD-10-CM | POA: Diagnosis not present

## 2012-12-10 DIAGNOSIS — I10 Essential (primary) hypertension: Secondary | ICD-10-CM

## 2012-12-10 MED ORDER — AMLODIPINE BESYLATE 10 MG PO TABS: 10.0000 mg | ORAL_TABLET | Freq: Every day | ORAL | Status: DC

## 2012-12-10 NOTE — Progress Notes (Signed)
Patient ID: Stephen [name], male   DOB: May 02, 1935, 77 y.o.   MRN: [id number]       CARDIOLOGY CONSULT NOTE  Patient ID: Stephen [name] MRN: [id number] DOB/AGE: 01/31/1935 77 y.o.  Admit date: (Not on file) Primary Physician Catalina Pizza, MD  Reason for Consultation: chest pain  HPI: The patient is a 77 year old man with a past medical history significant for diabetes, hypertension, and hyperlipidemia, who recently presented to the emergency room after a brief episode of chest pain. It apparently occurred while he was getting up to go to the bathroom which prompted him to check his blood pressure. He was reportedly 180/100 mmHg. He then went to the emergency room. An ECG revealed normal sinus rhythm with premature atrial contractions and a mild diffuse nonspecific ST abnormality. Chest x-ray showed no evidence of acute cardiopulmonary disease. He reportedly had a stress test 2 or 3 years ago which was unremarkable. He was treated with aspirin 81 mg daily and discharged from the emergency room. 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  He tries to take care of himself and takes multiple vitamin supplements. He said that he wouldn't describe his prior symptoms as chest pain per se, but more of a discomfort in the upper abdominal lower chest region. He has not had any recurrences of these symptoms. He adheres to a low sodium-low fat diet. He checks his blood pressure regularly with systolic readings routinely in the 150-170 mmHg range, with a heart rate in the 60 beat per minute range. On one occasion when his blood pressure was as high as 191/81 mmHg on November 9, he took 10 mg of amlodipine.  SocHx: retired. Nonsmoker. Wife passed away in June 24, 2001. Active in his church. FamHx: brother who is 67 yrs old. Sister and mother died in their 49's.    Allergies  Allergen Reactions  . Levofloxacin Other (See Comments)    Severe headache  . Benicar Hct [Olmesartan  Medoxomil-Hctz] Other (See Comments)    reanal  . Celebrex [Celecoxib]     confusion  . Hydralazine   . Metronidazole Other (See Comments)    Headache  . Olmesartan Medoxomil Other (See Comments)    REACTION: progressive renal insufficiency  . Clarithromycin Rash    Bad Headache    Current Outpatient Prescriptions  Medication Sig Dispense Refill  . amLODipine (NORVASC) 5 MG tablet Take 5 mg by mouth daily.      . Ascorbic Acid (VITAMIN C) 1000 MG tablet Take 1,000 mg by mouth 2 (two) times daily.      Marland Kitchen aspirin EC 81 MG tablet Take 81 mg by mouth daily.      . Berberine Chloride POWD Take 500 mg by mouth 2 (two) times daily with a meal.      . Cholecalciferol (VITAMIN D3) 2000 UNITS TABS Take 1 capsule by mouth 2 (two) times daily.      . Chromium Picolinate (V-R CHROMIUM PICOLINATE) 200 MCG TABS Take 1 tablet by mouth 2 (two) times daily.        . Coenzyme Q10 (CO Q 10) 100 MG CAPS Take 1 capsule by mouth 2 (two) times daily.      Tery Sanfilippo Sodium (DULCOLAX STOOL SOFTENER PO) Take 1 capsule by mouth 2 (two) times daily. Takes twice daily      . Evening Primrose Oil 1000 MG CAPS Take 1 capsule by mouth daily.      . Folic Acid-Vit B6-Vit  B12 (FOLBEE) 2.5-25-1 MG TABS Take 1 tablet by mouth daily.      Lajuana Carry Sylvestris Leaf POWD Take 400 mg by mouth 2 (two) times daily with a meal.       . Magnesium 200 MG TABS Take 1 tablet by mouth 2 (two) times daily.        . Multiple Vitamin (MULTIVITAMIN) tablet Take 1 tablet by mouth daily.        . niacin (NIASPAN) 1000 MG CR tablet Take 1,000 mg by mouth at bedtime.       . Probiotic Product (PROBIOTIC FORMULA) CAPS Take 1 capsule by mouth daily.        . rosuvastatin (CRESTOR) 5 MG tablet Take 2.5 mg by mouth at bedtime.      . timolol (BETIMOL) 0.5 % ophthalmic solution Place 1 drop into both eyes daily.      . vitamin E (VITAMIN E) 400 UNIT capsule Take 400 Units by mouth 2 (two) times daily.        . vitamin k 100 MCG tablet Take 100  mcg by mouth daily.       . Wheat Dextrin (BENEFIBER PO) Take 10 mLs by mouth 2 (two) times daily.       No current facility-administered medications for this visit.    Past Medical History  Diagnosis Date  . Hypertension   . Hyperlipidemia   . Chest pain   . TIA (transient ischemic attack)     "side affect from medication had symptoms of TIA"  . CKD (chronic kidney disease)   . BPH (benign prostatic hypertrophy)   . Kidney stones   . UTI (lower urinary tract infection)   . Arthritis   . Adenomatous colon polyp   . GERD (gastroesophageal reflux disease)   . Hemorrhoids   . Allergy   . Cataract     bil cataracts removed  . Diabetes mellitus without complication     controlled with diet  . Helicobacter pylori (H. pylori)     Past Surgical History  Procedure Laterality Date  . Cataract extraction Bilateral   . Lithotripsy    . Colonoscopy    . Esophagogastroduodenoscopy endoscopy    . Cystoscopy with stent placement Left 05/01/2012    Procedure: CYSTOSCOPY WITH URETEROSCOPY AND STONE EXTRACTION, LEFT STENT PLACEMENT ;  Surgeon: Anner Crete, MD;  Location: [facility];  Service: Urology;  Laterality: Left;  STONE EXTRACTION  STENT PLACEMENT      History   Social History  . Marital Status: Widowed    Spouse Name: N/A    Number of Children: N/A  . Years of Education: N/A   Occupational History  . Retired from [employer] work    Social History Main Topics  . Smoking status: Never Smoker   . Smokeless tobacco: Never Used  . Alcohol Use: No  . Drug Use: No  . Sexual Activity: Not on file   Other Topics Concern  . Not on file   Social History Narrative   Lives in [place] by himself   Regular exercise     Family History  Problem Relation Age of Onset  . Heart attack Father   . Pancreatic cancer Brother   . Colon polyps Brother   . Lung cancer Brother   . Heart disease Brother   . Colon cancer Brother   . Esophageal cancer Neg Hx   . Stomach cancer Neg Hx        Prior to Admission  medications   Medication Sig Start Date End Date Taking? Authorizing Provider  amLODipine (NORVASC) 5 MG tablet Take 5 mg by mouth daily.   Yes Historical Provider, MD  Ascorbic Acid (VITAMIN C) 1000 MG tablet Take 1,000 mg by mouth 2 (two) times daily.   Yes Historical Provider, MD  aspirin EC 81 MG tablet Take 81 mg by mouth daily.   Yes Historical Provider, MD  Berberine Chloride POWD Take 500 mg by mouth 2 (two) times daily with a meal.   Yes Historical Provider, MD  Cholecalciferol (VITAMIN D3) 2000 UNITS TABS Take 1 capsule by mouth 2 (two) times daily.   Yes Historical Provider, MD  Chromium Picolinate (V-R CHROMIUM PICOLINATE) 200 MCG TABS Take 1 tablet by mouth 2 (two) times daily.     Yes Historical Provider, MD  Coenzyme Q10 (CO Q 10) 100 MG CAPS Take 1 capsule by mouth 2 (two) times daily.   Yes Historical Provider, MD  Docusate Sodium (DULCOLAX STOOL SOFTENER PO) Take 1 capsule by mouth 2 (two) times daily. Takes twice daily   Yes Historical Provider, MD  Evening Primrose Oil 1000 MG CAPS Take 1 capsule by mouth daily.   Yes Historical Provider, MD  Folic Acid-Vit B6-Vit B12 (FOLBEE) 2.5-25-1 MG TABS Take 1 tablet by mouth daily.   Yes Historical Provider, MD  Gymnema Sylvestris Leaf POWD Take 400 mg by mouth 2 (two) times daily with a meal.    Yes Historical Provider, MD  Magnesium 200 MG TABS Take 1 tablet by mouth 2 (two) times daily.     Yes Historical Provider, MD  Multiple Vitamin (MULTIVITAMIN) tablet Take 1 tablet by mouth daily.     Yes Historical Provider, MD  niacin (NIASPAN) 1000 MG CR tablet Take 1,000 mg by mouth at bedtime.    Yes Historical Provider, MD  Probiotic Product (PROBIOTIC FORMULA) CAPS Take 1 capsule by mouth daily.     Yes Historical Provider, MD  rosuvastatin (CRESTOR) 5 MG tablet Take 2.5 mg by mouth at bedtime.   Yes Historical Provider, MD  timolol (BETIMOL) 0.5 % ophthalmic solution Place 1 drop into both eyes daily.   Yes  Historical Provider, MD  vitamin E (VITAMIN E) 400 UNIT capsule Take 400 Units by mouth 2 (two) times daily.     Yes Historical Provider, MD  vitamin k 100 MCG tablet Take 100 mcg by mouth daily.    Yes Historical Provider, MD  Wheat Dextrin (BENEFIBER PO) Take 10 mLs by mouth 2 (two) times daily.   Yes Historical Provider, MD     Review of systems complete and found to be negative unless listed above in HPI     Physical exam Blood pressure 168/70, pulse 70, height 5\' 8"  (1.727 m), weight 182 lb (82.555 kg). General: NAD Neck: No JVD, no thyromegaly or thyroid nodule.  Lungs: Clear to auscultation bilaterally with normal respiratory effort. CV: Nondisplaced PMI.  Heart regular S1/S2, no S3/S4, no murmur.  No peripheral edema.  No carotid bruit.  Normal pedal pulses.  Abdomen: Soft, nontender, no hepatosplenomegaly, no distention.  Skin: Intact without lesions or rashes.  Neurologic: Alert and oriented x 3.  Psych: Normal affect. Extremities: No clubbing or cyanosis.  HEENT: Normal.   Labs:   Lab Results  Component Value Date   WBC 7.8 12/02/2012   HGB 15.8 12/02/2012   HCT 43.8 12/02/2012   MCV 91.4 12/02/2012   PLT 151 12/02/2012   No results found for this basename:  NA, K, CL, CO2, BUN, CREATININE, CALCIUM, LABALBU, PROT, BILITOT, ALKPHOS, ALT, AST, GLUCOSE,  in the last 168 hours Lab Results  Component Value Date   CKTOTAL 65 03/06/2008   CKMB 1.7 03/06/2008   TROPONINI <0.30 12/02/2012    Lab Results  Component Value Date   CHOL 142 07/14/2008   CHOL  Value: 189        ATP III CLASSIFICATION:  <200     mg/dL   Desirable  161-096  mg/dL   Borderline High  >=045    mg/dL   High        05/02/8117   Lab Results  Component Value Date   HDL 48 07/14/2008   HDL 30* 03/06/2008   Lab Results  Component Value Date   LDLCALC 67 07/14/2008   LDLCALC  Value: 141        Total Cholesterol/HDL:CHD Risk Coronary Heart Disease Risk Table                     Men   Women  1/2 Average Risk    3.4   3.3  Average Risk       5.0   4.4  2 X Average Risk   9.6   7.1  3 X Average Risk  23.4   11.0        Use the calculated Patient Ratio above and the CHD Risk Table to determine the patient's CHD Risk.        ATP III CLASSIFICATION (LDL):  <100     mg/dL   Optimal  147-829  mg/dL   Near or Above                    Optimal  130-159  mg/dL   Borderline  562-130  mg/dL   High  >865     mg/dL   Very High* 7/84/6962   Lab Results  Component Value Date   TRIG 134 07/14/2008   TRIG 91 03/06/2008   Lab Results  Component Value Date   CHOLHDL 6.3 03/06/2008   No results found for this basename: LDLDIRECT       EKG: See HPI  Studies: See HPI  ASSESSMENT AND PLAN:  1. Chest pain: no further recurrences, and may have been due to markedly elevated BP. He reportedly underwent a normal stress test approximately 3 yrs ago, although these results are unavailable to me at this time. For the time being, I would not recommend any further noninvasive testing. However, should his symptoms recur, I would recommend proceeding with an echocardiogram and a stress test. 2. Hypertension: uncontrolled on amlodipine 5 mg daily, which I will increase to 10 mg daily. I have asked the patient to check blood pressure readings 4-5 times per week, at different times throughout the day, in order to get a better approximation of mean BP values. These results will be provided to me at the end of that period so that I can determine if antihypertensive medication titration is indicated.  Dispo: f/u prn  Signed: Prentice Docker, M.D., F.A.C.C.  12/10/2012, 11:12 AM

## 2012-12-10 NOTE — Patient Instructions (Addendum)
Your physician recommends that you schedule a follow-up appointment as needed   Your physician has recommended you make the following change in your medication:   INCREASE Norvasc 10 mg daily

## 2012-12-16 ENCOUNTER — Emergency Department (HOSPITAL_COMMUNITY)
Admission: EM | Admit: 2012-12-16 | Discharge: 2012-12-16 | Disposition: A | Discharge status: Home / Self Care | Payer: Medicare Other | Attending: Emergency Medicine | Admitting: Emergency Medicine

## 2012-12-16 ENCOUNTER — Encounter (HOSPITAL_COMMUNITY): Payer: Self-pay | Admitting: Emergency Medicine

## 2012-12-16 ENCOUNTER — Emergency Department (HOSPITAL_COMMUNITY): Payer: Medicare Other

## 2012-12-16 DIAGNOSIS — Z87440 Personal history of urinary (tract) infections: Secondary | ICD-10-CM | POA: Diagnosis not present

## 2012-12-16 DIAGNOSIS — I129 Hypertensive chronic kidney disease with stage 1 through stage 4 chronic kidney disease, or unspecified chronic kidney disease: Secondary | ICD-10-CM | POA: Insufficient documentation

## 2012-12-16 DIAGNOSIS — E785 Hyperlipidemia, unspecified: Secondary | ICD-10-CM | POA: Diagnosis not present

## 2012-12-16 DIAGNOSIS — R51 Headache: Secondary | ICD-10-CM | POA: Insufficient documentation

## 2012-12-16 DIAGNOSIS — Z7982 Long term (current) use of aspirin: Secondary | ICD-10-CM | POA: Insufficient documentation

## 2012-12-16 DIAGNOSIS — K219 Gastro-esophageal reflux disease without esophagitis: Secondary | ICD-10-CM | POA: Diagnosis not present

## 2012-12-16 DIAGNOSIS — F411 Generalized anxiety disorder: Secondary | ICD-10-CM | POA: Diagnosis not present

## 2012-12-16 DIAGNOSIS — Z8619 Personal history of other infectious and parasitic diseases: Secondary | ICD-10-CM | POA: Insufficient documentation

## 2012-12-16 DIAGNOSIS — Z87442 Personal history of urinary calculi: Secondary | ICD-10-CM | POA: Diagnosis not present

## 2012-12-16 DIAGNOSIS — M129 Arthropathy, unspecified: Secondary | ICD-10-CM | POA: Diagnosis not present

## 2012-12-16 DIAGNOSIS — E119 Type 2 diabetes mellitus without complications: Secondary | ICD-10-CM | POA: Insufficient documentation

## 2012-12-16 DIAGNOSIS — Z8673 Personal history of transient ischemic attack (TIA), and cerebral infarction without residual deficits: Secondary | ICD-10-CM | POA: Diagnosis not present

## 2012-12-16 DIAGNOSIS — H538 Other visual disturbances: Secondary | ICD-10-CM | POA: Insufficient documentation

## 2012-12-16 DIAGNOSIS — Z860100 Personal history of colon polyps, unspecified: Secondary | ICD-10-CM | POA: Insufficient documentation

## 2012-12-16 DIAGNOSIS — I10 Essential (primary) hypertension: Secondary | ICD-10-CM | POA: Diagnosis not present

## 2012-12-16 DIAGNOSIS — Z79899 Other long term (current) drug therapy: Secondary | ICD-10-CM | POA: Insufficient documentation

## 2012-12-16 DIAGNOSIS — H539 Unspecified visual disturbance: Secondary | ICD-10-CM | POA: Diagnosis not present

## 2012-12-16 DIAGNOSIS — N189 Chronic kidney disease, unspecified: Secondary | ICD-10-CM | POA: Insufficient documentation

## 2012-12-16 LAB — COMPREHENSIVE METABOLIC PANEL
ALT: 20 U/L (ref 0–53)
AST: 18 U/L (ref 0–37)
Albumin: 4.1 g/dL (ref 3.5–5.2)
Alkaline Phosphatase: 96 U/L (ref 39–117)
CO2: 26 mEq/L (ref 19–32)
Chloride: 101 mEq/L (ref 96–112)
GFR calc non Af Amer: 71 mL/min — ABNORMAL LOW (ref >90)
Potassium: 3.7 mEq/L (ref 3.5–5.1)
Sodium: 137 mEq/L (ref 135–145)
Total Bilirubin: 0.4 mg/dL (ref 0.3–1.2)

## 2012-12-16 LAB — CBC WITH DIFFERENTIAL/PLATELET
Basophils Absolute: 0 10*3/uL (ref 0.0–0.1)
Basophils Relative: 0 % (ref 0–1)
HCT: 44.2 % (ref 39.0–52.0)
Hemoglobin: 15.8 g/dL (ref 13.0–17.0)
Lymphocytes Relative: 28 % (ref 12–46)
MCHC: 35.7 g/dL (ref 30.0–36.0)
Neutro Abs: 4.7 10*3/uL (ref 1.7–7.7)
Neutrophils Relative %: 60 % (ref 43–77)
Platelets: 187 10*3/uL (ref 150–400)
RDW: 12.9 % (ref 11.5–15.5)
WBC: 7.8 10*3/uL (ref 4.0–10.5)

## 2012-12-16 NOTE — ED Provider Notes (Signed)
CSN: [id number]     Arrival date & time 12/16/12  1219 History   First MD Initiated Contact with Patient 12/16/12 1228     Chief Complaint  Patient presents with  . Hypertension   (Consider location/radiation/quality/duration/timing/severity/associated sxs/prior Treatment) The history is provided by the patient.  Stephen [name] is a 77 y.o. male history of hypertension, hyperlipidemia, CK D., TIA here presenting with blurry vision and headaches. His amlodipine was increased a week ago to 10 mg daily. Since then he has intermittent blurry vision and headaches. Today he was at church and then at the end of the church service he stood up and felt lightheaded and had some blurry vision the left eye. Denies any weakness or numbness and the symptoms have resolved since then. Had a history TIA in the past.    Past Medical History  Diagnosis Date  . Hypertension   . Hyperlipidemia   . Chest pain   . TIA (transient ischemic attack)     "side affect from medication had symptoms of TIA"  . CKD (chronic kidney disease)   . BPH (benign prostatic hypertrophy)   . Kidney stones   . UTI (lower urinary tract infection)   . Arthritis   . Adenomatous colon polyp   . GERD (gastroesophageal reflux disease)   . Hemorrhoids   . Allergy   . Cataract     bil cataracts removed  . Diabetes mellitus without complication     controlled with diet  . Helicobacter pylori (H. pylori)    Past Surgical History  Procedure Laterality Date  . Cataract extraction Bilateral   . Lithotripsy    . Colonoscopy    . Esophagogastroduodenoscopy endoscopy    . Cystoscopy with stent placement Left 05/01/2012    Procedure: CYSTOSCOPY WITH URETEROSCOPY AND STONE EXTRACTION, LEFT STENT PLACEMENT ;  Surgeon: Anner Crete, MD;  Location: [facility];  Service: Urology;  Laterality: Left;  STONE EXTRACTION  STENT PLACEMENT     Family History  Problem Relation Age of Onset  . Heart attack Father   . Pancreatic cancer Brother    . Colon polyps Brother   . Lung cancer Brother   . Heart disease Brother   . Colon cancer Brother   . Esophageal cancer Neg Hx   . Stomach cancer Neg Hx    History  Substance Use Topics  . Smoking status: Never Smoker   . Smokeless tobacco: Never Used  . Alcohol Use: No    Review of Systems  HENT:       Blurry vision   Neurological: Positive for headaches.  All other systems reviewed and are negative.    Allergies  Levofloxacin; Benicar hct; Celebrex; Hydralazine; Metronidazole; Olmesartan medoxomil; and Clarithromycin  Home Medications   Current Outpatient Rx  Name  Route  Sig  Dispense  Refill  . amLODipine (NORVASC) 10 MG tablet   Oral   Take 1 tablet (10 mg total) by mouth daily.   90 tablet   4   . Ascorbic Acid (VITAMIN C) 1000 MG tablet   Oral   Take 1,000 mg by mouth 2 (two) times daily.         Marland Kitchen aspirin EC 81 MG tablet   Oral   Take 81 mg by mouth daily.         . Berberine Chloride POWD   Oral   Take 500 mg by mouth 2 (two) times daily with a meal.         .  Cholecalciferol (VITAMIN D3) 2000 UNITS TABS   Oral   Take 1 capsule by mouth 2 (two) times daily.         . Chromium Picolinate (V-R CHROMIUM PICOLINATE) 200 MCG TABS   Oral   Take 1 tablet by mouth 2 (two) times daily.           . Coenzyme Q10 (CO Q 10) 100 MG CAPS   Oral   Take 1 capsule by mouth 2 (two) times daily.         Tery Sanfilippo Sodium (DULCOLAX STOOL SOFTENER PO)   Oral   Take 1 capsule by mouth 2 (two) times daily. Takes twice daily         . Evening Primrose Oil 1000 MG CAPS   Oral   Take 1 capsule by mouth daily.         . Folic Acid-Vit B6-Vit B12 (FOLBEE) 2.5-25-1 MG TABS   Oral   Take 1 tablet by mouth daily.         Forestine Na Leaf POWD   Oral   Take 400 mg by mouth 2 (two) times daily with a meal.          . Magnesium 200 MG TABS   Oral   Take 1 tablet by mouth 2 (two) times daily.           . Multiple Vitamin  (MULTIVITAMIN) tablet   Oral   Take 1 tablet by mouth daily.           . niacin (NIASPAN) 1000 MG CR tablet   Oral   Take 1,000 mg by mouth at bedtime.          . Probiotic Product (PROBIOTIC FORMULA) CAPS   Oral   Take 1 capsule by mouth daily.           . rosuvastatin (CRESTOR) 5 MG tablet   Oral   Take 2.5 mg by mouth at bedtime.         . timolol (BETIMOL) 0.5 % ophthalmic solution   Both Eyes   Place 1 drop into both eyes daily.         . vitamin E (VITAMIN E) 400 UNIT capsule   Oral   Take 400 Units by mouth 2 (two) times daily.           . vitamin k 100 MCG tablet   Oral   Take 100 mcg by mouth daily.          . Wheat Dextrin (BENEFIBER PO)   Oral   Take 10 mLs by mouth 2 (two) times daily.          BP 185/77  Pulse 64  Temp(Src) 98.1 F (36.7 C) (Oral)  Resp 18  Wt 182 lb 12.8 oz (82.918 kg)  SpO2 98% Physical Exam  Nursing note and vitals reviewed. Constitutional: He is oriented to person, place, and time. He appears well-developed and well-nourished.  Anxious   HENT:  Head: Normocephalic.  Mouth/Throat: Oropharynx is clear and moist.  Eyes: Conjunctivae are normal. Pupils are equal, round, and reactive to light.  Neck: Normal range of motion. Neck supple.  Cardiovascular: Normal rate, regular rhythm and normal heart sounds.   Pulmonary/Chest: Effort normal and breath sounds normal. No respiratory distress. He has no wheezes. He has no rales.  Abdominal: Soft. Bowel sounds are normal. He exhibits no distension. There is no tenderness. There is no rebound and no guarding.  Musculoskeletal: Normal range  of motion. He exhibits no edema and no tenderness.  Neurological: He is alert and oriented to person, place, and time.  Nl strength and sensation throughout. Nl finger to nose. CN 2-12 intact. No pronator drift   Skin: Skin is warm and dry.  Psychiatric: He has a normal mood and affect. His behavior is normal. Judgment and thought content  normal.    ED Course  Procedures (including critical care time) Labs Review Labs Reviewed  COMPREHENSIVE METABOLIC PANEL - Abnormal; Notable for the following:    Glucose, Bld 110 (*)    GFR calc non Af Amer 71 (*)    GFR calc Af Amer 83 (*)    All other components within normal limits  CBC WITH DIFFERENTIAL   Imaging Review Ct Head Wo Contrast  12/16/2012   CLINICAL DATA:  Hypertension, blurred vision.  EXAM: CT HEAD WITHOUT CONTRAST  TECHNIQUE: Contiguous axial images were obtained from the base of the skull through the vertex without intravenous contrast.  COMPARISON:  08/20/2011  FINDINGS: No acute intracranial abnormality. Specifically, no hemorrhage, hydrocephalus, mass lesion, acute infarction, or significant intracranial injury. No acute calvarial abnormality. Visualized paranasal sinuses and mastoids clear. Orbital soft tissues unremarkable.  IMPRESSION: Normal study.   Electronically Signed   By: Charlett Nose M.D.   On: 12/16/2012 13:27    EKG Interpretation   None       MDM  No diagnosis found. Stephen [name] is a 77 y.o. male here with blurry vision, headaches. Likely related to increasing his BP meds. Low suspicion for stroke and CT head normal. Labs at baseline. BP dec to 152/70 on d/c. Stable for d/c and has follow up.      Richardean Canal, MD 12/16/12 [phone]

## 2012-12-16 NOTE — ED Notes (Signed)
The patient had no complaints; while performing orthostatics.

## 2012-12-16 NOTE — ED Notes (Addendum)
Per pt sts they increased his HTN medication 1 week ago and since has been having intermittent blurry vision, dizziness. sts some discomfort in his back. sts his blood pressure has been running high despite increase in medication.

## 2012-12-24 DIAGNOSIS — R5381 Other malaise: Secondary | ICD-10-CM | POA: Diagnosis not present

## 2012-12-24 DIAGNOSIS — I10 Essential (primary) hypertension: Secondary | ICD-10-CM | POA: Diagnosis not present

## 2012-12-24 DIAGNOSIS — G4710 Hypersomnia, unspecified: Secondary | ICD-10-CM | POA: Diagnosis not present

## 2012-12-24 DIAGNOSIS — J309 Allergic rhinitis, unspecified: Secondary | ICD-10-CM | POA: Diagnosis not present

## 2012-12-25 DIAGNOSIS — G4710 Hypersomnia, unspecified: Secondary | ICD-10-CM | POA: Diagnosis not present

## 2013-01-10 ENCOUNTER — Telehealth: Payer: Self-pay | Admitting: Cardiovascular Disease

## 2013-01-10 NOTE — Telephone Encounter (Signed)
PATIENT WOULD LIKE TO DISCUSS SIDE EFFECTS OF AN EYE DROP WITH HIS HEART MEDS / TGS

## 2013-01-11 NOTE — Telephone Encounter (Signed)
Pt is made aware. 

## 2013-01-11 NOTE — Telephone Encounter (Signed)
Pt has Timolol eye drops, which has many side effects. Please advise

## 2013-01-11 NOTE — Telephone Encounter (Signed)
Ok from my standpoint. Continue to check BP's.

## 2013-02-04 ENCOUNTER — Other Ambulatory Visit: Payer: Self-pay | Admitting: Dermatology

## 2013-02-04 DIAGNOSIS — L570 Actinic keratosis: Secondary | ICD-10-CM | POA: Diagnosis not present

## 2013-02-04 DIAGNOSIS — L821 Other seborrheic keratosis: Secondary | ICD-10-CM | POA: Diagnosis not present

## 2013-02-04 DIAGNOSIS — L851 Acquired keratosis [keratoderma] palmaris et plantaris: Secondary | ICD-10-CM | POA: Diagnosis not present

## 2013-02-04 DIAGNOSIS — D485 Neoplasm of uncertain behavior of skin: Secondary | ICD-10-CM | POA: Diagnosis not present

## 2013-02-04 DIAGNOSIS — L819 Disorder of pigmentation, unspecified: Secondary | ICD-10-CM | POA: Diagnosis not present

## 2013-02-04 DIAGNOSIS — D239 Other benign neoplasm of skin, unspecified: Secondary | ICD-10-CM | POA: Diagnosis not present

## 2013-02-13 DIAGNOSIS — H4011X0 Primary open-angle glaucoma, stage unspecified: Secondary | ICD-10-CM | POA: Diagnosis not present

## 2013-02-22 DIAGNOSIS — E119 Type 2 diabetes mellitus without complications: Secondary | ICD-10-CM | POA: Diagnosis not present

## 2013-02-22 DIAGNOSIS — I10 Essential (primary) hypertension: Secondary | ICD-10-CM | POA: Diagnosis not present

## 2013-02-22 DIAGNOSIS — E785 Hyperlipidemia, unspecified: Secondary | ICD-10-CM | POA: Diagnosis not present

## 2013-02-26 DIAGNOSIS — E785 Hyperlipidemia, unspecified: Secondary | ICD-10-CM | POA: Diagnosis not present

## 2013-02-26 DIAGNOSIS — I10 Essential (primary) hypertension: Secondary | ICD-10-CM | POA: Diagnosis not present

## 2013-02-26 DIAGNOSIS — E119 Type 2 diabetes mellitus without complications: Secondary | ICD-10-CM | POA: Diagnosis not present

## 2013-02-26 DIAGNOSIS — R946 Abnormal results of thyroid function studies: Secondary | ICD-10-CM | POA: Diagnosis not present

## 2013-03-19 DIAGNOSIS — H4011X0 Primary open-angle glaucoma, stage unspecified: Secondary | ICD-10-CM | POA: Diagnosis not present

## 2013-05-28 DIAGNOSIS — E119 Type 2 diabetes mellitus without complications: Secondary | ICD-10-CM | POA: Diagnosis not present

## 2013-05-28 DIAGNOSIS — I10 Essential (primary) hypertension: Secondary | ICD-10-CM | POA: Diagnosis not present

## 2013-05-28 DIAGNOSIS — E785 Hyperlipidemia, unspecified: Secondary | ICD-10-CM | POA: Diagnosis not present

## 2013-05-30 DIAGNOSIS — E119 Type 2 diabetes mellitus without complications: Secondary | ICD-10-CM | POA: Diagnosis not present

## 2013-05-30 DIAGNOSIS — E782 Mixed hyperlipidemia: Secondary | ICD-10-CM | POA: Diagnosis not present

## 2013-05-30 DIAGNOSIS — I10 Essential (primary) hypertension: Secondary | ICD-10-CM | POA: Diagnosis not present

## 2013-06-05 ENCOUNTER — Ambulatory Visit (HOSPITAL_COMMUNITY)
Admission: RE | Admit: 2013-06-05 | Discharge: 2013-06-05 | Disposition: A | Discharge status: Home / Self Care | Payer: Medicare Other | Source: Ambulatory Visit | Attending: Urology | Admitting: Urology

## 2013-06-05 ENCOUNTER — Other Ambulatory Visit: Payer: Self-pay | Admitting: Urology

## 2013-06-05 DIAGNOSIS — N200 Calculus of kidney: Secondary | ICD-10-CM

## 2013-06-07 ENCOUNTER — Ambulatory Visit (INDEPENDENT_AMBULATORY_CARE_PROVIDER_SITE_OTHER): Payer: Medicare Other | Admitting: Urology

## 2013-06-07 ENCOUNTER — Telehealth: Payer: Self-pay | Admitting: *Deleted

## 2013-06-07 DIAGNOSIS — N200 Calculus of kidney: Secondary | ICD-10-CM

## 2013-06-07 NOTE — Telephone Encounter (Signed)
Left message for pt to call back Monday morning.

## 2013-06-07 NOTE — Telephone Encounter (Signed)
PT HAS BEEN HAVING BP ISSUE AND PCP CHANGED SOME MEDICATIONS HE WOULD LIKE TO SPEAK TO A NURSE TODAY.

## 2013-06-11 DIAGNOSIS — I10 Essential (primary) hypertension: Secondary | ICD-10-CM | POA: Diagnosis not present

## 2013-07-01 ENCOUNTER — Emergency Department (HOSPITAL_COMMUNITY): Payer: Medicare Other

## 2013-07-01 ENCOUNTER — Encounter (HOSPITAL_COMMUNITY): Payer: Self-pay | Admitting: Emergency Medicine

## 2013-07-01 ENCOUNTER — Inpatient Hospital Stay (HOSPITAL_COMMUNITY)
Admission: EM | Admit: 2013-07-01 | Discharge: 2013-07-02 | DRG: 125 | Disposition: A | Discharge status: Home / Self Care | Payer: Medicare Other | Attending: Internal Medicine | Admitting: Internal Medicine

## 2013-07-01 DIAGNOSIS — Z79899 Other long term (current) drug therapy: Secondary | ICD-10-CM

## 2013-07-01 DIAGNOSIS — H539 Unspecified visual disturbance: Secondary | ICD-10-CM | POA: Diagnosis present

## 2013-07-01 DIAGNOSIS — G459 Transient cerebral ischemic attack, unspecified: Secondary | ICD-10-CM

## 2013-07-01 DIAGNOSIS — I129 Hypertensive chronic kidney disease with stage 1 through stage 4 chronic kidney disease, or unspecified chronic kidney disease: Secondary | ICD-10-CM | POA: Diagnosis present

## 2013-07-01 DIAGNOSIS — R51 Headache: Secondary | ICD-10-CM | POA: Diagnosis present

## 2013-07-01 DIAGNOSIS — Z7901 Long term (current) use of anticoagulants: Secondary | ICD-10-CM

## 2013-07-01 DIAGNOSIS — Z800 Family history of malignant neoplasm of digestive organs: Secondary | ICD-10-CM | POA: Diagnosis not present

## 2013-07-01 DIAGNOSIS — H3400 Transient retinal artery occlusion, unspecified eye: Secondary | ICD-10-CM | POA: Diagnosis present

## 2013-07-01 DIAGNOSIS — K219 Gastro-esophageal reflux disease without esophagitis: Secondary | ICD-10-CM | POA: Diagnosis present

## 2013-07-01 DIAGNOSIS — Z8249 Family history of ischemic heart disease and other diseases of the circulatory system: Secondary | ICD-10-CM

## 2013-07-01 DIAGNOSIS — I6529 Occlusion and stenosis of unspecified carotid artery: Secondary | ICD-10-CM | POA: Diagnosis not present

## 2013-07-01 DIAGNOSIS — H538 Other visual disturbances: Secondary | ICD-10-CM | POA: Diagnosis not present

## 2013-07-01 DIAGNOSIS — N400 Enlarged prostate without lower urinary tract symptoms: Secondary | ICD-10-CM | POA: Diagnosis present

## 2013-07-01 DIAGNOSIS — G453 Amaurosis fugax: Secondary | ICD-10-CM

## 2013-07-01 DIAGNOSIS — E119 Type 2 diabetes mellitus without complications: Secondary | ICD-10-CM | POA: Diagnosis present

## 2013-07-01 DIAGNOSIS — M129 Arthropathy, unspecified: Secondary | ICD-10-CM | POA: Diagnosis present

## 2013-07-01 DIAGNOSIS — E785 Hyperlipidemia, unspecified: Secondary | ICD-10-CM | POA: Diagnosis present

## 2013-07-01 DIAGNOSIS — I10 Essential (primary) hypertension: Secondary | ICD-10-CM | POA: Diagnosis not present

## 2013-07-01 DIAGNOSIS — E782 Mixed hyperlipidemia: Secondary | ICD-10-CM | POA: Diagnosis present

## 2013-07-01 DIAGNOSIS — Z8673 Personal history of transient ischemic attack (TIA), and cerebral infarction without residual deficits: Secondary | ICD-10-CM

## 2013-07-01 DIAGNOSIS — Z801 Family history of malignant neoplasm of trachea, bronchus and lung: Secondary | ICD-10-CM | POA: Diagnosis not present

## 2013-07-01 DIAGNOSIS — I658 Occlusion and stenosis of other precerebral arteries: Secondary | ICD-10-CM | POA: Diagnosis not present

## 2013-07-01 DIAGNOSIS — I517 Cardiomegaly: Secondary | ICD-10-CM | POA: Diagnosis not present

## 2013-07-01 DIAGNOSIS — N189 Chronic kidney disease, unspecified: Secondary | ICD-10-CM | POA: Diagnosis present

## 2013-07-01 LAB — COMPREHENSIVE METABOLIC PANEL
ALK PHOS: 102 U/L (ref 39–117)
ALT: 20 U/L (ref 0–53)
AST: 15 U/L (ref 0–37)
Albumin: 3.9 g/dL (ref 3.5–5.2)
BILIRUBIN TOTAL: 0.2 mg/dL — AB (ref 0.3–1.2)
BUN: 23 mg/dL (ref 6–23)
CHLORIDE: 98 meq/L (ref 96–112)
CO2: 29 mEq/L (ref 19–32)
Calcium: 9.1 mg/dL (ref 8.4–10.5)
Creatinine, Ser: 1.16 mg/dL (ref 0.50–1.35)
GFR calc Af Amer: 69 mL/min — ABNORMAL LOW (ref >90)
GFR calc non Af Amer: 59 mL/min — ABNORMAL LOW (ref >90)
Glucose, Bld: 119 mg/dL — ABNORMAL HIGH (ref 70–99)
POTASSIUM: 4.3 meq/L (ref 3.7–5.3)
Sodium: 141 mEq/L (ref 137–147)
Total Protein: 6.9 g/dL (ref 6.0–8.3)

## 2013-07-01 LAB — CBC WITH DIFFERENTIAL/PLATELET
Basophils Absolute: 0 10*3/uL (ref 0.0–0.1)
Basophils Relative: 0 % (ref 0–1)
Eosinophils Absolute: 0.1 10*3/uL (ref 0.0–0.7)
Eosinophils Relative: 2 % (ref 0–5)
HEMATOCRIT: 45.3 % (ref 39.0–52.0)
HEMOGLOBIN: 15.8 g/dL (ref 13.0–17.0)
Lymphocytes Relative: 38 % (ref 12–46)
Lymphs Abs: 3 10*3/uL (ref 0.7–4.0)
MCH: 31.9 pg (ref 26.0–34.0)
MCHC: 34.9 g/dL (ref 30.0–36.0)
MCV: 91.3 fL (ref 78.0–100.0)
MONO ABS: 0.7 10*3/uL (ref 0.1–1.0)
MONOS PCT: 8 % (ref 3–12)
NEUTROS ABS: 4.2 10*3/uL (ref 1.7–7.7)
Neutrophils Relative %: 52 % (ref 43–77)
Platelets: 174 10*3/uL (ref 150–400)
RBC: 4.96 MIL/uL (ref 4.22–5.81)
RDW: 13 % (ref 11.5–15.5)
WBC: 8.1 10*3/uL (ref 4.0–10.5)

## 2013-07-01 LAB — TROPONIN I

## 2013-07-01 MED ORDER — AMLODIPINE BESYLATE 5 MG PO TABS
5.0000 mg | ORAL_TABLET | Freq: Once | ORAL | Status: AC
Start: 2013-07-01 — End: 2013-07-02
  Administered 2013-07-02: 5 mg via ORAL
  Filled 2013-07-01: qty 1

## 2013-07-01 MED ORDER — ASPIRIN 81 MG PO CHEW
324.0000 mg | CHEWABLE_TABLET | Freq: Once | ORAL | Status: AC
  Administered 2013-07-02: 324 mg via ORAL
  Filled 2013-07-01: qty 4

## 2013-07-01 NOTE — ED Notes (Signed)
Pt was watching tv and noticed that could see a "spinning wheel" in lt eye.  This is gone now.  Alert, Has a headache, Has not felt well since Sunday.  No weakness or problem  Talking.  Sl nausea, pta.

## 2013-07-01 NOTE — ED Provider Notes (Signed)
CSN: [id number]     Arrival date & time 07/01/13  2223 History   First MD Initiated Contact with Patient 07/01/13 2317 This chart was scribed for Delora Fuel, MD by Anastasia Pall, ED Scribe. This patient was seen in room [facility] and the patient's care was started at 11:22 PM.  No chief complaint on file.  (Consider location/radiation/quality/duration/timing/severity/associated sxs/prior Treatment) The history is provided by the patient. No language interpreter was used.   HPI Comments: Stephen [name] is a 78 y.o. male who presents to the Emergency Department complaining of a visual disturbance, stating he Stephen a spinning wheel in his left eye that lasted about 10 minutes, onset earlier this evening around 10:15PM. He reports an associated constant, 4/10 pain severity, bilateral, occipital headache since the episode. He states his headache radiates down his neck. He reports feeling since yesterday ever since his visual disturbance. He states his BP ran up to 194/107. He states he has not taken his daily Amlodipine 5 mg, Aspirin, Crestor $RemoveBeforeDE'5mg'cIDlQgsnJaVIkbc$  ,and Niaspan yet today. He reports h/o similar headaches with taking an antibiotic and during an endoscopy where he was diagnosed with H. Pylori. He denies chest pain, chest tightness, numbness/tingling in UE and LE, trouble swallowing, speech difficulty, and any other associated symptoms. 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 He reports being told by an MD at a F/U appointment that he never had a TIA, despite it being listed in his PMH.    PCP - Delphina Cahill, MD  Past Medical History  Diagnosis Date  . Hypertension   . Hyperlipidemia   . Chest pain   . TIA (transient ischemic attack)     "side affect from medication had symptoms of TIA"  . BPH (benign prostatic hypertrophy)   . UTI (lower urinary tract infection)   . Arthritis   . Adenomatous colon polyp   . GERD (gastroesophageal reflux disease)   . Hemorrhoids   .  Allergy   . Cataract     bil cataracts removed  . Diabetes mellitus without complication     controlled with diet  . Helicobacter pylori (H. pylori)   . CKD (chronic kidney disease)   . Kidney stones    Past Surgical History  Procedure Laterality Date  . Cataract extraction Bilateral   . Lithotripsy    . Colonoscopy    . Esophagogastroduodenoscopy endoscopy    . Cystoscopy with stent placement Left 05/01/2012    Procedure: CYSTOSCOPY WITH URETEROSCOPY AND STONE EXTRACTION, LEFT STENT PLACEMENT ;  Surgeon: Malka So, MD;  Location: [facility];  Service: Urology;  Laterality: Left;  STONE EXTRACTION  STENT PLACEMENT     Family History  Problem Relation Age of Onset  . Heart attack Father   . Pancreatic cancer Brother   . Colon polyps Brother   . Lung cancer Brother   . Heart disease Brother   . Colon cancer Brother   . Esophageal cancer Neg Hx   . Stomach cancer Neg Hx    History  Substance Use Topics  . Smoking status: Never Smoker   . Smokeless tobacco: Never Used  . Alcohol Use: No    Review of Systems  All other systems reviewed and are negative.   Allergies  Levofloxacin; Benicar hct; Celebrex; Hydralazine; Metronidazole; Olmesartan medoxomil; and Clarithromycin  Home Medications   Prior to Admission medications   Medication Sig Start Date End Date Taking? Authorizing Provider  amLODipine (NORVASC) 5 MG tablet Take  5 mg by mouth at bedtime.   Yes Historical Provider, MD  Ascorbic Acid (VITAMIN C) 1000 MG tablet Take 1,000 mg by mouth 2 (two) times daily.   Yes Historical Provider, MD  aspirin EC 81 MG tablet Take 81 mg by mouth at bedtime.    Yes Historical Provider, MD  Berberine Chloride POWD Take 500 mg by mouth 2 (two) times daily with a meal.   Yes Historical Provider, MD  Cholecalciferol (VITAMIN D3) 2000 UNITS TABS Take 1 capsule by mouth 2 (two) times daily.   Yes Historical Provider, MD  Chromium Picolinate (V-R CHROMIUM PICOLINATE) 200 MCG TABS Take 1  tablet by mouth 2 (two) times daily.     Yes Historical Provider, MD  Coenzyme Q10 (CO Q 10) 100 MG CAPS Take 1 capsule by mouth 2 (two) times daily.   Yes Historical Provider, MD  Docusate Sodium (DULCOLAX STOOL SOFTENER PO) Take 1 capsule by mouth 2 (two) times daily. Takes twice daily   Yes Historical Provider, MD  Gymnema Sylvestris Leaf POWD Take 400 mg by mouth 2 (two) times daily with a meal. TABLET FORM   Yes Historical Provider, MD  Lactobacillus-Inulin ([place] PO) Take 1 capsule by mouth at bedtime.   Yes Historical Provider, MD  Magnesium 200 MG TABS Take 1 tablet by mouth 2 (two) times daily.     Yes Historical Provider, MD  metoprolol (LOPRESSOR) 50 MG tablet Take 50 mg by mouth 2 (two) times daily. 06/28/13  Yes Historical Provider, MD  metoprolol succinate (TOPROL-XL) 25 MG 24 hr tablet Take 25 mg by mouth every morning.   Yes Historical Provider, MD  Multiple Vitamin (MULTIVITAMIN WITH MINERALS) TABS tablet Take 1 tablet by mouth daily.   Yes Historical Provider, MD  niacin (NIASPAN) 1000 MG CR tablet Take 1,000 mg by mouth at bedtime.    Yes Historical Provider, MD  Probiotic Product (PROBIOTIC FORMULA) CAPS Take 1 capsule by mouth every morning.    Yes Historical Provider, MD  rosuvastatin (CRESTOR) 5 MG tablet Take 2.5 mg by mouth at bedtime.   Yes Historical Provider, MD  timolol (BETIMOL) 0.5 % ophthalmic solution Place 1 drop into both eyes daily.   Yes Historical Provider, MD  vitamin E (VITAMIN E) 400 UNIT capsule Take 400 Units by mouth 2 (two) times daily.     Yes Historical Provider, MD  vitamin k 100 MCG tablet Take 100 mcg by mouth every morning.    Yes Historical Provider, MD  Wheat Dextrin (BENEFIBER PO) Take 10 mLs by mouth 2 (two) times daily.   Yes Historical Provider, MD   BP 173/89  Pulse 66  Temp(Src) 97.9 F (36.6 C) (Oral)  Resp 20  Ht $R'5\' 8"'RF$  (1.727 m)  Wt 170 lb (77.111 kg)  BMI 25.85 kg/m2  SpO2 97% Physical Exam  Nursing note and  vitals reviewed. Constitutional: He is oriented to person, place, and time. He appears well-developed and well-nourished. No distress.  HENT:  Head: Normocephalic and atraumatic.  Eyes: Conjunctivae and EOM are normal. Pupils are equal, round, and reactive to light. No scleral icterus.  Fundi normal.   Neck: Normal range of motion. Neck supple. No JVD present. Carotid bruit is not present.  Cardiovascular: Normal rate, regular rhythm and normal heart sounds.   No murmur heard. Pulmonary/Chest: Effort normal and breath sounds normal. No respiratory distress. He has no wheezes. He has no rales.  Abdominal: Soft. Bowel sounds are normal. He exhibits no mass. There  is no tenderness.  Musculoskeletal: Normal range of motion. He exhibits no edema.  Lymphadenopathy:    He has no cervical adenopathy.  Neurological: He is alert and oriented to person, place, and time. He has normal reflexes. No cranial nerve deficit. Coordination normal.  Skin: Skin is warm and dry. No rash noted.  Psychiatric: He has a normal mood and affect. His behavior is normal.    ED Course  Procedures (including critical care time)  DIAGNOSTIC STUDIES: Oxygen Saturation is 97% on room air, normal by my interpretation.    COORDINATION OF CARE: 11:33 PM- Discussed EKG results with pt. Discussed treatment plan which includes CT head and admission overnight with pt at bedside and pt agreed to plan.   Medications  aspirin chewable tablet 324 mg (324 mg Oral Given 07/02/13 0009)  amLODipine (NORVASC) tablet 5 mg (5 mg Oral Given 07/02/13 0009)    Results for orders placed during the hospital encounter of 07/01/13  CBC WITH DIFFERENTIAL      Result Value Ref Range   WBC 8.1  4.0 - 10.5 K/uL   RBC 4.96  4.22 - 5.81 MIL/uL   Hemoglobin 15.8  13.0 - 17.0 g/dL   HCT 45.3  39.0 - 52.0 %   MCV 91.3  78.0 - 100.0 fL   MCH 31.9  26.0 - 34.0 pg   MCHC 34.9  30.0 - 36.0 g/dL   RDW 13.0  11.5 - 15.5 %   Platelets 174  150 - 400  K/uL   Neutrophils Relative % 52  43 - 77 %   Neutro Abs 4.2  1.7 - 7.7 K/uL   Lymphocytes Relative 38  12 - 46 %   Lymphs Abs 3.0  0.7 - 4.0 K/uL   Monocytes Relative 8  3 - 12 %   Monocytes Absolute 0.7  0.1 - 1.0 K/uL   Eosinophils Relative 2  0 - 5 %   Eosinophils Absolute 0.1  0.0 - 0.7 K/uL   Basophils Relative 0  0 - 1 %   Basophils Absolute 0.0  0.0 - 0.1 K/uL  COMPREHENSIVE METABOLIC PANEL      Result Value Ref Range   Sodium 141  137 - 147 mEq/L   Potassium 4.3  3.7 - 5.3 mEq/L   Chloride 98  96 - 112 mEq/L   CO2 29  19 - 32 mEq/L   Glucose, Bld 119 (*) 70 - 99 mg/dL   BUN 23  6 - 23 mg/dL   Creatinine, Ser 1.16  0.50 - 1.35 mg/dL   Calcium 9.1  8.4 - 10.5 mg/dL   Total Protein 6.9  6.0 - 8.3 g/dL   Albumin 3.9  3.5 - 5.2 g/dL   AST 15  0 - 37 U/L   ALT 20  0 - 53 U/L   Alkaline Phosphatase 102  39 - 117 U/L   Total Bilirubin 0.2 (*) 0.3 - 1.2 mg/dL   GFR calc non Af Amer 59 (*) >90 mL/min   GFR calc Af Amer 69 (*) >90 mL/min  TROPONIN I      Result Value Ref Range   Troponin I <0.30  <0.30 ng/mL  SEDIMENTATION RATE      Result Value Ref Range   Sed Rate 2  0 - 16 mm/hr    EKG Interpretation   Date/Time:  Monday July 01 2013 23:00:02 EDT Ventricular Rate:  66 PR Interval:  184 QRS Duration: 70 QT Interval:  389 QTC Calculation:  407 R Axis:   44 Text Interpretation:  Sinus rhythm Low voltage, precordial leads RSR' in  V1 or V2, right VCD or RVH When compared with ECG of 12/02/2012,  Nonspecific ST abnormality is no longer Present Confirmed by [facility]  MD,  Sadee Osland ([id number]) on 07/01/2013 11:05:28 PM      MDM   Final diagnoses:  Amaurosis fugax of left eye  Hypertension    Vision change in the left eye in a pattern that seems consistent with amaurosis fugax. Although the description is not classic for amaurosis, the changing nature of the visual change is consistent. I do suspect that this is a TIA equivalent. Old records are reviewed and he was evaluated  for TIA in 2010. Workup was done as an outpatient an MRI and MRA were results are not available in our computer system. Neither our carotid ultrasound reports. Echocardiogram was normal. Workup here is unremarkable including CT scan. He will be admitted under observation status for repeat TIA evaluation. Case is discussed with Dr., of [facility] who agrees to admit the patient. Because of high complaints and aching in his neck and shoulders, and an ESR was obtained to evaluate for possible polymyalgia rheumatica and temporal arteritis. This was normal at 2.  I personally performed the services described in this documentation, which was scribed in my presence. The recorded information has been reviewed and is accurate.     Delora Fuel, MD 11/23/57 4585

## 2013-07-02 ENCOUNTER — Encounter (HOSPITAL_COMMUNITY): Payer: Self-pay | Admitting: Radiology

## 2013-07-02 ENCOUNTER — Inpatient Hospital Stay (HOSPITAL_COMMUNITY): Payer: Medicare Other

## 2013-07-02 DIAGNOSIS — I10 Essential (primary) hypertension: Secondary | ICD-10-CM

## 2013-07-02 DIAGNOSIS — I658 Occlusion and stenosis of other precerebral arteries: Secondary | ICD-10-CM | POA: Diagnosis not present

## 2013-07-02 DIAGNOSIS — I517 Cardiomegaly: Secondary | ICD-10-CM | POA: Diagnosis not present

## 2013-07-02 DIAGNOSIS — H538 Other visual disturbances: Secondary | ICD-10-CM | POA: Diagnosis not present

## 2013-07-02 DIAGNOSIS — G459 Transient cerebral ischemic attack, unspecified: Secondary | ICD-10-CM | POA: Diagnosis not present

## 2013-07-02 DIAGNOSIS — I6529 Occlusion and stenosis of unspecified carotid artery: Secondary | ICD-10-CM | POA: Diagnosis not present

## 2013-07-02 DIAGNOSIS — G453 Amaurosis fugax: Secondary | ICD-10-CM | POA: Insufficient documentation

## 2013-07-02 DIAGNOSIS — H539 Unspecified visual disturbance: Secondary | ICD-10-CM | POA: Diagnosis present

## 2013-07-02 LAB — RAPID URINE DRUG SCREEN, HOSP PERFORMED
Amphetamines: NOT DETECTED
Barbiturates: NOT DETECTED
Benzodiazepines: NOT DETECTED
Cocaine: NOT DETECTED
Opiates: NOT DETECTED
Tetrahydrocannabinol: NOT DETECTED

## 2013-07-02 LAB — LIPID PANEL
CHOLESTEROL: 130 mg/dL (ref 0–200)
HDL: 55 mg/dL (ref >39)
LDL Cholesterol: 66 mg/dL (ref 0–99)
Total CHOL/HDL Ratio: 2.4 RATIO
Triglycerides: 44 mg/dL (ref <150)
VLDL: 9 mg/dL (ref 0–40)

## 2013-07-02 LAB — GLUCOSE, CAPILLARY: Glucose-Capillary: 120 mg/dL — ABNORMAL HIGH (ref 70–99)

## 2013-07-02 LAB — SEDIMENTATION RATE: Sed Rate: 2 mm/hr (ref 0–16)

## 2013-07-02 LAB — CBG MONITORING, ED: GLUCOSE-CAPILLARY: 110 mg/dL — AB (ref 70–99)

## 2013-07-02 LAB — HEMOGLOBIN A1C
Hgb A1c MFr Bld: 6.4 % — ABNORMAL HIGH (ref <5.7)
MEAN PLASMA GLUCOSE: 137 mg/dL — AB (ref <117)

## 2013-07-02 MED ORDER — ENOXAPARIN SODIUM 40 MG/0.4ML ~~LOC~~ SOLN
40.0000 mg | SUBCUTANEOUS | Status: DC
  Administered 2013-07-02: 40 mg via SUBCUTANEOUS
  Filled 2013-07-02: qty 0.4

## 2013-07-02 MED ORDER — ENOXAPARIN SODIUM 40 MG/0.4ML ~~LOC~~ SOLN: 40.0000 mg | Freq: Every day | SUBCUTANEOUS | Status: DC

## 2013-07-02 MED ORDER — NIACIN ER (ANTIHYPERLIPIDEMIC) 500 MG PO TBCR
1000.0000 mg | EXTENDED_RELEASE_TABLET | Freq: Every day | ORAL | Status: DC
Start: 2013-07-02 — End: 2013-07-02
  Filled 2013-07-02: qty 2

## 2013-07-02 MED ORDER — AMLODIPINE BESYLATE 5 MG PO TABS
10.0000 mg | ORAL_TABLET | Freq: Every day | ORAL | Status: DC
  Filled 2013-07-02: qty 2

## 2013-07-02 MED ORDER — METOPROLOL TARTRATE 25 MG PO TABS
12.5000 mg | ORAL_TABLET | Freq: Two times a day (BID) | ORAL | Status: DC
  Administered 2013-07-02: 12.5 mg via ORAL
  Filled 2013-07-02: qty 1

## 2013-07-02 MED ORDER — ASPIRIN 325 MG PO TABS
325.0000 mg | ORAL_TABLET | Freq: Every day | ORAL | Status: DC
  Administered 2013-07-02: 325 mg via ORAL
  Filled 2013-07-02: qty 1

## 2013-07-02 MED ORDER — ATORVASTATIN CALCIUM 10 MG PO TABS
10.0000 mg | ORAL_TABLET | Freq: Every day | ORAL | Status: DC
  Filled 2013-07-02: qty 1

## 2013-07-02 MED ORDER — ASPIRIN 325 MG PO TABS: 325.0000 mg | ORAL_TABLET | Freq: Every day | ORAL | Status: DC

## 2013-07-02 MED ORDER — TIMOLOL MALEATE 0.5 % OP SOLN
1.0000 [drp] | Freq: Every day | OPHTHALMIC | Status: DC
  Administered 2013-07-02: 1 [drp] via OPHTHALMIC
  Filled 2013-07-02 (×2): qty 5

## 2013-07-02 NOTE — ED Notes (Signed)
Patient is resting comfortably. 

## 2013-07-02 NOTE — Progress Notes (Signed)
Went over discharge instructions with patient. IV removed, patient transported via wheelchair in no acute distress.

## 2013-07-02 NOTE — H&P (Signed)
PCP:   Delphina Cahill, MD   Chief Complaint:  Vision disturbance  HPI:  78 year old male who  has a past medical history of Hypertension; Hyperlipidemia; Chest pain; TIA (transient ischemic attack); BPH (benign prostatic hypertrophy); UTI (lower urinary tract infection); Arthritis; Adenomatous colon polyp; GERD (gastroesophageal reflux disease); Hemorrhoids; Allergy; Cataract; Diabetes mellitus without complication; Helicobacter pylori (H. pylori); CKD (chronic kidney disease); and Kidney stones. Today came to the hospital after patient experienced left-sided visual disturbance while watching TV which lasted for about 5 minutes, patient says that he checked the blood pressure at that time and it was elevated to 194/107. This episode occurred around 10:15 PM. Patient also started having headache which is now improved. Headache was 4/10 in intensity bilateral, occipital area with radiation to the neck. Patient takes metoprolol and amlodipine forward pressure. He was given amlodipine when he came to the ED. He denies chest pain, no shortness of breath. Patient denies seizure activity, no numbness or tingling of upper or lower extremities. Good speech difficulty no trouble swallowing. Patient takes 81 mg aspirin every day. At this time patient is completely isn't dramatic, CT head was done which did not show any significant abnormality.  Allergies:   Allergies  Allergen Reactions  . Levofloxacin Other (See Comments)    Severe headache  . Benicar Hct [Olmesartan Medoxomil-Hctz] Other (See Comments)    reanal  . Celebrex [Celecoxib]     confusion  . Hydralazine   . Metronidazole Other (See Comments)    Headache  . Olmesartan Medoxomil Other (See Comments)    REACTION: progressive renal insufficiency  . Clarithromycin Rash    Bad Headache      Past Medical History  Diagnosis Date  . Hypertension   . Hyperlipidemia   . Chest pain   . TIA (transient ischemic attack)     "side affect from  medication had symptoms of TIA"  . BPH (benign prostatic hypertrophy)   . UTI (lower urinary tract infection)   . Arthritis   . Adenomatous colon polyp   . GERD (gastroesophageal reflux disease)   . Hemorrhoids   . Allergy   . Cataract     bil cataracts removed  . Diabetes mellitus without complication     controlled with diet  . Helicobacter pylori (H. pylori)   . CKD (chronic kidney disease)   . Kidney stones     Past Surgical History  Procedure Laterality Date  . Cataract extraction Bilateral   . Lithotripsy    . Colonoscopy    . Esophagogastroduodenoscopy endoscopy    . Cystoscopy with stent placement Left 05/01/2012    Procedure: CYSTOSCOPY WITH URETEROSCOPY AND STONE EXTRACTION, LEFT STENT PLACEMENT ;  Surgeon: Malka So, MD;  Location: [facility];  Service: Urology;  Laterality: Left;  STONE EXTRACTION  STENT PLACEMENT      Prior to Admission medications   Medication Sig Start Date End Date Taking? Authorizing Provider  amLODipine (NORVASC) 5 MG tablet Take 5 mg by mouth at bedtime.   Yes Historical Provider, MD  Ascorbic Acid (VITAMIN C) 1000 MG tablet Take 1,000 mg by mouth 2 (two) times daily.   Yes Historical Provider, MD  aspirin EC 81 MG tablet Take 81 mg by mouth at bedtime.    Yes Historical Provider, MD  Berberine Chloride POWD Take 500 mg by mouth 2 (two) times daily with a meal.   Yes Historical Provider, MD  Cholecalciferol (VITAMIN D3) 2000 UNITS TABS Take 1 capsule  by mouth 2 (two) times daily.   Yes Historical Provider, MD  Chromium Picolinate (V-R CHROMIUM PICOLINATE) 200 MCG TABS Take 1 tablet by mouth 2 (two) times daily.     Yes Historical Provider, MD  Coenzyme Q10 (CO Q 10) 100 MG CAPS Take 1 capsule by mouth 2 (two) times daily.   Yes Historical Provider, MD  Docusate Sodium (DULCOLAX STOOL SOFTENER PO) Take 1 capsule by mouth 2 (two) times daily. Takes twice daily   Yes Historical Provider, MD  Gymnema Sylvestris Leaf POWD Take 400 mg by mouth 2  (two) times daily with a meal. TABLET FORM   Yes Historical Provider, MD  Lactobacillus-Inulin ([place] PO) Take 1 capsule by mouth at bedtime.   Yes Historical Provider, MD  Magnesium 200 MG TABS Take 1 tablet by mouth 2 (two) times daily.     Yes Historical Provider, MD  metoprolol (LOPRESSOR) 50 MG tablet Take 50 mg by mouth 2 (two) times daily. 06/28/13  Yes Historical Provider, MD  metoprolol succinate (TOPROL-XL) 25 MG 24 hr tablet Take 25 mg by mouth every morning.   Yes Historical Provider, MD  Multiple Vitamin (MULTIVITAMIN WITH MINERALS) TABS tablet Take 1 tablet by mouth daily.   Yes Historical Provider, MD  niacin (NIASPAN) 1000 MG CR tablet Take 1,000 mg by mouth at bedtime.    Yes Historical Provider, MD  Probiotic Product (PROBIOTIC FORMULA) CAPS Take 1 capsule by mouth every morning.    Yes Historical Provider, MD  rosuvastatin (CRESTOR) 5 MG tablet Take 2.5 mg by mouth at bedtime.   Yes Historical Provider, MD  timolol (BETIMOL) 0.5 % ophthalmic solution Place 1 drop into both eyes daily.   Yes Historical Provider, MD  vitamin E (VITAMIN E) 400 UNIT capsule Take 400 Units by mouth 2 (two) times daily.     Yes Historical Provider, MD  vitamin k 100 MCG tablet Take 100 mcg by mouth every morning.    Yes Historical Provider, MD  Wheat Dextrin (BENEFIBER PO) Take 10 mLs by mouth 2 (two) times daily.   Yes Historical Provider, MD    Social History:  reports that he has never smoked. He has never used smokeless tobacco. He reports that he does not drink alcohol or use illicit drugs.  Family History  Problem Relation Age of Onset  . Heart attack Father   . Pancreatic cancer Brother   . Colon polyps Brother   . Lung cancer Brother   . Heart disease Brother   . Colon cancer Brother   . Esophageal cancer Neg Hx   . Stomach cancer Neg Hx      All the positives are listed in BOLD  Review of Systems:  HEENT: Headache, blurred vision, runny nose, sore  throat Neck: Hypothyroidism, hyperthyroidism,,lymphadenopathy Chest : Shortness of breath, history of COPD, Asthma Heart : Chest pain, history of coronary arterey disease GI:  Nausea, vomiting, diarrhea, constipation, GERD GU: Dysuria, urgency, frequency of urination, hematuria Neuro: Stroke, seizures, syncope Psych: Depression, anxiety, hallucinations   Physical Exam: Blood pressure 151/69, pulse 63, temperature 97.9 F (36.6 C), temperature source Oral, resp. rate 11, height 5\' 8"  (1.727 m), weight 77.111 kg (170 lb), SpO2 99.00%. Constitutional:   Patient is a well-developed and well-nourished male* in no acute distress and cooperative with exam. Head: Normocephalic and atraumatic Mouth: Mucus membranes moist Eyes: PERRL, EOMI, conjunctivae normal Neck: Supple, No Thyromegaly Cardiovascular: RRR, S1 normal, S2 normal Pulmonary/Chest: CTAB, no wheezes, rales, or rhonchi Abdominal:  Soft. Non-tender, non-distended, bowel sounds are normal, no masses, organomegaly, or guarding present.  Neurological: A&O x3, Strenght is normal and symmetric bilaterally, cranial nerve II-XII are grossly intact, no focal motor deficit, sensory intact to light touch bilaterally.  Extremities : No Cyanosis, Clubbing or Edema  Labs on Admission:  Basic Metabolic Panel:  Recent Labs Lab 07/01/13 2252  NA 141  K 4.3  CL 98  CO2 29  GLUCOSE 119*  BUN 23  CREATININE 1.16  CALCIUM 9.1   Liver Function Tests:  Recent Labs Lab 07/01/13 2252  AST 15  ALT 20  ALKPHOS 102  BILITOT 0.2*  PROT 6.9  ALBUMIN 3.9   No results found for this basename: LIPASE, AMYLASE,  in the last 168 hours No results found for this basename: AMMONIA,  in the last 168 hours CBC:  Recent Labs Lab 07/01/13 2252  WBC 8.1  NEUTROABS 4.2  HGB 15.8  HCT 45.3  MCV 91.3  PLT 174   Cardiac Enzymes:  Recent Labs Lab 07/01/13 2252  TROPONINI <0.30    BNP (last 3 results)  Recent Labs  12/02/12 0630   PROBNP 15.5   CBG: No results found for this basename: GLUCAP,  in the last 168 hours  Radiological Exams on Admission: Ct Head Wo Contrast  07/02/2013   CLINICAL DATA:  Elevated blood pressure with blurred vision.  EXAM: CT HEAD WITHOUT CONTRAST  TECHNIQUE: Contiguous axial images were obtained from the base of the skull through the vertex without contrast.  COMPARISON:  12/16/2012.  FINDINGS: Normal for age cerebral volume. Chronic microvascular ischemic change. No evidence for acute infarction, hemorrhage, mass lesion, hydrocephalus, or extra-axial fluid. Vascular calcification. Calvarium intact. Unremarkable sinuses and mastoids.  Bilateral cataract extraction. Otherwise unremarkable appearing orbits. Similar appearance to priors.  IMPRESSION: No acute intracranial abnormality. Stable exam. 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   Electronically Signed   By: Rolla Flatten M.D.   On: 07/02/2013 00:02    EKG: Independently reviewed- sinus rhythm   Assessment/Plan Active Problems:    TIA (transient ischemic attack)    Hypertension    Hyperlipidemia  TIA Will admit the patient, under telemetry. Obtain MRI/MRA brain, carotid ultrasound, fasting lipid profile, hemoglobin A1c. We'll increase the dose of aspirin to 325 mg by mouth daily.  Hypertension Will hold the metoprolol and amlodipine for permissive hypertension.  Hyperlipidemia Continue Zocor  DVT prophylaxis Lovenox  Code status: Patient is full code  Family discussion: No family at bedside   Time Spent on Admission: 59 minutes  [facility] Pager: [phone] 07/02/2013, 2:44 AM  If 7PM-7AM, please contact night-coverage  [email or web address]  Password [email or web address]

## 2013-07-02 NOTE — Progress Notes (Signed)
Patient was admitted this morning by Dr Darrick Meigs. He presented with complaints of visual changes to his left eye as there was concern for TIA/CVA. His aspirin was increased to 325 mg PO q daily and will undergo stroke workup today. Blood pressures remain elevated. Will increase norvasc to 10 mg PO daily.  Patient reports resolution to visual changes, has not had further focal deficits since admission. Had unremarkable physical exam, notable nonfocal neurologic exam. Will follow up on MRI, echo and carotid dopplers.

## 2013-07-02 NOTE — Discharge Summary (Addendum)
Physician Discharge Summary  Stephen [name] TJQ:[id number] DOB: 03/24/1935 DOA: 07/01/2013  PCP: Delphina Cahill, MD  Admit date: 07/01/2013 Discharge date: 07/02/2013  Time spent: 35 minutes  Recommendations for Outpatient Follow-up:  1. Please follow up on blood pressures  Discharge Diagnoses:  Principal Problem:   Visual changes Active Problems:   HYPERLIPIDEMIA   HYPERTENSION, BENIGN Visual Disturbance can not clinically determine  Discharge Condition: Stable  Diet recommendation: Heart healthy  Filed Weights   07/01/13 2230 07/02/13 1028  Weight: 77.111 kg (170 lb) 77.111 kg (170 lb)    History of present illness:  78 year old male who has a past medical history of Hypertension; Hyperlipidemia; Chest pain; TIA (transient ischemic attack); BPH (benign prostatic hypertrophy); UTI (lower urinary tract infection); Arthritis; Adenomatous colon polyp; GERD (gastroesophageal reflux disease); Hemorrhoids; Allergy; Cataract; Diabetes mellitus without complication; Helicobacter pylori (H. pylori); CKD (chronic kidney disease); and Kidney stones.  Today came to the hospital after patient experienced left-sided visual disturbance while watching TV which lasted for about 5 minutes, patient says that he checked the blood pressure at that time and it was elevated to 194/107. This episode occurred around 10:15 PM. Patient also started having headache which is now improved. Headache was 4/10 in intensity bilateral, occipital area with radiation to the neck. Patient takes metoprolol and amlodipine forward pressure. He was given amlodipine when he came to the ED. He denies chest pain, no shortness of breath. Patient denies seizure activity, no numbness or tingling of upper or lower extremities. Good speech difficulty no trouble swallowing. Patient takes 81 mg aspirin every day.  At this time patient is completely isn't dramatic, CT head was done which did not show any significant abnormality.   Hospital  Course:  Patient is a pleasant 78 year old gentleman with a past medical history of hypertension who was admitted to medicine service on 07/02/2013 presenting with complaints of left-sided visual changes while watching TV. He states episode lasted for about 5 minutes and described as a halo over his left thigh. He denied loss of vision to the involved eye. Patient's initial CT scan was negative. He was further worked up with an MRI of the brain did not reveal an acute infarct. Transthoracic echocardiogram showed an ejection fraction of 60-65% with grade 1 diastolic dysfunction. Carotid ultrasound showed atherosclerotic plaquing carotid arteries right side greater than left characterized as smooth and heterogeneous. Radiology reporting this by can be more unstable. He had carotid ultrasound performed at 2010 which showed mild plaque formation at the carotid bifurcations and proximal right ICA. Radiology reporting a 50-60% diameter stenosis of the proximal right ICA. Patient's transient visual disturbances likely unrelated TIA. He did report followup with neurology back in 2004 ultrasound findings. He expresses desire to followup with neurology in the outpatient setting regarding todays ultrasound findings rather than staying in the hospital for a neurology consultation. He was set up to see Dr Leonie Man prior to discharge.    Discharge Exam: Filed Vitals:   07/02/13 1508  BP: 149/67  Pulse: 64  Temp: 98.1 F (36.7 C)  Resp: 16    General: Patient is in no acute distress, awake alert and oriented Cardiovascular: Regular rate and rhythm normal S1-S2 Respiratory: Clear to auscultation bilaterally Abdomen: Soft nontender nondistended Extremity: No edema Neurological: Nonfocal  Discharge Instructions You were cared for by a hospitalist during your hospital stay. If you have any questions about your discharge medications or the care you received while you were in the hospital after you  are discharged, you  can call the unit and asked to speak with the hospitalist on call if the hospitalist that took care of you is not available. Once you are discharged, your primary care physician will handle any further medical issues. Please note that NO REFILLS for any discharge medications will be authorized once you are discharged, as it is imperative that you return to your primary care physician (or establish a relationship with a primary care physician if you do not have one) for your aftercare needs so that they can reassess your need for medications and monitor your lab values.      Discharge Instructions   Call MD for:  difficulty breathing, headache or visual disturbances    Complete by:  As directed      Call MD for:  extreme fatigue    Complete by:  As directed      Call MD for:  persistant dizziness or light-headedness    Complete by:  As directed      Call MD for:  persistant nausea and vomiting    Complete by:  As directed      Call MD for:  redness, tenderness, or signs of infection (pain, swelling, redness, odor or green/yellow discharge around incision site)    Complete by:  As directed      Call MD for:  temperature >100.4    Complete by:  As directed      Diet - low sodium heart healthy    Complete by:  As directed      Increase activity slowly    Complete by:  As directed             Medication List    STOP taking these medications       aspirin EC 81 MG tablet  Replaced by:  aspirin 325 MG tablet      TAKE these medications       amLODipine 5 MG tablet  Commonly known as:  NORVASC  Take 5 mg by mouth at bedtime.     aspirin 325 MG tablet  Take 1 tablet (325 mg total) by mouth daily.     BENEFIBER PO  Take 10 mLs by mouth 2 (two) times daily.     Berberine Chloride Powd  Take 500 mg by mouth 2 (two) times daily with a meal.     Co Q 10 100 MG Caps  Take 1 capsule by mouth 2 (two) times daily.     CULTURELLE DIGESTIVE HEALTH PO  Take 1 capsule by mouth at  bedtime.     DULCOLAX STOOL SOFTENER PO  Take 1 capsule by mouth 2 (two) times daily. Takes twice daily     Gymnema Sylvestris Leaf Powd  Take 400 mg by mouth 2 (two) times daily with a meal. TABLET FORM     Magnesium 200 MG Tabs  Take 1 tablet by mouth 2 (two) times daily.     metoprolol 50 MG tablet  Commonly known as:  LOPRESSOR  Take 50 mg by mouth 2 (two) times daily.     metoprolol succinate 25 MG 24 hr tablet  Commonly known as:  TOPROL-XL  Take 25 mg by mouth every morning.     multivitamin with minerals Tabs tablet  Take 1 tablet by mouth daily.     niacin 1000 MG CR tablet  Commonly known as:  NIASPAN  Take 1,000 mg by mouth at bedtime.     PROBIOTIC FORMULA Caps  Take  1 capsule by mouth every morning.     rosuvastatin 5 MG tablet  Commonly known as:  CRESTOR  Take 2.5 mg by mouth at bedtime.     timolol 0.5 % ophthalmic solution  Commonly known as:  BETIMOL  Place 1 drop into both eyes daily.     V-R CHROMIUM PICOLINATE 200 MCG Tabs  Generic drug:  Chromium Picolinate  Take 1 tablet by mouth 2 (two) times daily.     vitamin C 1000 MG tablet  Take 1,000 mg by mouth 2 (two) times daily.     Vitamin D3 2000 UNITS Tabs  Take 1 capsule by mouth 2 (two) times daily.     vitamin E 400 UNIT capsule  Generic drug:  vitamin E  Take 400 Units by mouth 2 (two) times daily.     vitamin k 100 MCG tablet  Take 100 mcg by mouth every morning.       Allergies  Allergen Reactions  . Levofloxacin Other (See Comments)    Severe headache  . Benicar Hct [Olmesartan Medoxomil-Hctz] Other (See Comments)    reanal  . Celebrex [Celecoxib]     confusion  . Hydralazine   . Metronidazole Other (See Comments)    Headache  . Olmesartan Medoxomil Other (See Comments)    REACTION: progressive renal insufficiency  . Clarithromycin Rash    Bad Headache   Follow-up Information   Follow up with Forbes Cellar, MD In 2 weeks.   Specialties:  Neurology, Radiology    Contact information:   [address] [place] [address] [phone]        The results of significant diagnostics from this hospitalization (including imaging, microbiology, ancillary and laboratory) are listed below for reference.    Significant Diagnostic Studies: Dg Abd 1 View  06/05/2013   CLINICAL DATA:  Nephrolithiasis  EXAM: ABDOMEN - 1 VIEW  COMPARISON:  November 01, 2012  FINDINGS: There is a stable phlebolith in the left pelvis. There is a 7 mm calcification which appears stable compared to the prior study either in or overlying the right kidney. Slightly superior to this calcification, there is a smaller 2 mm calcification, probably right kidney. No other abnormal calcifications are identified. There is moderate stool in the colon. The bowel gas pattern is unremarkable. No obstruction or free air is seen on this supine examination.  IMPRESSION: Probable 7 mm calculus in the right kidney, unchanged in position compared to prior study. There is a 2 mm apparent calculus slightly superior to this larger calcification. These calcifications are felt to reside most likely in the right kidney and represent stable renal calculi. No new calcifications seen. Moderate stool in colon. Bowel gas pattern unremarkable.   Electronically Signed   By: Lowella Grip M.D.   On: 06/05/2013 15:59   Ct Head Wo Contrast  07/02/2013   CLINICAL DATA:  Elevated blood pressure with blurred vision.  EXAM: CT HEAD WITHOUT CONTRAST  TECHNIQUE: Contiguous axial images were obtained from the base of the skull through the vertex without contrast.  COMPARISON:  12/16/2012.  FINDINGS: Normal for age cerebral volume. Chronic microvascular ischemic change. No evidence for acute infarction, hemorrhage, mass lesion, hydrocephalus, or extra-axial fluid. Vascular calcification. Calvarium intact. Unremarkable sinuses and mastoids.  Bilateral cataract extraction. Otherwise unremarkable appearing orbits. Similar  appearance to priors.  IMPRESSION: No acute intracranial abnormality. Stable exam. 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   Electronically Signed   By: Rolla Flatten M.D.   On: 07/02/2013 00:02  Mr Jodene Nam Head Wo Contrast  07/02/2013   CLINICAL DATA:  Left sided blurred vision lasting for 5 min. History of high blood pressure and hyperlipidemia.  EXAM: MRI HEAD WITHOUT CONTRAST  MRA HEAD WITHOUT CONTRAST  TECHNIQUE: Multiplanar, multiecho pulse sequences of the brain and surrounding structures were obtained without intravenous contrast. Angiographic images of the head were obtained using MRA technique without contrast.  COMPARISON:  07/01/2013.  FINDINGS: MRI HEAD FINDINGS  No acute infarct.  No intracranial hemorrhage.  Mild small vessel disease type changes.  No hydrocephalus.  No intracranial mass lesion noted on this unenhanced exam. Major intracranial vascular structures are patent.  Minimal partial opacification mastoid air cells greater on right. No obstructing lesion seen causing eustachian tube dysfunction.  Cervical medullary junction, pituitary region, pineal region and orbital structures unremarkable.  MRA HEAD FINDINGS  Anterior circulation without medium or large size vessel significant stenosis or occlusion.  Fetal type contribution to the posterior cerebral arteries.  Middle cerebral artery mild to slightly moderate branch vessel irregularity and narrowing.  Left posterior inferior cerebellar artery is not visualized. Two left anterior inferior cerebellar arteries are noted.  Right anterior inferior cerebellar artery is not visualized.  Moderate-to-marked regions of narrowing involving the posterior cerebral artery bilaterally.  No aneurysm noted  IMPRESSION: MRI HEAD:  No acute infarct.  Mild small vessel disease type changes.  Minimal partial opacification mastoid air cells greater on right.  MRA HEAD FINDINGS  Intracranial atherosclerotic type changes most notable involving branch vessels and more notable  posterior circulation as detailed above.   Electronically Signed   By: Chauncey Cruel M.D.   On: 07/02/2013 08:45   Mri Brain Without Contrast  07/02/2013   CLINICAL DATA:  Left sided blurred vision lasting for 5 min. History of high blood pressure and hyperlipidemia.  EXAM: MRI HEAD WITHOUT CONTRAST  MRA HEAD WITHOUT CONTRAST  TECHNIQUE: Multiplanar, multiecho pulse sequences of the brain and surrounding structures were obtained without intravenous contrast. Angiographic images of the head were obtained using MRA technique without contrast.  COMPARISON:  07/01/2013.  FINDINGS: MRI HEAD FINDINGS  No acute infarct.  No intracranial hemorrhage.  Mild small vessel disease type changes.  No hydrocephalus.  No intracranial mass lesion noted on this unenhanced exam. Major intracranial vascular structures are patent.  Minimal partial opacification mastoid air cells greater on right. No obstructing lesion seen causing eustachian tube dysfunction.  Cervical medullary junction, pituitary region, pineal region and orbital structures unremarkable.  MRA HEAD FINDINGS  Anterior circulation without medium or large size vessel significant stenosis or occlusion.  Fetal type contribution to the posterior cerebral arteries.  Middle cerebral artery mild to slightly moderate branch vessel irregularity and narrowing.  Left posterior inferior cerebellar artery is not visualized. Two left anterior inferior cerebellar arteries are noted.  Right anterior inferior cerebellar artery is not visualized.  Moderate-to-marked regions of narrowing involving the posterior cerebral artery bilaterally.  No aneurysm noted  IMPRESSION: MRI HEAD:  No acute infarct.  Mild small vessel disease type changes.  Minimal partial opacification mastoid air cells greater on right.  MRA HEAD FINDINGS  Intracranial atherosclerotic type changes most notable involving branch vessels and more notable posterior circulation as detailed above.   Electronically Signed   By:  Chauncey Cruel M.D.   On: 07/02/2013 08:45   US Carotid Bilateral  07/02/2013   CLINICAL DATA:  TIA.  Hypertension.  EXAM: BILATERAL CAROTID DUPLEX ULTRASOUND  TECHNIQUE: Pearline Cables scale imaging, color Doppler and duplex ultrasound  were performed of bilateral carotid and vertebral arteries in the neck.  COMPARISON:  03/06/2008  FINDINGS: Criteria: Quantification of carotid stenosis is based on velocity parameters that correlate the residual internal carotid diameter with NASCET-based stenosis levels, using the diameter of the distal internal carotid lumen as the denominator for stenosis measurement.  The following velocity measurements were obtained:  RIGHT  ICA:  109 cm/sec  CCA:  79 cm/sec  SYSTOLIC ICA/CCA RATIO:  1.4  DIASTOLIC ICA/CCA RATIO:  2.4  ECA:  109 cm/sec  LEFT  ICA:  71 cm/sec  CCA:  74 cm/sec  SYSTOLIC ICA/CCA RATIO:  1.0  DIASTOLIC ICA/CCA RATIO:  1.5  ECA:  73 cm/sec  RIGHT CAROTID ARTERY: Intimal thickening in the right common carotid artery with mild plaque at the carotid bulb. Plaque extends into the right internal carotid artery. The internal carotid artery plaque is smooth and heterogeneous. No significant stenosis in the right internal carotid artery.  RIGHT VERTEBRAL ARTERY: Antegrade flow and normal waveform in the right vertebral artery.  LEFT CAROTID ARTERY: Intimal thickening throughout the left common carotid artery. Small amount of plaque at the carotid bulb and extending into the internal carotid artery. No significant stenosis in the left internal carotid artery. The plaque at the left carotid bulb is mildly irregular.  LEFT VERTEBRAL ARTERY: Antegrade flow and normal waveform in the left vertebral artery.  IMPRESSION: Atherosclerotic plaque in the carotid arteries, right side greater the left. The plaque in the right internal carotid artery is smooth and heterogeneous. This type of plaque can be more unstable or "vulnerable" than calcified plaque.  Estimated degree of stenosis in the  internal carotid arteries is less than 50% bilaterally.  Patent vertebral arteries.   Electronically Signed   By: Markus Daft M.D.   On: 07/02/2013 11:01    Microbiology: No results found for this or any previous visit (from the past 240 hour(s)).   Labs: Basic Metabolic Panel:  Recent Labs Lab 07/01/13 2252  NA 141  K 4.3  CL 98  CO2 29  GLUCOSE 119*  BUN 23  CREATININE 1.16  CALCIUM 9.1   Liver Function Tests:  Recent Labs Lab 07/01/13 2252  AST 15  ALT 20  ALKPHOS 102  BILITOT 0.2*  PROT 6.9  ALBUMIN 3.9   No results found for this basename: LIPASE, AMYLASE,  in the last 168 hours No results found for this basename: AMMONIA,  in the last 168 hours CBC:  Recent Labs Lab 07/01/13 2252  WBC 8.1  NEUTROABS 4.2  HGB 15.8  HCT 45.3  MCV 91.3  PLT 174   Cardiac Enzymes:  Recent Labs Lab 07/01/13 2252  TROPONINI <0.30   BNP: BNP (last 3 results)  Recent Labs  12/02/12 0630  PROBNP 15.5   CBG:  Recent Labs Lab 07/02/13 0858 07/02/13 1146  GLUCAP 110* 120*       Signed:  [facility] 07/02/2013, 3:19 PM

## 2013-07-02 NOTE — Care Management Utilization Note (Signed)
UR completed 

## 2013-07-02 NOTE — ED Notes (Signed)
Pt transported to MRI 

## 2013-07-02 NOTE — Progress Notes (Signed)
  Echocardiogram 2D Echocardiogram has been performed.  Stephen [name] 07/02/2013, 12:27 PM

## 2013-07-08 DIAGNOSIS — E119 Type 2 diabetes mellitus without complications: Secondary | ICD-10-CM | POA: Diagnosis not present

## 2013-07-08 DIAGNOSIS — I10 Essential (primary) hypertension: Secondary | ICD-10-CM | POA: Diagnosis not present

## 2013-07-08 DIAGNOSIS — E785 Hyperlipidemia, unspecified: Secondary | ICD-10-CM | POA: Diagnosis not present

## 2013-07-31 ENCOUNTER — Ambulatory Visit (INDEPENDENT_AMBULATORY_CARE_PROVIDER_SITE_OTHER): Payer: Medicare Other | Admitting: Neurology

## 2013-07-31 ENCOUNTER — Encounter: Payer: Self-pay | Admitting: Neurology

## 2013-07-31 VITALS — BP 164/78 | HR 55 | Ht 68.0 in | Wt 180.0 lb

## 2013-07-31 DIAGNOSIS — I6521 Occlusion and stenosis of right carotid artery: Secondary | ICD-10-CM

## 2013-07-31 DIAGNOSIS — G459 Transient cerebral ischemic attack, unspecified: Secondary | ICD-10-CM

## 2013-07-31 DIAGNOSIS — I6529 Occlusion and stenosis of unspecified carotid artery: Secondary | ICD-10-CM | POA: Diagnosis not present

## 2013-07-31 NOTE — Progress Notes (Signed)
[facility] [address] [place]. [place] [address] [phone]       OFFICE CONSULT NOTE  Mr. Stephen [name] Date of Birth:  12-04-1935 Medical Record Number:  [id number]   Referring MD:  Wende Neighbors  Reason for Referral:  Carotid plaque HPI: 26 year caucasian male has a remote history of TIAs in 2010 at that time evaluation was fairly unremarkable except for mild extracranial carotid plaque but without hemodynamically significant stenosis. The patient did not follow up with me after that. Recently he was admitted to [facility] last month with significantly elevated blood pressure. At that time he underwent evaluation in the hospital which included carotid ultrasound on 07/02/13 which showed atherosclerotic plaque at both carotid bifurcations greater on the right.Tthe plaque was described as being heterogenous and smooth and more vulnerable suggesting increase stroke risk and hence the patient was referred back to me. He subsequently also underwent MRI scan of the brain on 07/02/2013 which I have personally reviewed shows no evidence of acute stroke only mild changes of age-related chronic back to [place] ischemia. MRA of the brain shows only minor branch vessel type atherosclerotic changes without any large vessel occlusion. Patient denies any recent symptoms suggestive of stroke or TIA. He categorically denies any loss of vision slurred speech gait or balance problems. He had been taking aspirin 81 mg daily since his previous TIA and 20 and and this dose was recently increased to 325 mg which is tolerating well. He states his blood pressure is now under good control  after recent medication changes and he had his lipid profile checked 3 months ago by Dr. Nevada Crane and was fine. He has a very active lifestyle he and has a home gym and exercises 5 days a week. He has no neurological complaints.  ROS:   14 system review of systems is positive for snoring, diarrhea, constipation,  headache and all other systems negative PMH:  Past Medical History  Diagnosis Date  . Hypertension   . Hyperlipidemia   . Chest pain   . TIA (transient ischemic attack)     "side affect from medication had symptoms of TIA"  . BPH (benign prostatic hypertrophy)   . UTI (lower urinary tract infection)   . Arthritis   . Adenomatous colon polyp   . GERD (gastroesophageal reflux disease)   . Hemorrhoids   . Allergy   . Cataract     bil cataracts removed  . Diabetes mellitus without complication     controlled with diet  . Helicobacter pylori (H. pylori)   . CKD (chronic kidney disease)   . Kidney stones     Social History:  History   Social History  . Marital Status: Widowed    Spouse Name: N/A    Number of Children: 0  . Years of Education: 12th   Occupational History  . Retired from [employer] work    Social History Main Topics  . Smoking status: Never Smoker   . Smokeless tobacco: Never Used  . Alcohol Use: No  . Drug Use: No  . Sexual Activity: No   Other Topics Concern  . Not on file   Social History Narrative   Lives in [place] by himself   Regular exercise    Medications:   Current Outpatient Prescriptions on File Prior to Visit  Medication Sig Dispense Refill  . amLODipine (NORVASC) 5 MG tablet Take 5 mg by mouth at bedtime.      . Ascorbic  Acid (VITAMIN C) 1000 MG tablet Take 1,000 mg by mouth 2 (two) times daily.      Marland Kitchen aspirin 325 MG tablet Take 1 tablet (325 mg total) by mouth daily.  30 tablet  1  . Berberine Chloride POWD Take 500 mg by mouth 2 (two) times daily with a meal.      . Cholecalciferol (VITAMIN D3) 2000 UNITS TABS Take 1 capsule by mouth 2 (two) times daily.      . Chromium Picolinate (V-R CHROMIUM PICOLINATE) 200 MCG TABS Take 1 tablet by mouth 2 (two) times daily.        . Coenzyme Q10 (CO Q 10) 100 MG CAPS Take 1 capsule by mouth 2 (two) times daily.      Mariane Baumgarten Sodium (DULCOLAX STOOL SOFTENER PO) Take 1 capsule by mouth 2  (two) times daily. Takes twice daily      . Gymnema Sylvestris Leaf POWD Take 400 mg by mouth 2 (two) times daily with a meal. TABLET FORM      . Lactobacillus-Inulin (CULTURELLE DIGESTIVE HEALTH PO) Take 1 capsule by mouth at bedtime.      . Magnesium 200 MG TABS Take 1 tablet by mouth 2 (two) times daily.        . metoprolol (LOPRESSOR) 50 MG tablet Take 50 mg by mouth 2 (two) times daily.      . metoprolol succinate (TOPROL-XL) 25 MG 24 hr tablet Take 25 mg by mouth every morning.      . Multiple Vitamin (MULTIVITAMIN WITH MINERALS) TABS tablet Take 1 tablet by mouth daily.      . niacin (NIASPAN) 1000 MG CR tablet Take 1,000 mg by mouth at bedtime.       . Probiotic Product (PROBIOTIC FORMULA) CAPS Take 1 capsule by mouth every morning.       . rosuvastatin (CRESTOR) 5 MG tablet Take 2.5 mg by mouth at bedtime.      . timolol (BETIMOL) 0.5 % ophthalmic solution Place 1 drop into both eyes daily.      . vitamin E (VITAMIN E) 400 UNIT capsule Take 400 Units by mouth 2 (two) times daily.        . vitamin k 100 MCG tablet Take 100 mcg by mouth every morning.       . Wheat Dextrin (BENEFIBER PO) Take 10 mLs by mouth 2 (two) times daily.       No current facility-administered medications on file prior to visit.    Allergies:   Allergies  Allergen Reactions  . Levofloxacin Other (See Comments)    Severe headache  . Benicar Hct [Olmesartan Medoxomil-Hctz] Other (See Comments)    reanal  . Celebrex [Celecoxib]     confusion  . Hydralazine   . Metronidazole Other (See Comments)    Headache  . Olmesartan Medoxomil Other (See Comments)    REACTION: progressive renal insufficiency  . Clarithromycin Rash    Bad Headache    Physical Exam General: well developed, well nourished, seated, in no evident distress Head: head normocephalic and atraumatic. Orohparynx benign Neck: supple with no carotid or supraclavicular bruits Cardiovascular: regular rate and rhythm, no  murmurs Musculoskeletal: no deformity Skin:  no rash/petichiae Vascular:  Normal pulses all extremities Filed Vitals:   07/31/13 1330  BP: 164/78  Pulse: 55    Neurologic Exam Mental Status: Awake and fully alert. Oriented to place and time. Recent and remote memory intact. Attention span, concentration and fund of knowledge appropriate. Mood and  affect appropriate.  Cranial Nerves: Fundoscopic exam reveals sharp disc margins. Pupils equal, briskly reactive to light. Extraocular movements full without nystagmus. Visual fields full to confrontation. Hearing intact. Facial sensation intact. Face, tongue, palate moves normally and symmetrically.  Motor: Normal bulk and tone. Normal strength in all tested extremity muscles. Sensory.: intact to tough and pinprick and vibratory.  Coordination: Rapid alternating movements normal in all extremities. Finger-to-nose and heel-to-shin performed accurately bilaterally. Gait and Station: Arises from chair without difficulty. Stance is normal. Gait demonstrates normal stride length and balance . Able to heel, toe and tandem walk without difficulty.  Reflexes: 1+ and symmetric. Toes downgoing.   NIHSS  0 Modified Rankin  0  ASSESSMENT: 34 year Caucasian male with remote history of TIA and cerebrovascular disease with recent carotid ultrasound showing mild heterogenous plaque at both carotid bifurcations but without hemodynamically significant stenosis. Vascular risk factors of hypertension, diabetes and cerebrovascular disease.    PLAN:  I had a long discussion with the patient with regards to his remote history of TIAs, discuss results of recent carotid ultrasound and brain MRI and MRA and answered questions. I agree with increasing aspirin from 81 to treat 25 mg daily. Check  transcanal Doppler emboli monitoring and if significant silent micro-emboli are noted on the right side may consider adding Plavix for 3 months. Continue aggressive blood pressure  control with good goal below 130/90 lipids with LDL cholesterol goal below 100 mg percent. Patient has not had any TIAs or stroke symptoms for more than 4 years hence no routine follow up as necessary with me. Continue follow up with primary care physician and cardiologist. No followup appointment was made with me.   Note: This document was prepared with digital dictation and possible smart phrase technology. Any transcriptional errors that result from this process are unintentional.

## 2013-07-31 NOTE — Patient Instructions (Signed)
I had a long discussion with the patient with regards to his remote history of TIAs, discuss results of recent carotid ultrasound and brain MRI and MRA and answered questions. I agree with increasing aspirin from 81 to treat 25 mg daily. Check  transcanal Doppler emboli monitoring and if significant silent micro-emboli are noted on the right side may consider adding Plavix for 3 months. Continue aggressive blood pressure control with good goal below 130/90 lipids with LDL cholesterol goal below 100 mg percent. Patient has not had any TIAs or stroke symptoms for more than 4 years hence no routine follow up as necessary with me. Continue follow up with primary care physician and cardiologist. No followup appointment was made with me.

## 2013-08-16 DIAGNOSIS — H4011X0 Primary open-angle glaucoma, stage unspecified: Secondary | ICD-10-CM | POA: Diagnosis not present

## 2013-08-29 DIAGNOSIS — E119 Type 2 diabetes mellitus without complications: Secondary | ICD-10-CM | POA: Diagnosis not present

## 2013-08-29 DIAGNOSIS — I10 Essential (primary) hypertension: Secondary | ICD-10-CM | POA: Diagnosis not present

## 2013-08-29 DIAGNOSIS — E785 Hyperlipidemia, unspecified: Secondary | ICD-10-CM | POA: Diagnosis not present

## 2013-09-02 DIAGNOSIS — E119 Type 2 diabetes mellitus without complications: Secondary | ICD-10-CM | POA: Diagnosis not present

## 2013-09-02 DIAGNOSIS — E785 Hyperlipidemia, unspecified: Secondary | ICD-10-CM | POA: Diagnosis not present

## 2013-09-02 DIAGNOSIS — I10 Essential (primary) hypertension: Secondary | ICD-10-CM | POA: Diagnosis not present

## 2013-09-05 ENCOUNTER — Ambulatory Visit (INDEPENDENT_AMBULATORY_CARE_PROVIDER_SITE_OTHER): Payer: Self-pay

## 2013-09-05 ENCOUNTER — Ambulatory Visit (INDEPENDENT_AMBULATORY_CARE_PROVIDER_SITE_OTHER): Payer: Medicare Other

## 2013-09-05 DIAGNOSIS — I6529 Occlusion and stenosis of unspecified carotid artery: Secondary | ICD-10-CM | POA: Diagnosis not present

## 2013-09-05 DIAGNOSIS — I6521 Occlusion and stenosis of right carotid artery: Secondary | ICD-10-CM

## 2013-09-05 DIAGNOSIS — Z0289 Encounter for other administrative examinations: Secondary | ICD-10-CM

## 2013-09-13 DIAGNOSIS — H539 Unspecified visual disturbance: Secondary | ICD-10-CM | POA: Diagnosis not present

## 2013-09-14 ENCOUNTER — Encounter: Payer: Self-pay | Admitting: Internal Medicine

## 2013-09-16 ENCOUNTER — Telehealth: Payer: Self-pay | Admitting: Neurology

## 2013-09-16 NOTE — Telephone Encounter (Signed)
Patient calling for Doppler and MRI results.  Please call and advise.

## 2013-09-16 NOTE — Telephone Encounter (Signed)
I called pt and he had event of 61min duration when watching TV on 09-13-13 at 1545.  L eye wavy vision.  Went to opthamologist and eye exam good, noted Bp elevation 190/90, 180/90, and has decreased since.  Taking aspirin 325mg  po daily, and vitamin E 400u po bid.  Asking about doppler results, and those not back yet. Contacted pcp about Bp.

## 2013-09-16 NOTE — Telephone Encounter (Signed)
Stephen [name] has answered all question the pt had and sent Dr. Leonie Man phone note regarding wanting results.

## 2013-09-16 NOTE — Telephone Encounter (Signed)
Patient had an event on Friday, where is experienced blurred vision.  Saw Opthamologist and was informed blood pressure was 190/90 and it was taken again 10 minutes later with a reading of 180/90.  Normally his readings are 134/72 with average heart rate 55.  Please call and advise.

## 2013-09-26 ENCOUNTER — Telehealth: Payer: Self-pay | Admitting: *Deleted

## 2013-10-02 ENCOUNTER — Telehealth: Payer: Self-pay | Admitting: *Deleted

## 2013-10-02 NOTE — Telephone Encounter (Signed)
I called and LMVM for pt to return call for [place] results.

## 2013-10-02 NOTE — Telephone Encounter (Signed)
Doppler results was giving by Stephen [name]

## 2013-10-02 NOTE — Telephone Encounter (Signed)
Pt returned call.  I relayed that TCD showed moderate narrowing of artery in back on R , unchanged from prior MRA.  Mild hardening of tiny blood vessel throughout the brain.  Repeat in 6 months.   Pt verbalized understanding.

## 2014-01-02 DIAGNOSIS — I10 Essential (primary) hypertension: Secondary | ICD-10-CM | POA: Diagnosis not present

## 2014-01-02 DIAGNOSIS — E119 Type 2 diabetes mellitus without complications: Secondary | ICD-10-CM | POA: Diagnosis not present

## 2014-01-02 DIAGNOSIS — E785 Hyperlipidemia, unspecified: Secondary | ICD-10-CM | POA: Diagnosis not present

## 2014-01-06 DIAGNOSIS — I10 Essential (primary) hypertension: Secondary | ICD-10-CM | POA: Diagnosis not present

## 2014-01-06 DIAGNOSIS — E119 Type 2 diabetes mellitus without complications: Secondary | ICD-10-CM | POA: Diagnosis not present

## 2014-01-27 ENCOUNTER — Encounter: Payer: Self-pay | Admitting: Internal Medicine

## 2014-01-27 DIAGNOSIS — J069 Acute upper respiratory infection, unspecified: Secondary | ICD-10-CM | POA: Diagnosis not present

## 2014-02-07 DIAGNOSIS — D1801 Hemangioma of skin and subcutaneous tissue: Secondary | ICD-10-CM | POA: Diagnosis not present

## 2014-02-07 DIAGNOSIS — L821 Other seborrheic keratosis: Secondary | ICD-10-CM | POA: Diagnosis not present

## 2014-02-07 DIAGNOSIS — D225 Melanocytic nevi of trunk: Secondary | ICD-10-CM | POA: Diagnosis not present

## 2014-02-07 DIAGNOSIS — L812 Freckles: Secondary | ICD-10-CM | POA: Diagnosis not present

## 2014-02-19 DIAGNOSIS — H4011X2 Primary open-angle glaucoma, moderate stage: Secondary | ICD-10-CM | POA: Diagnosis not present

## 2014-03-07 DIAGNOSIS — H10022 Other mucopurulent conjunctivitis, left eye: Secondary | ICD-10-CM | POA: Diagnosis not present

## 2014-03-07 DIAGNOSIS — J069 Acute upper respiratory infection, unspecified: Secondary | ICD-10-CM | POA: Diagnosis not present

## 2014-03-07 DIAGNOSIS — H1089 Other conjunctivitis: Secondary | ICD-10-CM | POA: Diagnosis not present

## 2014-03-07 DIAGNOSIS — E119 Type 2 diabetes mellitus without complications: Secondary | ICD-10-CM | POA: Diagnosis not present

## 2014-03-07 DIAGNOSIS — I10 Essential (primary) hypertension: Secondary | ICD-10-CM | POA: Diagnosis not present

## 2014-03-11 ENCOUNTER — Encounter: Payer: Self-pay | Admitting: Internal Medicine

## 2014-03-11 ENCOUNTER — Ambulatory Visit: Payer: Medicare Other | Admitting: Internal Medicine

## 2014-03-11 ENCOUNTER — Ambulatory Visit (INDEPENDENT_AMBULATORY_CARE_PROVIDER_SITE_OTHER): Payer: Medicare Other | Admitting: Internal Medicine

## 2014-03-11 VITALS — BP 142/70 | HR 64 | Ht 66.75 in | Wt 187.4 lb

## 2014-03-11 DIAGNOSIS — R1013 Epigastric pain: Secondary | ICD-10-CM | POA: Diagnosis not present

## 2014-03-11 DIAGNOSIS — K2970 Gastritis, unspecified, without bleeding: Secondary | ICD-10-CM | POA: Diagnosis not present

## 2014-03-11 DIAGNOSIS — K2990 Gastroduodenitis, unspecified, without bleeding: Secondary | ICD-10-CM | POA: Diagnosis not present

## 2014-03-11 DIAGNOSIS — K219 Gastro-esophageal reflux disease without esophagitis: Secondary | ICD-10-CM | POA: Diagnosis not present

## 2014-03-11 DIAGNOSIS — Z86010 Personal history of colonic polyps: Secondary | ICD-10-CM

## 2014-03-11 MED ORDER — MOVIPREP 100 G PO SOLR: 1.0000 | Freq: Once | ORAL | Status: DC

## 2014-03-11 NOTE — Progress Notes (Signed)
HISTORY OF PRESENT ILLNESS:  Stephen [name] is a 79 y.o. male with hyperlipidemia, hypertension, arthritis, kidney stones, H. pylori associated gastritis, and adenomatous colon polyps (multiple and advanced). He presents today with recent complaints of GERD, concerns over partially treated Helicobacter pylori, and surveillance colonoscopy. Patient has a history of multiple and advanced adenomas as well as a family history of colon cancer in his brother. He underwent index colonoscopy February 2009 with follow-up October 2009, February 2010, 2011, and February 2013. His most recent examination revealed one 5 mm adenoma. Follow-up in 3 years recommended. The patient's GI review of systems is related to constipation secondary to blood pressure medication. No bleeding. In 2009 he had gastritis with associated H. pylori. He did not tolerate a complete course of Prevpac. He is concerned about persistent Helicobacter pylori. In addition recent problems with significant dyspeptic symptoms and reflux symptoms for which she had been using antacids with improvement. Doing better more recently off antacids. He denies weight loss. He has had bloating and gas. His overall medical health is stable since his last procedures. He remains active  REVIEW OF SYSTEMS:  All non-GI ROS negative except for sinus and allergies (as an upper respiratory infection now for which she is on Z-Pak)   Past Medical History  Diagnosis Date  . Hypertension   . Hyperlipidemia   . Chest pain   . TIA (transient ischemic attack)     "side affect from medication had symptoms of TIA"  . BPH (benign prostatic hypertrophy)   . UTI (lower urinary tract infection)   . Arthritis   . Adenomatous colon polyp   . GERD (gastroesophageal reflux disease)   . Hemorrhoids   . Allergy   . Cataract     bil cataracts removed  . Diabetes mellitus without complication     controlled with diet  . Helicobacter pylori (H. pylori)   . CKD (chronic  kidney disease)   . Kidney stones   . Diverticulosis   . Glaucoma     Past Surgical History  Procedure Laterality Date  . Cataract extraction Bilateral   . Lithotripsy    . Colonoscopy    . Esophagogastroduodenoscopy endoscopy    . Cystoscopy with stent placement Left 05/01/2012    Procedure: CYSTOSCOPY WITH URETEROSCOPY AND STONE EXTRACTION, LEFT STENT PLACEMENT ;  Surgeon: Malka So, MD;  Location: [facility];  Service: Urology;  Laterality: Left;  STONE EXTRACTION  STENT PLACEMENT      Social History [name]  reports that he has never smoked. He has never used smokeless tobacco. He reports that he does not drink alcohol or use illicit drugs.  family history includes Colon cancer in his brother; Colon polyps in his brother; Diabetes in his brother; Heart attack in his father; Heart disease in his brother; Lung cancer in his brother; Pancreatic cancer in his brother; Prostate cancer in his brother. There is no history of Esophageal cancer or Stomach cancer.  Allergies  Allergen Reactions  . Levofloxacin Other (See Comments)    Severe headache  . Benicar Hct [Olmesartan Medoxomil-Hctz] Other (See Comments)    reanal  . Celebrex [Celecoxib]     confusion  . Hydralazine   . Metronidazole Other (See Comments)    Headache  . Olmesartan Medoxomil Other (See Comments)    REACTION: progressive renal insufficiency  . Propoxyphene Napsylate [Propoxyphene] Other (See Comments)    With Acetaminophen, severe headache and trouble urinating  . Clarithromycin Rash  Bad Headache       PHYSICAL EXAMINATION: Vital signs: BP 142/70 mmHg  Pulse 64  Ht 5' 6.75" (1.695 m)  Wt 187 lb 6 oz (84.993 kg)  BMI 29.58 kg/m2  Constitutional: generally well-appearing, no acute distress Psychiatric: alert and oriented x3, cooperative Eyes: extraocular movements intact, anicteric, conjunctiva pink Mouth: oral pharynx moist, no lesions Neck: supple no lymphadenopathy Cardiovascular:  heart regular rate and rhythm, no murmur Lungs: clear to auscultation bilaterally Abdomen: soft, nontender, nondistended, no obvious ascites, no peritoneal signs, normal bowel sounds, no organomegaly Rectal: Deferred until colonoscopy Extremities: no lower extremity edema bilaterally Skin: no lesions on visible extremities Neuro: No focal deficits.   ASSESSMENT:  #1. GERD and recent dyspepsia #2. History of H. pylori associated gastritis. Partially treated #3. History of multiple and advanced adenomas. Due for surveillance #4. Family history of colon cancer in his brother   PLAN:  #1. Surveillance colonoscopy.The nature of the procedure, as well as the risks, benefits, and alternatives were carefully and thoroughly reviewed with the patient. Ample time for discussion and questions allowed. The patient understood, was satisfied, and agreed to proceed. #2. Upper endoscopy with biopsies to evaluate recent dyspeptic symptoms and assess for persistent Helicobacter pylori. Rule out ulcer.The nature of the procedure, as well as the risks, benefits, and alternatives were carefully and thoroughly reviewed with the patient. Ample time for discussion and questions allowed. The patient understood, was satisfied, and agreed to proceed.

## 2014-03-11 NOTE — Patient Instructions (Signed)

## 2014-03-18 ENCOUNTER — Telehealth: Payer: Self-pay | Admitting: Internal Medicine

## 2014-03-26 ENCOUNTER — Encounter: Payer: Medicare Other | Admitting: Internal Medicine

## 2014-03-26 NOTE — Telephone Encounter (Signed)
Spoke with patient who was able to go ahead and purchase his Moviprep.

## 2014-03-28 ENCOUNTER — Encounter: Payer: Self-pay | Admitting: Internal Medicine

## 2014-03-28 ENCOUNTER — Ambulatory Visit (AMBULATORY_SURGERY_CENTER): Payer: Medicare Other | Admitting: Internal Medicine

## 2014-03-28 VITALS — BP 143/84 | HR 57 | Temp 97.2°F | Resp 14 | Ht 66.75 in | Wt 187.0 lb

## 2014-03-28 DIAGNOSIS — R1013 Epigastric pain: Secondary | ICD-10-CM

## 2014-03-28 DIAGNOSIS — Z86010 Personal history of colonic polyps: Secondary | ICD-10-CM

## 2014-03-28 DIAGNOSIS — K2970 Gastritis, unspecified, without bleeding: Secondary | ICD-10-CM | POA: Diagnosis not present

## 2014-03-28 DIAGNOSIS — K2990 Gastroduodenitis, unspecified, without bleeding: Secondary | ICD-10-CM | POA: Diagnosis not present

## 2014-03-28 DIAGNOSIS — D123 Benign neoplasm of transverse colon: Secondary | ICD-10-CM | POA: Diagnosis not present

## 2014-03-28 DIAGNOSIS — E119 Type 2 diabetes mellitus without complications: Secondary | ICD-10-CM | POA: Diagnosis not present

## 2014-03-28 DIAGNOSIS — D124 Benign neoplasm of descending colon: Secondary | ICD-10-CM | POA: Diagnosis not present

## 2014-03-28 DIAGNOSIS — I10 Essential (primary) hypertension: Secondary | ICD-10-CM | POA: Diagnosis not present

## 2014-03-28 DIAGNOSIS — Z8673 Personal history of transient ischemic attack (TIA), and cerebral infarction without residual deficits: Secondary | ICD-10-CM | POA: Diagnosis not present

## 2014-03-28 DIAGNOSIS — K219 Gastro-esophageal reflux disease without esophagitis: Secondary | ICD-10-CM | POA: Diagnosis not present

## 2014-03-28 MED ORDER — SODIUM CHLORIDE 0.9 % IV SOLN: 500.0000 mL | INTRAVENOUS | Status: DC

## 2014-03-28 NOTE — Op Note (Signed)
[place]  [address]. [place], [address]   ENDOSCOPY PROCEDURE REPORT  PATIENT: Stephen, [name]  MR#: [id number] BIRTHDATE: 09/03/1935 , 79  yrs. old GENDER: male ENDOSCOPIST: Eustace Quail, MD REFERRED BY:  .  Self / Office PROCEDURE DATE:  03/28/2014 PROCEDURE:  EGD w/ biopsy ASA CLASS:     Class II INDICATIONS:  dyspepsia.  Marland Kitchen History of incompletely treated H. pylori remotely MEDICATIONS: Monitored anesthesia care and Propofol 100 mg IV TOPICAL ANESTHETIC: none  DESCRIPTION OF PROCEDURE: After the risks benefits and alternatives of the procedure were thoroughly explained, informed consent was obtained.  The LB [id number] endoscope was introduced through the mouth and advanced to the second portion of the duodenum , Without limitations.  The instrument was slowly withdrawn as the mucosa was fully examined.   EXAM: The esophagus was normal.  The stomach revealed multiple superficial antral erosions.  The duodenum was normal.  CLO biopsy taken.  Retroflexed views revealed no abnormalities.     The scope was then withdrawn from the patient and the procedure completed.  COMPLICATIONS: There were no immediate complications.  ENDOSCOPIC IMPRESSION: 1. Mild antral gastritis status post CLO biopsy 2. Otherwise normal EGD  RECOMMENDATIONS: 1.  Recommend Prilosec OTC 20 mg daily for 2 weeks 2.  Rx CLO if positive  REPEAT EXAM:  eSigned:  Eustace Quail, MD 03/28/2014 3:37 PM    QR:FXJO, Edwyna Ready MD and The Patient

## 2014-03-28 NOTE — Progress Notes (Signed)
To recovery. Report to [facility], RN, VSS

## 2014-03-28 NOTE — Op Note (Signed)
[place]  [address]. [place], [address]   COLONOSCOPY PROCEDURE REPORT  PATIENT: Stephen, [name]  MR#: [id number] BIRTHDATE: 1935/09/13 , 79  yrs. old GENDER: male ENDOSCOPIST: Eustace Quail, MD REFERRED QB:[id number] Program Recall PROCEDURE DATE:  03/28/2014 PROCEDURE:   Colonoscopy with snare polypectomy x 6 First Screening Colonoscopy - Avg.  risk and is 50 yrs.  old or older - No.  Prior Negative Screening - Now for repeat screening. N/A  History of Adenoma - Now for follow-up colonoscopy & has been > or = to 3 yrs.  Yes hx of adenoma.  Has been 3 or more years since last colonoscopy.  Polyps Removed Today? Yes. ASA CLASS:   Class II INDICATIONS:patient's immediate family history of colon cancer (brother) and follow up of adenomatous colonic polyp(s).. Multiple and advanced adenomas. Previous examinations 2009 (index); follow-up exams 2009, 2010, 2011, 2013 MEDICATIONS: Monitored anesthesia care and Propofol 230 mg IV  DESCRIPTION OF PROCEDURE:   After the risks benefits and alternatives of the procedure were thoroughly explained, informed consent was obtained.  The digital rectal exam revealed no abnormalities of the rectum.   The LB [id number]  endoscope was introduced through the anus and advanced to the cecum, which was identified by both the appendix and ileocecal valve. No adverse events experienced.   The quality of the prep was excellent, using MoviPrep  The instrument was then slowly withdrawn as the colon was fully examined.  COLON FINDINGS: Six polyps ranging between 3-49mm in size were found in the descending colon (2) and transverse colon (4).  A polypectomy was performed with a cold snare.  The resection was complete, the polyp tissue was completely retrieved and sent to histology.   There was moderate diverticulosis noted in the sigmoid colon.   The examination was otherwise normal.  Retroflexed views revealed internal  hemorrhoids. The time to cecum=3 minutes 50 seconds.  Withdrawal time=18 minutes 57 seconds.  The scope was withdrawn and the procedure completed. COMPLICATIONS: There were no immediate complications.  ENDOSCOPIC IMPRESSION: 1.   Six polyps  were found in the descending colon and transverse colon; polypectomy was performed with a cold snare 2.   Moderate diverticulosis was noted in the sigmoid colon 3.   The examination was otherwise normal  RECOMMENDATIONS: 1. Repeat Colonoscopy in 3 years. 2. EGD today. Please see report  eSigned:  Eustace Quail, MD 03/28/2014 3:31 PM   cc: Wende Neighbors MD and The Patient

## 2014-03-28 NOTE — Patient Instructions (Signed)
Discharge instructions given. Handouts on polyps,diverticulosis and gastritis. Resume previous medications. YOU HAD AN ENDOSCOPIC PROCEDURE TODAY AT [facility]:   Refer to the procedure report that was given to you for any specific questions about what was found during the examination.  If the procedure report does not answer your questions, please call your gastroenterologist to clarify.  If you requested that your care partner not be given the details of your procedure findings, then the procedure report has been included in a sealed envelope for you to review at your convenience later.  YOU SHOULD EXPECT: Some feelings of bloating in the abdomen. Passage of more gas than usual.  Walking can help get rid of the air that was put into your GI tract during the procedure and reduce the bloating. If you had a lower endoscopy (such as a colonoscopy or flexible sigmoidoscopy) you may notice spotting of blood in your stool or on the toilet paper. If you underwent a bowel prep for your procedure, you may not have a normal bowel movement for a few days.  Please Note:  You might notice some irritation and congestion in your nose or some drainage.  This is from the oxygen used during your procedure.  There is no need for concern and it should clear up in a day or so.  SYMPTOMS TO REPORT IMMEDIATELY:   Following lower endoscopy (colonoscopy or flexible sigmoidoscopy):  Excessive amounts of blood in the stool  Significant tenderness or worsening of abdominal pains  Swelling of the abdomen that is new, acute  Fever of 100F or higher   Following upper endoscopy (EGD)  Vomiting of blood or coffee ground material  New chest pain or pain under the shoulder blades  Painful or persistently difficult swallowing  New shortness of breath  Fever of 100F or higher  Black, tarry-looking stools  For urgent or emergent issues, a gastroenterologist can be reached at any hour by calling [phone].   DIET: Your first meal following the procedure should be a small meal and then it is ok to progress to your normal diet. Heavy or fried foods are harder to digest and may make you feel nauseous or bloated.  Likewise, meals heavy in dairy and vegetables can increase bloating.  Drink plenty of fluids but you should avoid alcoholic beverages for 24 hours.  ACTIVITY:  You should plan to take it easy for the rest of today and you should NOT DRIVE or use heavy machinery until tomorrow (because of the sedation medicines used during the test).    FOLLOW UP: Our staff will call the number listed on your records the next business day following your procedure to check on you and address any questions or concerns that you may have regarding the information given to you following your procedure. If we do not reach you, we will leave a message.  However, if you are feeling well and you are not experiencing any problems, there is no need to return our call.  We will assume that you have returned to your regular daily activities without incident.  If any biopsies were taken you will be contacted by phone or by letter within the next 1-3 weeks.  Please call us at [phone] if you have not heard about the biopsies in 3 weeks.    SIGNATURES/CONFIDENTIALITY: You and/or your care partner have signed paperwork which will be entered into your electronic medical record.  These signatures attest to the fact that that  the information above on your After Visit Summary has been reviewed and is understood.  Full responsibility of the confidentiality of this discharge information lies with you and/or your care-partner.

## 2014-03-28 NOTE — Progress Notes (Signed)
Called to room to assist during endoscopic procedure.  Patient ID and intended procedure confirmed with present staff. Received instructions for my participation in the procedure from the performing physician.  

## 2014-03-31 ENCOUNTER — Telehealth: Payer: Self-pay

## 2014-03-31 ENCOUNTER — Encounter: Payer: Medicare Other | Admitting: Internal Medicine

## 2014-03-31 NOTE — Telephone Encounter (Signed)
  Follow up Call-  Call back number 03/28/2014  Post procedure Call Back phone  # [phone]  Permission to leave phone message Yes     Patient questions:  Do you have a fever, pain , or abdominal swelling? No. Pain Score  0 *  Have you tolerated food without any problems? Yes.    Have you been able to return to your normal activities? Yes.    Do you have any questions about your discharge instructions: Diet   No. Medications  No. Follow up visit  No.  Do you have questions or concerns about your Care? No.  Actions: * If pain score is 4 or above: No action needed, pain <4.

## 2014-04-02 LAB — HELICOBACTER PYLORI SCREEN-BIOPSY: UREASE: NEGATIVE

## 2014-04-03 ENCOUNTER — Encounter: Payer: Self-pay | Admitting: Internal Medicine

## 2014-04-07 ENCOUNTER — Telehealth: Payer: Self-pay | Admitting: Internal Medicine

## 2014-04-07 NOTE — Telephone Encounter (Signed)
That would be very unusual. In any event, can change to pantoprazole equivalent dosage daily.

## 2014-04-07 NOTE — Telephone Encounter (Signed)
Discussed with pt and offered to call in different medication. Pt states he only has 6 more pills left and he will try to finish those, did not want new med called in for him.

## 2014-04-07 NOTE — Telephone Encounter (Signed)
Pt states he started taking prilosec recently and that he has started having joint pain in his elbows and his hands. States he has had some swelling in his hands also. Pt thinks this is due to the prilosec. Please advise.

## 2014-04-09 ENCOUNTER — Telehealth: Payer: Self-pay | Admitting: Internal Medicine

## 2014-04-09 DIAGNOSIS — N200 Calculus of kidney: Secondary | ICD-10-CM | POA: Diagnosis not present

## 2014-04-09 DIAGNOSIS — M545 Low back pain: Secondary | ICD-10-CM | POA: Diagnosis not present

## 2014-04-09 NOTE — Telephone Encounter (Signed)
Pt aware and has OV with urology today.

## 2014-04-09 NOTE — Telephone Encounter (Signed)
Pt called on Monday and stated he was having joint pain and aches, thought this was from his prilosec. Dr. Henrene Pastor felt this was not likely but stated to change pt to pantoprazole. Pt did not want to change and get another prescription, stated he would just finish what he had. Pt calls today and states he usually has to get up during the night to urinate but last night he didn't. States he drank a glass of water and still has not urinated much at all. Pt also states his lower back has some discomfort. Pt looked online and saw where omeprazole can cause interstitial nephritis and he is concerned about this. Pt is going to call his urologist but wanted to let us know. Instructed pt to stop the omeprazole. Pt was supposed to take prilosec for 2 weeks following EGD done on 03/28/14. Does pt need to be placed on another PPI now? Please advise.

## 2014-04-09 NOTE — Telephone Encounter (Signed)
His symptoms are NOT related to PPI, but he can stop it without a different PPI needed. See the urologist as planned

## 2014-04-21 ENCOUNTER — Telehealth: Payer: Self-pay | Admitting: Internal Medicine

## 2014-04-21 NOTE — Telephone Encounter (Signed)
Spoke with pt and reviewed h pylori results with pt which were negative, pt aware.

## 2014-05-23 DIAGNOSIS — H4011X2 Primary open-angle glaucoma, moderate stage: Secondary | ICD-10-CM | POA: Diagnosis not present

## 2014-06-17 DIAGNOSIS — Z125 Encounter for screening for malignant neoplasm of prostate: Secondary | ICD-10-CM | POA: Diagnosis not present

## 2014-06-17 DIAGNOSIS — I10 Essential (primary) hypertension: Secondary | ICD-10-CM | POA: Diagnosis not present

## 2014-06-17 DIAGNOSIS — E119 Type 2 diabetes mellitus without complications: Secondary | ICD-10-CM | POA: Diagnosis not present

## 2014-06-19 DIAGNOSIS — E119 Type 2 diabetes mellitus without complications: Secondary | ICD-10-CM | POA: Diagnosis not present

## 2014-06-19 DIAGNOSIS — J069 Acute upper respiratory infection, unspecified: Secondary | ICD-10-CM | POA: Diagnosis not present

## 2014-06-19 DIAGNOSIS — H1089 Other conjunctivitis: Secondary | ICD-10-CM | POA: Diagnosis not present

## 2014-06-19 DIAGNOSIS — I10 Essential (primary) hypertension: Secondary | ICD-10-CM | POA: Diagnosis not present

## 2014-06-25 ENCOUNTER — Other Ambulatory Visit: Payer: Self-pay | Admitting: Urology

## 2014-06-25 ENCOUNTER — Ambulatory Visit (HOSPITAL_COMMUNITY)
Admission: RE | Admit: 2014-06-25 | Discharge: 2014-06-25 | Disposition: A | Discharge status: Home / Self Care | Payer: Medicare Other | Source: Ambulatory Visit | Attending: Urology | Admitting: Urology

## 2014-06-25 DIAGNOSIS — N200 Calculus of kidney: Secondary | ICD-10-CM | POA: Insufficient documentation

## 2014-06-25 NOTE — Telephone Encounter (Signed)
Done

## 2014-06-27 ENCOUNTER — Ambulatory Visit (INDEPENDENT_AMBULATORY_CARE_PROVIDER_SITE_OTHER): Payer: Medicare Other | Admitting: Urology

## 2014-06-27 ENCOUNTER — Ambulatory Visit: Payer: Medicare Other | Admitting: Urology

## 2014-06-27 DIAGNOSIS — N200 Calculus of kidney: Secondary | ICD-10-CM | POA: Diagnosis not present

## 2014-06-27 DIAGNOSIS — R972 Elevated prostate specific antigen [PSA]: Secondary | ICD-10-CM

## 2014-06-27 DIAGNOSIS — N390 Urinary tract infection, site not specified: Secondary | ICD-10-CM | POA: Diagnosis not present

## 2014-07-11 ENCOUNTER — Ambulatory Visit: Payer: Medicare Other | Admitting: Urology

## 2014-08-05 DIAGNOSIS — R972 Elevated prostate specific antigen [PSA]: Secondary | ICD-10-CM | POA: Diagnosis not present

## 2014-09-04 IMAGING — CR DG CHEST 1V PORT
1 series · 1 of 1 positions shown · non-contrast
Comparison: Prior study from 06/05/2012

CLINICAL DATA: Chest pain

EXAM:
PORTABLE CHEST - 1 VIEW

[AP]
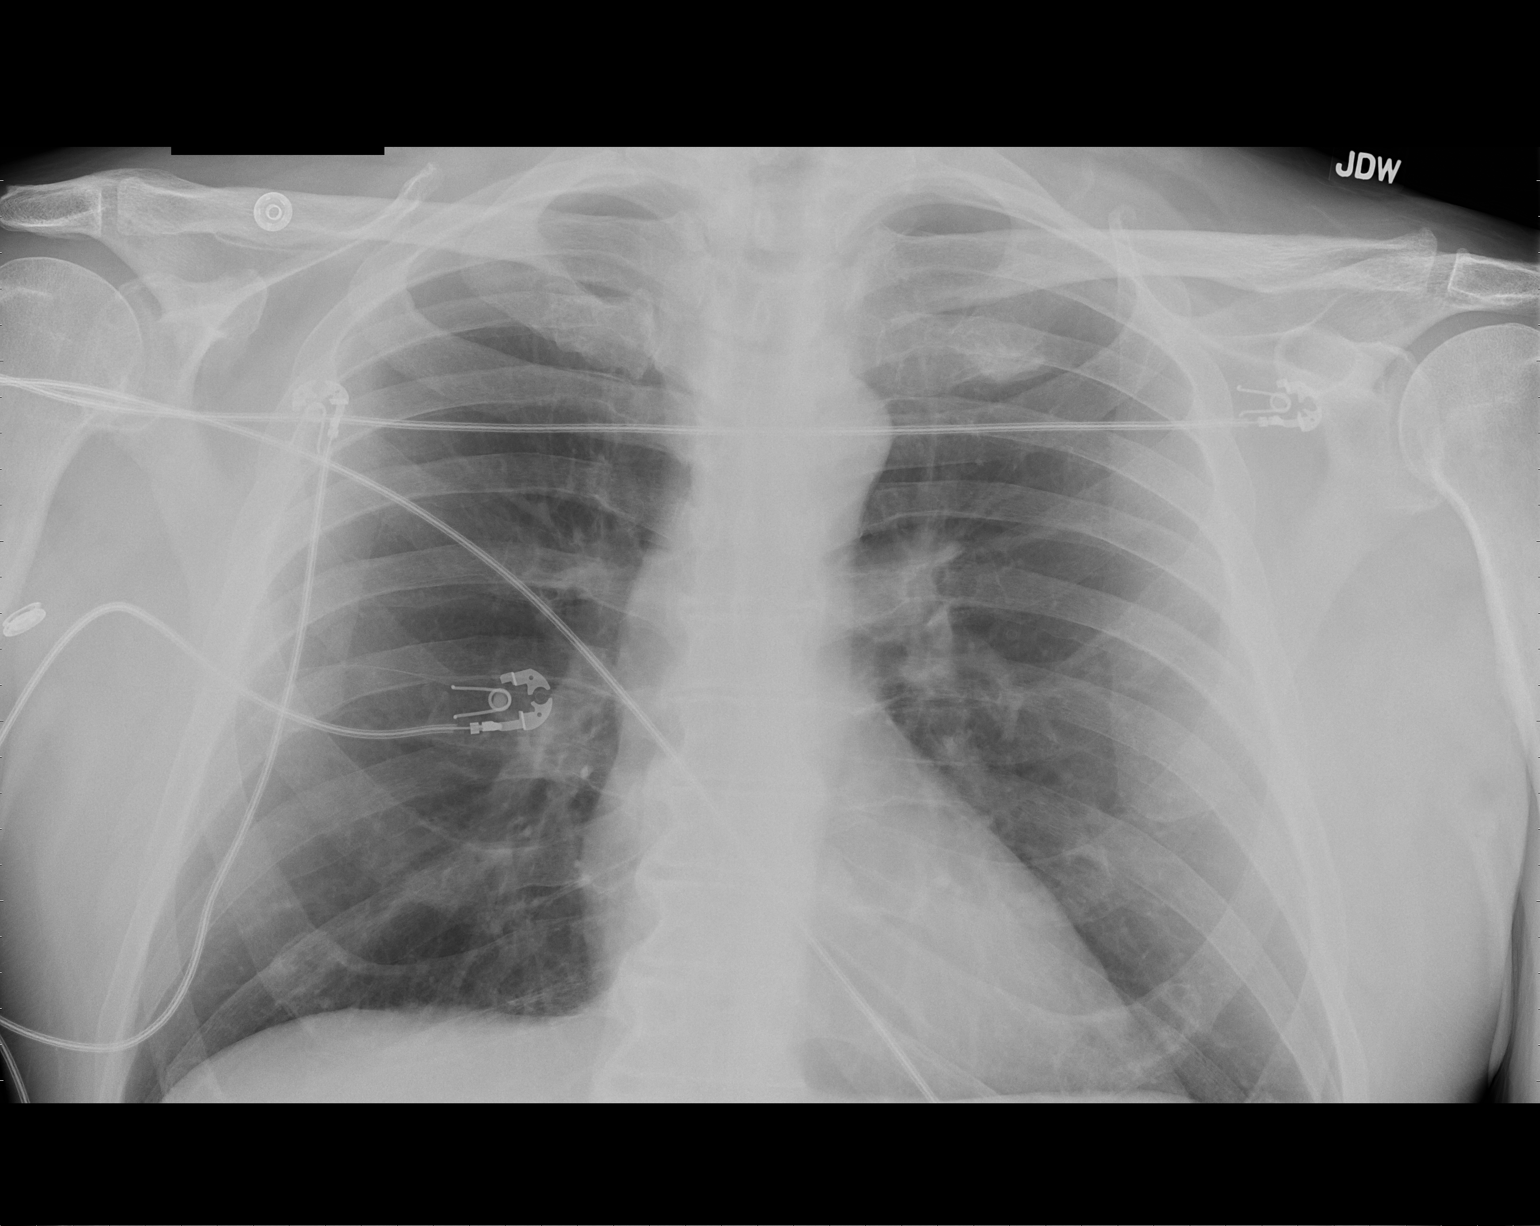

[1 of 1 positions shown; findings below may reference images not displayed]

FINDINGS: Cardiac and mediastinal silhouettes are stable in size and contour,
and remain within normal limits.

Lungs are normally inflated. No focal infiltrate, pulmonary edema,
or pleural effusion is identified. There is no pneumothorax.

Osseous structures are within normal limits.
IMPRESSION: No radiographic evidence of acute cardiopulmonary disease.

## 2014-10-16 DIAGNOSIS — E119 Type 2 diabetes mellitus without complications: Secondary | ICD-10-CM | POA: Diagnosis not present

## 2014-10-21 DIAGNOSIS — E119 Type 2 diabetes mellitus without complications: Secondary | ICD-10-CM | POA: Diagnosis not present

## 2014-10-21 DIAGNOSIS — E782 Mixed hyperlipidemia: Secondary | ICD-10-CM | POA: Diagnosis not present

## 2014-10-21 DIAGNOSIS — I10 Essential (primary) hypertension: Secondary | ICD-10-CM | POA: Diagnosis not present

## 2014-10-21 DIAGNOSIS — M546 Pain in thoracic spine: Secondary | ICD-10-CM | POA: Diagnosis not present

## 2014-10-31 ENCOUNTER — Ambulatory Visit (INDEPENDENT_AMBULATORY_CARE_PROVIDER_SITE_OTHER): Payer: Medicare Other | Admitting: Urology

## 2014-10-31 DIAGNOSIS — R972 Elevated prostate specific antigen [PSA]: Secondary | ICD-10-CM | POA: Diagnosis not present

## 2014-10-31 DIAGNOSIS — N200 Calculus of kidney: Secondary | ICD-10-CM | POA: Diagnosis not present

## 2014-10-31 DIAGNOSIS — R350 Frequency of micturition: Secondary | ICD-10-CM | POA: Diagnosis not present

## 2014-11-25 DIAGNOSIS — H401132 Primary open-angle glaucoma, bilateral, moderate stage: Secondary | ICD-10-CM | POA: Diagnosis not present

## 2015-02-10 DIAGNOSIS — D225 Melanocytic nevi of trunk: Secondary | ICD-10-CM | POA: Diagnosis not present

## 2015-02-10 DIAGNOSIS — L812 Freckles: Secondary | ICD-10-CM | POA: Diagnosis not present

## 2015-02-10 DIAGNOSIS — L821 Other seborrheic keratosis: Secondary | ICD-10-CM | POA: Diagnosis not present

## 2015-02-11 DIAGNOSIS — H401132 Primary open-angle glaucoma, bilateral, moderate stage: Secondary | ICD-10-CM | POA: Diagnosis not present

## 2015-04-06 ENCOUNTER — Other Ambulatory Visit: Payer: Self-pay | Admitting: Urology

## 2015-04-06 ENCOUNTER — Ambulatory Visit (HOSPITAL_COMMUNITY)
Admission: RE | Admit: 2015-04-06 | Discharge: 2015-04-06 | Disposition: A | Discharge status: Home / Self Care | Payer: Medicare Other | Source: Ambulatory Visit | Attending: Urology | Admitting: Urology

## 2015-04-06 DIAGNOSIS — N200 Calculus of kidney: Secondary | ICD-10-CM | POA: Diagnosis not present

## 2015-04-06 DIAGNOSIS — R972 Elevated prostate specific antigen [PSA]: Secondary | ICD-10-CM | POA: Diagnosis not present

## 2015-04-10 ENCOUNTER — Ambulatory Visit (INDEPENDENT_AMBULATORY_CARE_PROVIDER_SITE_OTHER): Payer: Medicare Other | Admitting: Urology

## 2015-04-10 DIAGNOSIS — N200 Calculus of kidney: Secondary | ICD-10-CM

## 2015-04-10 DIAGNOSIS — R972 Elevated prostate specific antigen [PSA]: Secondary | ICD-10-CM

## 2015-04-13 DIAGNOSIS — J069 Acute upper respiratory infection, unspecified: Secondary | ICD-10-CM | POA: Diagnosis not present

## 2015-04-13 DIAGNOSIS — R05 Cough: Secondary | ICD-10-CM | POA: Diagnosis not present

## 2015-04-16 DIAGNOSIS — E119 Type 2 diabetes mellitus without complications: Secondary | ICD-10-CM | POA: Diagnosis not present

## 2015-04-21 DIAGNOSIS — J069 Acute upper respiratory infection, unspecified: Secondary | ICD-10-CM | POA: Diagnosis not present

## 2015-04-21 DIAGNOSIS — I10 Essential (primary) hypertension: Secondary | ICD-10-CM | POA: Diagnosis not present

## 2015-04-21 DIAGNOSIS — H1089 Other conjunctivitis: Secondary | ICD-10-CM | POA: Diagnosis not present

## 2015-04-21 DIAGNOSIS — E119 Type 2 diabetes mellitus without complications: Secondary | ICD-10-CM | POA: Diagnosis not present

## 2015-05-19 DIAGNOSIS — H401132 Primary open-angle glaucoma, bilateral, moderate stage: Secondary | ICD-10-CM | POA: Diagnosis not present

## 2015-05-19 DIAGNOSIS — E119 Type 2 diabetes mellitus without complications: Secondary | ICD-10-CM | POA: Diagnosis not present

## 2015-09-04 DIAGNOSIS — H401112 Primary open-angle glaucoma, right eye, moderate stage: Secondary | ICD-10-CM | POA: Diagnosis not present

## 2015-10-15 DIAGNOSIS — E119 Type 2 diabetes mellitus without complications: Secondary | ICD-10-CM | POA: Diagnosis not present

## 2015-10-15 DIAGNOSIS — I10 Essential (primary) hypertension: Secondary | ICD-10-CM | POA: Diagnosis not present

## 2015-10-15 DIAGNOSIS — Z125 Encounter for screening for malignant neoplasm of prostate: Secondary | ICD-10-CM | POA: Diagnosis not present

## 2015-10-20 DIAGNOSIS — E119 Type 2 diabetes mellitus without complications: Secondary | ICD-10-CM | POA: Diagnosis not present

## 2015-10-20 DIAGNOSIS — J3089 Other allergic rhinitis: Secondary | ICD-10-CM | POA: Diagnosis not present

## 2015-10-20 DIAGNOSIS — E784 Other hyperlipidemia: Secondary | ICD-10-CM | POA: Diagnosis not present

## 2015-10-20 DIAGNOSIS — R972 Elevated prostate specific antigen [PSA]: Secondary | ICD-10-CM | POA: Diagnosis not present

## 2015-10-20 DIAGNOSIS — I10 Essential (primary) hypertension: Secondary | ICD-10-CM | POA: Diagnosis not present

## 2015-10-20 DIAGNOSIS — R944 Abnormal results of kidney function studies: Secondary | ICD-10-CM | POA: Diagnosis not present

## 2015-11-03 DIAGNOSIS — R944 Abnormal results of kidney function studies: Secondary | ICD-10-CM | POA: Diagnosis not present

## 2015-11-04 DIAGNOSIS — H401112 Primary open-angle glaucoma, right eye, moderate stage: Secondary | ICD-10-CM | POA: Diagnosis not present

## 2016-02-12 DIAGNOSIS — D1801 Hemangioma of skin and subcutaneous tissue: Secondary | ICD-10-CM | POA: Diagnosis not present

## 2016-02-12 DIAGNOSIS — L570 Actinic keratosis: Secondary | ICD-10-CM | POA: Diagnosis not present

## 2016-02-12 DIAGNOSIS — L821 Other seborrheic keratosis: Secondary | ICD-10-CM | POA: Diagnosis not present

## 2016-02-12 DIAGNOSIS — D2272 Melanocytic nevi of left lower limb, including hip: Secondary | ICD-10-CM | POA: Diagnosis not present

## 2016-02-12 DIAGNOSIS — L812 Freckles: Secondary | ICD-10-CM | POA: Diagnosis not present

## 2016-02-12 DIAGNOSIS — D2271 Melanocytic nevi of right lower limb, including hip: Secondary | ICD-10-CM | POA: Diagnosis not present

## 2016-02-12 DIAGNOSIS — D225 Melanocytic nevi of trunk: Secondary | ICD-10-CM | POA: Diagnosis not present

## 2016-03-27 IMAGING — DX DG ABDOMEN 1V
1 series · 1 of 1 positions shown · non-contrast
Comparison: 06/05/2013

CLINICAL DATA: Kidneys stone 3 years ago with lithotripsy, no
current pain

EXAM:
ABDOMEN - 1 VIEW

[abdomen supine]
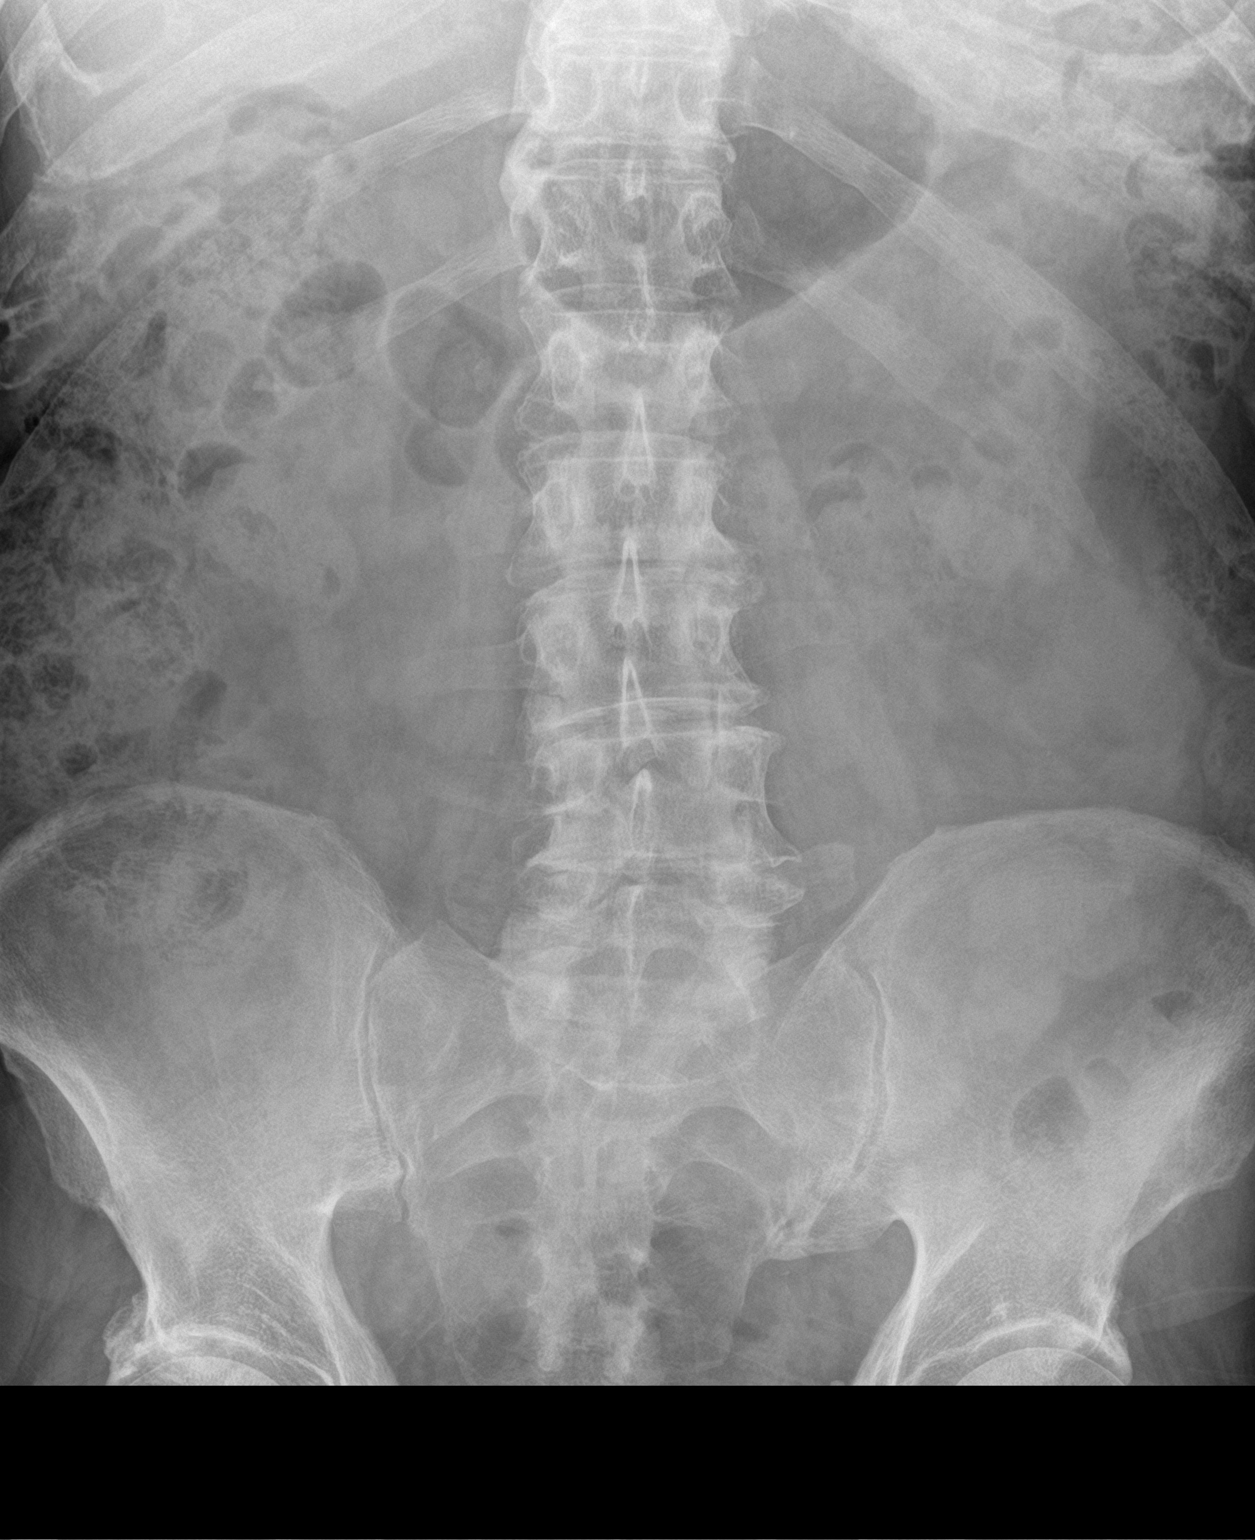

[1 of 1 positions shown; findings below may reference images not displayed]

FINDINGS: Triangular shaped RIGHT renal calculus 7 mm greatest diameter
unchanged.

No additional urinary tract calcifications are seen, though stool
artifact does project over RIGHT kidney.

Small LEFT pelvic phlebolith.

Bones demineralized.

Bowel gas pattern normal.
IMPRESSION: 7 mm RIGHT renal calculus unchanged.

## 2016-04-12 ENCOUNTER — Ambulatory Visit (HOSPITAL_COMMUNITY)
Admission: RE | Admit: 2016-04-12 | Discharge: 2016-04-12 | Disposition: A | Discharge status: Home / Self Care | Payer: Medicare Other | Source: Ambulatory Visit | Attending: Urology | Admitting: Urology

## 2016-04-12 ENCOUNTER — Other Ambulatory Visit: Payer: Self-pay | Admitting: Urology

## 2016-04-12 DIAGNOSIS — N200 Calculus of kidney: Secondary | ICD-10-CM | POA: Insufficient documentation

## 2016-04-12 DIAGNOSIS — R935 Abnormal findings on diagnostic imaging of other abdominal regions, including retroperitoneum: Secondary | ICD-10-CM | POA: Diagnosis not present

## 2016-04-13 DIAGNOSIS — I10 Essential (primary) hypertension: Secondary | ICD-10-CM | POA: Diagnosis not present

## 2016-04-13 DIAGNOSIS — E782 Mixed hyperlipidemia: Secondary | ICD-10-CM | POA: Diagnosis not present

## 2016-04-13 DIAGNOSIS — R972 Elevated prostate specific antigen [PSA]: Secondary | ICD-10-CM | POA: Diagnosis not present

## 2016-04-13 DIAGNOSIS — E119 Type 2 diabetes mellitus without complications: Secondary | ICD-10-CM | POA: Diagnosis not present

## 2016-04-15 ENCOUNTER — Ambulatory Visit (INDEPENDENT_AMBULATORY_CARE_PROVIDER_SITE_OTHER): Payer: Medicare Other | Admitting: Urology

## 2016-04-15 DIAGNOSIS — N200 Calculus of kidney: Secondary | ICD-10-CM | POA: Diagnosis not present

## 2016-04-15 DIAGNOSIS — R972 Elevated prostate specific antigen [PSA]: Secondary | ICD-10-CM

## 2016-04-18 DIAGNOSIS — R944 Abnormal results of kidney function studies: Secondary | ICD-10-CM | POA: Diagnosis not present

## 2016-04-18 DIAGNOSIS — E119 Type 2 diabetes mellitus without complications: Secondary | ICD-10-CM | POA: Diagnosis not present

## 2016-04-18 DIAGNOSIS — E782 Mixed hyperlipidemia: Secondary | ICD-10-CM | POA: Diagnosis not present

## 2016-04-18 DIAGNOSIS — Z6826 Body mass index (BMI) 26.0-26.9, adult: Secondary | ICD-10-CM | POA: Diagnosis not present

## 2016-04-18 DIAGNOSIS — I10 Essential (primary) hypertension: Secondary | ICD-10-CM | POA: Diagnosis not present

## 2016-04-18 DIAGNOSIS — J309 Allergic rhinitis, unspecified: Secondary | ICD-10-CM | POA: Diagnosis not present

## 2016-06-08 DIAGNOSIS — H401121 Primary open-angle glaucoma, left eye, mild stage: Secondary | ICD-10-CM | POA: Diagnosis not present

## 2016-06-08 DIAGNOSIS — H401113 Primary open-angle glaucoma, right eye, severe stage: Secondary | ICD-10-CM | POA: Diagnosis not present

## 2016-06-08 DIAGNOSIS — R7303 Prediabetes: Secondary | ICD-10-CM | POA: Diagnosis not present

## 2016-09-13 DIAGNOSIS — H401121 Primary open-angle glaucoma, left eye, mild stage: Secondary | ICD-10-CM | POA: Diagnosis not present

## 2016-09-13 DIAGNOSIS — H401113 Primary open-angle glaucoma, right eye, severe stage: Secondary | ICD-10-CM | POA: Diagnosis not present

## 2016-10-17 DIAGNOSIS — I10 Essential (primary) hypertension: Secondary | ICD-10-CM | POA: Diagnosis not present

## 2016-10-17 DIAGNOSIS — E119 Type 2 diabetes mellitus without complications: Secondary | ICD-10-CM | POA: Diagnosis not present

## 2016-10-17 DIAGNOSIS — R972 Elevated prostate specific antigen [PSA]: Secondary | ICD-10-CM | POA: Diagnosis not present

## 2016-10-19 DIAGNOSIS — R946 Abnormal results of thyroid function studies: Secondary | ICD-10-CM | POA: Diagnosis not present

## 2016-10-19 DIAGNOSIS — E784 Other hyperlipidemia: Secondary | ICD-10-CM | POA: Diagnosis not present

## 2016-10-19 DIAGNOSIS — E119 Type 2 diabetes mellitus without complications: Secondary | ICD-10-CM | POA: Diagnosis not present

## 2016-10-19 DIAGNOSIS — J3089 Other allergic rhinitis: Secondary | ICD-10-CM | POA: Diagnosis not present

## 2016-10-19 DIAGNOSIS — I10 Essential (primary) hypertension: Secondary | ICD-10-CM | POA: Diagnosis not present

## 2016-10-19 DIAGNOSIS — Z6826 Body mass index (BMI) 26.0-26.9, adult: Secondary | ICD-10-CM | POA: Diagnosis not present

## 2016-10-19 DIAGNOSIS — R944 Abnormal results of kidney function studies: Secondary | ICD-10-CM | POA: Diagnosis not present

## 2016-12-30 DIAGNOSIS — H401113 Primary open-angle glaucoma, right eye, severe stage: Secondary | ICD-10-CM | POA: Diagnosis not present

## 2016-12-30 DIAGNOSIS — H401122 Primary open-angle glaucoma, left eye, moderate stage: Secondary | ICD-10-CM | POA: Diagnosis not present

## 2017-01-19 ENCOUNTER — Other Ambulatory Visit: Payer: Self-pay

## 2017-01-19 ENCOUNTER — Encounter (HOSPITAL_COMMUNITY): Payer: Self-pay

## 2017-01-19 ENCOUNTER — Emergency Department (HOSPITAL_COMMUNITY)
Admission: EM | Admit: 2017-01-19 | Discharge: 2017-01-19 | Disposition: A | Discharge status: Home / Self Care | Payer: Medicare Other | Attending: Emergency Medicine | Admitting: Emergency Medicine

## 2017-01-19 DIAGNOSIS — Z79899 Other long term (current) drug therapy: Secondary | ICD-10-CM | POA: Diagnosis not present

## 2017-01-19 DIAGNOSIS — S76311A Strain of muscle, fascia and tendon of the posterior muscle group at thigh level, right thigh, initial encounter: Secondary | ICD-10-CM

## 2017-01-19 DIAGNOSIS — Y939 Activity, unspecified: Secondary | ICD-10-CM | POA: Insufficient documentation

## 2017-01-19 DIAGNOSIS — S76911A Strain of unspecified muscles, fascia and tendons at thigh level, right thigh, initial encounter: Secondary | ICD-10-CM | POA: Diagnosis not present

## 2017-01-19 DIAGNOSIS — W108XXA Fall (on) (from) other stairs and steps, initial encounter: Secondary | ICD-10-CM | POA: Insufficient documentation

## 2017-01-19 DIAGNOSIS — Y929 Unspecified place or not applicable: Secondary | ICD-10-CM | POA: Diagnosis not present

## 2017-01-19 DIAGNOSIS — Z7982 Long term (current) use of aspirin: Secondary | ICD-10-CM | POA: Diagnosis not present

## 2017-01-19 DIAGNOSIS — I129 Hypertensive chronic kidney disease with stage 1 through stage 4 chronic kidney disease, or unspecified chronic kidney disease: Secondary | ICD-10-CM | POA: Insufficient documentation

## 2017-01-19 DIAGNOSIS — Y999 Unspecified external cause status: Secondary | ICD-10-CM | POA: Diagnosis not present

## 2017-01-19 DIAGNOSIS — N189 Chronic kidney disease, unspecified: Secondary | ICD-10-CM | POA: Diagnosis not present

## 2017-01-19 DIAGNOSIS — E1122 Type 2 diabetes mellitus with diabetic chronic kidney disease: Secondary | ICD-10-CM | POA: Insufficient documentation

## 2017-01-19 DIAGNOSIS — S79821A Other specified injuries of right thigh, initial encounter: Secondary | ICD-10-CM | POA: Diagnosis present

## 2017-01-19 MED ORDER — DICLOFENAC SODIUM 1 % TD GEL: 4.0000 g | Freq: Four times a day (QID) | TRANSDERMAL | 1 refills | Status: DC

## 2017-01-19 NOTE — ED Triage Notes (Addendum)
Patient reports of missing last step on landing in house, attempted to catch himself with railing. Now having pain in right upper leg-posterior. Denies neck or back pain.

## 2017-01-19 NOTE — ED Provider Notes (Signed)
[facility] EMERGENCY DEPARTMENT Provider Note   CSN: [id number] Arrival date & time: 01/19/17  1624     History   Chief Complaint Chief Complaint  Patient presents with  . Leg Pain    HPI Stephen [name] is a 81 y.o. male.  HPI  The patient is a 81 year old male, he presents to the hospital after suffering acute onset of right posterior thigh pain in the proximal hamstring when he slipped going down the stairs and stumbled backwards falling into the wall with his buttocks.  He left a large hole in the sheet rock with his buttocks, he did not fall to the ground, did not hit his head, has no chest pain shortness of breath upper extremity pain or lower extremity pain except for the posterior right hamstring.  This is worse with bending over at the waist, worse with stretching his legs, he is pain-free in certain positions.  He has had no medications prior to arrival.  The injury occurred 1 hour ago.  Past Medical History:  Diagnosis Date  . Adenomatous colon polyp   . Allergy   . Arthritis   . BPH (benign prostatic hypertrophy)   . Cataract    bil cataracts removed  . Chest pain   . CKD (chronic kidney disease)   . Diabetes mellitus without complication ([place])    controlled with diet  . Diverticulosis   . GERD (gastroesophageal reflux disease)   . Glaucoma   . Helicobacter pylori (H. pylori)   . Hemorrhoids   . Hyperlipidemia   . Hypertension   . Kidney stones   . TIA (transient ischemic attack)    "side affect from medication had symptoms of TIA"  . UTI (lower urinary tract infection)     Patient Active Problem List   Diagnosis Date Noted  . Carotid artery plaque 07/31/2013  . Amaurosis fugax, left eye 07/02/2013  . TIA (transient ischemic attack) 07/02/2013  . Visual changes 07/02/2013  . HYPERLIPIDEMIA 02/13/2009  . HYPERTENSION, BENIGN 02/13/2009  . TRANSIENT ISCHEMIC ATTACK 02/13/2009  . NEPHROLITHIASIS 02/13/2009  . CHEST PAIN 02/13/2009  . BENIGN  PROSTATIC HYPERTROPHY, HX OF 02/13/2009  . ADENOMATOUS COLONIC POLYP 11/15/2007  . PERSONAL HX COLONIC POLYPS 11/15/2007    Past Surgical History:  Procedure Laterality Date  . CATARACT EXTRACTION Bilateral   . COLONOSCOPY    . CYSTOSCOPY WITH STENT PLACEMENT Left 05/01/2012   Procedure: CYSTOSCOPY WITH URETEROSCOPY AND STONE EXTRACTION, LEFT STENT PLACEMENT ;  Surgeon: Malka So, MD;  Location: [facility];  Service: Urology;  Laterality: Left;  STONE EXTRACTION  STENT PLACEMENT    . ESOPHAGOGASTRODUODENOSCOPY ENDOSCOPY    . LITHOTRIPSY         Home Medications    Prior to Admission medications   Medication Sig Start Date End Date Taking? Authorizing Provider  Alpha-Lipoic Acid 200 MG CAPS Take 1 capsule by mouth daily.   Yes [provider]  AMBULATORY NON FORMULARY MEDICATION Flaxseed Meal 1/4 cup 3-4 times/week   Yes [provider]  AMBULATORY NON FORMULARY MEDICATION Black Cherry 1000 mg Take 1 tablet by mouth twice daily   Yes [provider]  AMBULATORY NON FORMULARY MEDICATION Healthy Prostate  Take 1 tablet by mouth twice daily   Yes [provider]  amLODipine (NORVASC) 5 MG tablet Take 5 mg by mouth at bedtime.   Yes [provider]  Ascorbic Acid (VITAMIN C) 1000 MG tablet Take 1,000 mg by mouth 2 (two) times daily.  Yes [provider]  aspirin 325 MG tablet Take 1 tablet (325 mg total) by mouth daily. 07/02/13  Yes Kelvin Cellar, MD  Berberine Chloride POWD Take 500 mg by mouth 2 (two) times daily with a meal.   Yes [provider]  Cholecalciferol (VITAMIN D3) 5000 UNITS TABS Take 1 tablet by mouth daily.   Yes [provider]  Chromium Picolinate (V-R CHROMIUM PICOLINATE) 200 MCG TABS Take 1 tablet by mouth 2 (two) times daily.     Yes [provider]  Coenzyme Q10 (CO Q 10) 100 MG CAPS Take 1 capsule by mouth 2 (two) times daily.   Yes [provider]  Cyanocobalamin  (VITAMIN B12) 3000 MCG/ML LIQD Place 1 tablet under the tongue daily.   Yes [provider]  Docusate Sodium (DULCOLAX STOOL SOFTENER PO) Take 1 capsule by mouth 2 (two) times daily. Takes twice daily   Yes [provider]  Evening Primrose Oil 500 MG CAPS Take 1 capsule by mouth 2 (two) times daily.   Yes [provider]  Gymnema Sylvestris Leaf POWD Take 400 mg by mouth 2 (two) times daily with a meal. TABLET FORM   Yes [provider]  Lactobacillus-Inulin ([place] PO) Take 1 capsule by mouth as needed. When taking antibiotics   Yes [provider]  Magnesium 500 MG TABS Take 1 tablet by mouth 2 (two) times daily.     Yes [provider]  metoprolol (LOPRESSOR) 50 MG tablet Take 50 mg by mouth 2 (two) times daily. 06/28/13  Yes [provider]  Multiple Vitamin (MULTIVITAMIN WITH MINERALS) TABS tablet Take 1 tablet by mouth daily.   Yes [provider]  niacin (NIASPAN) 1000 MG CR tablet Take 1,000 mg by mouth at bedtime.    Yes [provider]  Omega 3-6-9 Fatty Acids (TRIPLE OMEGA-3-6-9) CAPS Take 1 capsule by mouth 2 (two) times daily.   Yes [provider]  Probiotic Product (PROBIOTIC FORMULA) CAPS Take 1 capsule by mouth every morning.    Yes [provider]  rosuvastatin (CRESTOR) 5 MG tablet Take 2.5 mg by mouth at bedtime.   Yes [provider]  timolol (BETIMOL) 0.5 % ophthalmic solution Place 1 drop into both eyes daily.   Yes [provider]  vitamin E (VITAMIN E) 400 UNIT capsule Take 400 Units by mouth 2 (two) times daily.     Yes [provider]  vitamin k 100 MCG tablet Take 150 mcg by mouth every morning.    Yes [provider]  Wheat Dextrin (BENEFIBER PO) Take 10 mLs by mouth 2 (two) times daily.   Yes [provider]  diclofenac sodium (VOLTAREN) 1 % GEL Apply 4 g topically 4 (four) times daily. 01/19/17   Noemi Chapel, MD    Family History Family History  Problem Relation Age of Onset  . Heart attack Father   . Pancreatic cancer Brother   . Colon polyps Brother   . Lung cancer Brother   . Heart disease Brother   . Colon cancer Brother   . Prostate cancer Brother   . Diabetes Brother        x 3 broderline DM  . Esophageal cancer Neg Hx   . Stomach cancer Neg Hx     Social History Social History   Tobacco Use  . Smoking status: Never Smoker  . Smokeless tobacco: Never Used  Substance Use Topics  . Alcohol use: No  .  Drug use: No     Allergies   Levofloxacin; Benicar hct [olmesartan medoxomil-hctz]; Celebrex [celecoxib]; Hydralazine; Metronidazole; Olmesartan medoxomil; Propoxyphene napsylate [propoxyphene]; and Clarithromycin   Review of Systems Review of Systems  Musculoskeletal: Negative for back pain, joint swelling and neck pain.  Skin: Negative for wound.  Neurological: Negative for weakness and numbness.     Physical Exam Updated Vital Signs BP (!) 173/75 (BP Location: Left Arm)   Pulse 98   Temp 98 F (36.7 C) (Oral)   Resp 18   Ht 5\' 8"  (1.727 m)   Wt 79.8 kg (176 lb)   SpO2 99%   BMI 26.76 kg/m   Physical Exam  Constitutional: He appears well-developed and well-nourished.  HENT:  Head: Normocephalic and atraumatic.  Eyes: Conjunctivae are normal. Right eye exhibits no discharge. Left eye exhibits no discharge.  Pulmonary/Chest: Effort normal. No respiratory distress.  Musculoskeletal: He exhibits tenderness.  The patient is able to straight leg raise bilaterally without any pain, he does have pain with use of the right hamstring, with extension of the lower extremity and flexion at the waist he has significant tenderness and has tenderness to palpation approximately 6 inches distal to the pelvic bones.  He has normal range of motion of the joints at rest.  He has no obvious deformities of the leg, normal sensation and strength distal to the hip    Neurological: He is alert. Coordination normal.  Skin: Skin is warm and dry. No rash noted. He is not diaphoretic. No erythema.  Psychiatric: He has a normal mood and affect.  Nursing note and vitals reviewed.    ED Treatments / Results  Labs (all labs ordered are listed, but only abnormal results are displayed) Labs Reviewed - No data to display  EKG  EKG Interpretation None       Radiology No results found.  Procedures Procedures (including critical care time)  Medications Ordered in ED Medications - No data to display   Initial Impression / Assessment and Plan / ED Course  I have reviewed the triage vital signs and the nursing notes.  Pertinent labs & imaging results that were available during my care of the patient were reviewed by me and considered in my medical decision making (see chart for details).     Hamstring strain, the patient is otherwise well-appearing, no indication for imaging, will prescribe Voltaren gel as well as lidocaine patches, the patient agrees to follow-up in the outpatient setting, stable  Final Clinical Impressions(s) / ED Diagnoses   Final diagnoses:  Strain of right hamstring, initial encounter    ED Discharge Orders        Ordered    diclofenac sodium (VOLTAREN) 1 % GEL  4 times daily     01/19/17 1701       Noemi Chapel, MD 01/19/17 1704

## 2017-01-19 NOTE — Discharge Instructions (Signed)
Your hamstring muscle has been strained, this will cause pain when you try to bend over at the waist, stretch or try to bend your knee.  Please avoid these actions as much as possible but still try to be generally mildly active with walking and going upstairs.  I would encourage you to apply topical diclofenac gel to the back of your thigh 4 times a day as needed, you may also benefit from topical lidocaine patches.  You may need to follow-up with your doctor in 1-2 weeks if still having significant pain.

## 2017-01-24 ENCOUNTER — Emergency Department (HOSPITAL_COMMUNITY)
Admission: EM | Admit: 2017-01-24 | Discharge: 2017-01-24 | Disposition: A | Discharge status: Home / Self Care | Payer: Medicare Other | Attending: Emergency Medicine | Admitting: Emergency Medicine

## 2017-01-24 ENCOUNTER — Encounter (HOSPITAL_COMMUNITY): Payer: Self-pay | Admitting: *Deleted

## 2017-01-24 ENCOUNTER — Other Ambulatory Visit: Payer: Self-pay

## 2017-01-24 ENCOUNTER — Emergency Department (HOSPITAL_COMMUNITY): Payer: Medicare Other

## 2017-01-24 DIAGNOSIS — N189 Chronic kidney disease, unspecified: Secondary | ICD-10-CM | POA: Insufficient documentation

## 2017-01-24 DIAGNOSIS — E1122 Type 2 diabetes mellitus with diabetic chronic kidney disease: Secondary | ICD-10-CM | POA: Diagnosis not present

## 2017-01-24 DIAGNOSIS — M25551 Pain in right hip: Secondary | ICD-10-CM | POA: Diagnosis not present

## 2017-01-24 DIAGNOSIS — S7001XA Contusion of right hip, initial encounter: Secondary | ICD-10-CM | POA: Diagnosis not present

## 2017-01-24 DIAGNOSIS — R52 Pain, unspecified: Secondary | ICD-10-CM

## 2017-01-24 DIAGNOSIS — K625 Hemorrhage of anus and rectum: Secondary | ICD-10-CM | POA: Insufficient documentation

## 2017-01-24 DIAGNOSIS — K921 Melena: Secondary | ICD-10-CM | POA: Diagnosis not present

## 2017-01-24 DIAGNOSIS — I129 Hypertensive chronic kidney disease with stage 1 through stage 4 chronic kidney disease, or unspecified chronic kidney disease: Secondary | ICD-10-CM | POA: Diagnosis not present

## 2017-01-24 DIAGNOSIS — Z8673 Personal history of transient ischemic attack (TIA), and cerebral infarction without residual deficits: Secondary | ICD-10-CM | POA: Insufficient documentation

## 2017-01-24 DIAGNOSIS — Z79899 Other long term (current) drug therapy: Secondary | ICD-10-CM | POA: Insufficient documentation

## 2017-01-24 DIAGNOSIS — Z7982 Long term (current) use of aspirin: Secondary | ICD-10-CM | POA: Diagnosis not present

## 2017-01-24 DIAGNOSIS — S79911A Unspecified injury of right hip, initial encounter: Secondary | ICD-10-CM | POA: Diagnosis not present

## 2017-01-24 LAB — POC OCCULT BLOOD, ED: Fecal Occult Bld: NEGATIVE

## 2017-01-24 LAB — CBC WITH DIFFERENTIAL/PLATELET
Basophils Absolute: 0 10*3/uL (ref 0.0–0.1)
Basophils Relative: 0 %
EOS PCT: 1 %
Eosinophils Absolute: 0.1 10*3/uL (ref 0.0–0.7)
HCT: 44.4 % (ref 39.0–52.0)
HEMOGLOBIN: 15.1 g/dL (ref 13.0–17.0)
LYMPHS ABS: 2.9 10*3/uL (ref 0.7–4.0)
LYMPHS PCT: 32 %
MCH: 32.3 pg (ref 26.0–34.0)
MCHC: 34 g/dL (ref 30.0–36.0)
MCV: 95.1 fL (ref 78.0–100.0)
MONOS PCT: 6 %
Monocytes Absolute: 0.5 10*3/uL (ref 0.1–1.0)
Neutro Abs: 5.5 10*3/uL (ref 1.7–7.7)
Neutrophils Relative %: 61 %
Platelets: 180 10*3/uL (ref 150–400)
RBC: 4.67 MIL/uL (ref 4.22–5.81)
RDW: 12.8 % (ref 11.5–15.5)
WBC: 8.9 10*3/uL (ref 4.0–10.5)

## 2017-01-24 LAB — BASIC METABOLIC PANEL
Anion gap: 11 (ref 5–15)
BUN: 22 mg/dL — AB (ref 6–20)
CHLORIDE: 102 mmol/L (ref 101–111)
CO2: 24 mmol/L (ref 22–32)
CREATININE: 1.13 mg/dL (ref 0.61–1.24)
Calcium: 9 mg/dL (ref 8.9–10.3)
GFR calc Af Amer: >60 mL/min (ref >60)
GFR calc non Af Amer: 60 mL/min — ABNORMAL LOW (ref >60)
GLUCOSE: 132 mg/dL — AB (ref 65–99)
POTASSIUM: 4.4 mmol/L (ref 3.5–5.1)
Sodium: 137 mmol/L (ref 135–145)

## 2017-01-24 MED ORDER — PANTOPRAZOLE SODIUM 40 MG PO TBEC
40.0000 mg | DELAYED_RELEASE_TABLET | Freq: Once | ORAL | Status: AC
  Administered 2017-01-24: 40 mg via ORAL
  Filled 2017-01-24: qty 1

## 2017-01-24 MED ORDER — PANTOPRAZOLE SODIUM 20 MG PO TBEC: 20.0000 mg | DELAYED_RELEASE_TABLET | Freq: Every day | ORAL | 0 refills | Status: DC

## 2017-01-24 MED ORDER — OXYCODONE-ACETAMINOPHEN 5-325 MG PO TABS
1.0000 | ORAL_TABLET | Freq: Once | ORAL | Status: AC
  Administered 2017-01-24: 1 via ORAL
  Filled 2017-01-24: qty 1

## 2017-01-24 MED ORDER — OXYCODONE-ACETAMINOPHEN 5-325 MG PO TABS: 1.0000 | ORAL_TABLET | Freq: Four times a day (QID) | ORAL | 0 refills | Status: DC | PRN

## 2017-01-24 NOTE — ED Notes (Signed)
Take home bottle of Percocet given to pt.

## 2017-01-24 NOTE — ED Provider Notes (Signed)
[facility] EMERGENCY DEPARTMENT Provider Note   CSN: [id number] Arrival date & time: 01/24/17  1519     History   Chief Complaint Chief Complaint  Patient presents with  . Melena    HPI Stephen [name] is a 82 y.o. male.  Patient complains that he had some black stools.  He has been having pain in his right hip when he had a fall recently.  He has been taking an anti-inflammatory gel and Motrin and also taking aspirin daily which is what he supposed to do.  Patient had one episode of black stools   The history is provided by the patient.  Rectal Bleeding  Quality:  Black and tarry Amount:  Scant Timing:  Rare Chronicity:  New Context: not anal fissures   Similar prior episodes: no   Relieved by:  Nothing Worsened by:  Nothing Ineffective treatments:  None tried Associated symptoms: no abdominal pain     Past Medical History:  Diagnosis Date  . Adenomatous colon polyp   . Allergy   . Arthritis   . BPH (benign prostatic hypertrophy)   . Cataract    bil cataracts removed  . Chest pain   . CKD (chronic kidney disease)   . Diabetes mellitus without complication ([place])    controlled with diet  . Diverticulosis   . GERD (gastroesophageal reflux disease)   . Glaucoma   . Helicobacter pylori (H. pylori)   . Hemorrhoids   . Hyperlipidemia   . Hypertension   . Kidney stones   . TIA (transient ischemic attack)    "side affect from medication had symptoms of TIA"  . UTI (lower urinary tract infection)     Patient Active Problem List   Diagnosis Date Noted  . Carotid artery plaque 07/31/2013  . Amaurosis fugax, left eye 07/02/2013  . TIA (transient ischemic attack) 07/02/2013  . Visual changes 07/02/2013  . HYPERLIPIDEMIA 02/13/2009  . HYPERTENSION, BENIGN 02/13/2009  . TRANSIENT ISCHEMIC ATTACK 02/13/2009  . NEPHROLITHIASIS 02/13/2009  . CHEST PAIN 02/13/2009  . BENIGN PROSTATIC HYPERTROPHY, HX OF 02/13/2009  . ADENOMATOUS COLONIC POLYP 11/15/2007  .  PERSONAL HX COLONIC POLYPS 11/15/2007    Past Surgical History:  Procedure Laterality Date  . CATARACT EXTRACTION Bilateral   . COLONOSCOPY    . CYSTOSCOPY WITH STENT PLACEMENT Left 05/01/2012   Procedure: CYSTOSCOPY WITH URETEROSCOPY AND STONE EXTRACTION, LEFT STENT PLACEMENT ;  Surgeon: Malka So, MD;  Location: [facility];  Service: Urology;  Laterality: Left;  STONE EXTRACTION  STENT PLACEMENT    . ESOPHAGOGASTRODUODENOSCOPY ENDOSCOPY    . LITHOTRIPSY         Home Medications    Prior to Admission medications   Medication Sig Start Date End Date Taking? Authorizing Provider  Alpha-Lipoic Acid 200 MG CAPS Take 1 capsule by mouth daily.   Yes [provider]  AMBULATORY NON FORMULARY MEDICATION Flaxseed Meal 1/4 cup 3-4 times/week   Yes [provider]  AMBULATORY NON FORMULARY MEDICATION Black Cherry 1000 mg Take 1 tablet by mouth twice daily   Yes [provider]  AMBULATORY NON FORMULARY MEDICATION Healthy Prostate  Take 1 tablet by mouth twice daily   Yes [provider]  amLODipine (NORVASC) 5 MG tablet Take 5 mg by mouth at bedtime.   Yes [provider]  Ascorbic Acid (VITAMIN C) 1000 MG tablet Take 1,000 mg by mouth 2 (two) times daily.   Yes [provider]  aspirin 325 MG tablet Take  1 tablet (325 mg total) by mouth daily. 07/02/13  Yes Kelvin Cellar, MD  Berberine Chloride POWD Take 500 mg by mouth 2 (two) times daily with a meal.   Yes [provider]  Cholecalciferol (VITAMIN D3) 5000 UNITS TABS Take 1 tablet by mouth daily.   Yes [provider]  Chromium Picolinate (V-R CHROMIUM PICOLINATE) 200 MCG TABS Take 1 tablet by mouth 2 (two) times daily.     Yes [provider]  Coenzyme Q10 (CO Q 10) 100 MG CAPS Take 1 capsule by mouth 2 (two) times daily.   Yes [provider]  Cyanocobalamin (VITAMIN B12) 3000 MCG/ML LIQD Place 1 tablet under the tongue daily.   Yes [provider]  diclofenac sodium (VOLTAREN) 1 % GEL Apply 4 g topically 4 (four) times daily. 01/19/17  Yes Noemi Chapel, MD  Docusate Sodium (DULCOLAX STOOL SOFTENER PO) Take 1 capsule by mouth 2 (two) times daily. Takes twice daily   Yes [provider]  Evening Primrose Oil 500 MG CAPS Take 1 capsule by mouth 2 (two) times daily.   Yes [provider]  Gymnema Sylvestris Leaf POWD Take 400 mg by mouth 2 (two) times daily with a meal. TABLET FORM   Yes [provider]  Lactobacillus-Inulin ([place] PO) Take 1 capsule by mouth as needed. When taking antibiotics   Yes [provider]  Magnesium 500 MG TABS Take 1 tablet by mouth 2 (two) times daily.     Yes [provider]  metoprolol (LOPRESSOR) 50 MG tablet Take 50 mg by mouth 2 (two) times daily. 06/28/13  Yes [provider]  niacin (NIASPAN) 1000 MG CR tablet Take 1,000 mg by mouth at bedtime.    Yes [provider]  Omega 3-6-9 Fatty Acids (TRIPLE OMEGA-3-6-9) CAPS Take 1 capsule by mouth 2 (two) times daily.   Yes [provider]  Probiotic Product (PROBIOTIC FORMULA) CAPS Take 1 capsule by mouth every morning.    Yes [provider]  rosuvastatin (CRESTOR) 5 MG tablet Take 2.5 mg by mouth at bedtime.   Yes [provider]  timolol (BETIMOL) 0.5 % ophthalmic solution Place 1 drop into both eyes daily.   Yes [provider]  vitamin E (VITAMIN E) 400 UNIT capsule Take 400 Units by mouth 2 (two) times daily.     Yes [provider]  vitamin k 100 MCG tablet Take 150 mcg by mouth every morning.    Yes [provider]  Wheat Dextrin (BENEFIBER PO) Take 10 mLs by mouth 2 (two) times daily.   Yes [provider]  Multiple Vitamin (MULTIVITAMIN WITH MINERALS) TABS tablet Take 1 tablet by mouth daily.    [provider]  oxyCODONE-acetaminophen (PERCOCET/ROXICET) 5-325 MG tablet Take 1 tablet by  mouth every 6 (six) hours as needed. 01/24/17   Milton Ferguson, MD  oxyCODONE-acetaminophen (PERCOCET/ROXICET) 5-325 MG tablet Take 1 tablet by mouth every 6 (six) hours as needed. 01/24/17   Milton Ferguson, MD    Family History Family History  Problem Relation Age of Onset  . Heart attack Father   . Pancreatic cancer Brother   . Colon polyps Brother   . Lung cancer Brother   . Heart disease Brother   . Colon cancer Brother   . Prostate cancer Brother   . Diabetes Brother        x 3 broderline DM  . Esophageal cancer Neg Hx   . Stomach cancer  Neg Hx     Social History Social History   Tobacco Use  . Smoking status: Never Smoker  . Smokeless tobacco: Never Used  Substance Use Topics  . Alcohol use: No  . Drug use: No     Allergies   Levofloxacin; Benicar hct [olmesartan medoxomil-hctz]; Celebrex [celecoxib]; Combigan [brimonidine tartrate-timolol]; Hydralazine; Metronidazole; Olmesartan medoxomil; Propoxyphene napsylate [propoxyphene]; and Clarithromycin   Review of Systems Review of Systems  Constitutional: Negative for appetite change and fatigue.  HENT: Negative for congestion, ear discharge and sinus pressure.   Eyes: Negative for discharge.  Respiratory: Negative for cough.   Cardiovascular: Negative for chest pain.  Gastrointestinal: Positive for hematochezia. Negative for abdominal pain and diarrhea.       Black stool  Genitourinary: Negative for frequency and hematuria.  Musculoskeletal: Negative for back pain.  Skin: Negative for rash.  Neurological: Negative for seizures and headaches.  Psychiatric/Behavioral: Negative for hallucinations.     Physical Exam Updated Vital Signs BP (!) 146/84 (BP Location: Right Arm)   Pulse 60   Temp 97.7 F (36.5 C) (Oral)   Resp 16   Ht 5\' 8"  (1.727 m)   Wt 80.3 kg (177 lb)   SpO2 98%   BMI 26.91 kg/m   Physical Exam  Constitutional: He is oriented to person, place, and time. He appears well-developed.  HENT:    Head: Normocephalic.  Eyes: Conjunctivae and EOM are normal. No scleral icterus.  Neck: Neck supple. No thyromegaly present.  Cardiovascular: Normal rate and regular rhythm. Exam reveals no gallop and no friction rub.  No murmur heard. Pulmonary/Chest: No stridor. He has no wheezes. He has no rales. He exhibits no tenderness.  Abdominal: He exhibits no distension. There is no tenderness. There is no rebound.  Genitourinary:  Genitourinary Comments: Rectal exam shows brown stool heme negative  Musculoskeletal: Normal range of motion. He exhibits no edema.  Tender right hip  Lymphadenopathy:    He has no cervical adenopathy.  Neurological: He is oriented to person, place, and time. He exhibits normal muscle tone. Coordination normal.  Skin: No rash noted. No erythema.  Psychiatric: He has a normal mood and affect. His behavior is normal.     ED Treatments / Results  Labs (all labs ordered are listed, but only abnormal results are displayed) Labs Reviewed  BASIC METABOLIC PANEL - Abnormal; Notable for the following components:      Result Value   Glucose, Bld 132 (*)    BUN 22 (*)    GFR calc non Af Amer 60 (*)    All other components within normal limits  CBC WITH DIFFERENTIAL/PLATELET  POC OCCULT BLOOD, ED    EKG  EKG Interpretation None       Radiology Dg Hip Unilat With Pelvis 2-3 Views Right  Result Date: 01/24/2017 CLINICAL DATA:  Pain with recent fall. EXAM: DG HIP (WITH OR WITHOUT PELVIS) 2-3V RIGHT COMPARISON:  None. FINDINGS: AP view the pelvis and AP/frog leg views of the right hip. Femoral heads are located. Sacroiliac joints are symmetric. No acute fracture. Mild degenerate changes involve both hips. IMPRESSION: Degenerative change, without acute osseous finding. Electronically Signed   By: Abigail Miyamoto M.D.   On: 01/24/2017 19:19    Procedures Procedures (including critical care time)  Medications Ordered in ED Medications  oxyCODONE-acetaminophen  (PERCOCET/ROXICET) 5-325 MG per tablet 1 tablet (1 tablet Oral Given 01/24/17 1847)     Initial Impression / Assessment and Plan / ED Course  I  have reviewed the triage vital signs and the nursing notes.  Pertinent labs & imaging results that were available during my care of the patient were reviewed by me and considered in my medical decision making (see chart for details).     Patient with contusion and painful right hip.  He felt better with the Percocet and will be given some Percocet for that pain.  He will follow-up with his PCP for that.  He will stop taking the Motrin and the anti-inflammatory gel.  He will continue his aspirin unless he has more black stools.  And he will follow-up with his doctor next week  Final Clinical Impressions(s) / ED Diagnoses   Final diagnoses:  Rectal bleeding    ED Discharge Orders        Ordered    oxyCODONE-acetaminophen (PERCOCET/ROXICET) 5-325 MG tablet  Every 6 hours PRN     01/24/17 2008    oxyCODONE-acetaminophen (PERCOCET/ROXICET) 5-325 MG tablet  Every 6 hours PRN     01/24/17 2008       Milton Ferguson, MD 01/24/17 2015

## 2017-01-24 NOTE — ED Notes (Signed)
ED Provider at bedside. 

## 2017-01-24 NOTE — Discharge Instructions (Signed)
Follow-up with your doctor next week for recheck.  If you have more black stools stop taking her aspirin and see her doctor.  Take only Tylenol or the Percocet as needed for your hip pain

## 2017-01-24 NOTE — ED Triage Notes (Signed)
States he had a black tarry stool today, thinks it may be a reaction to diclofenac gel used for hip pain

## 2017-01-25 MED FILL — Oxycodone w/ Acetaminophen Tab 5-325 MG: ORAL | Qty: 6 | Status: AC

## 2017-01-28 ENCOUNTER — Encounter (HOSPITAL_COMMUNITY): Payer: Self-pay | Admitting: Emergency Medicine

## 2017-01-28 ENCOUNTER — Emergency Department (HOSPITAL_COMMUNITY)
Admission: EM | Admit: 2017-01-28 | Discharge: 2017-01-28 | Disposition: A | Discharge status: Home / Self Care | Payer: Medicare Other | Attending: Emergency Medicine | Admitting: Emergency Medicine

## 2017-01-28 ENCOUNTER — Emergency Department (HOSPITAL_COMMUNITY): Payer: Medicare Other

## 2017-01-28 DIAGNOSIS — Z7982 Long term (current) use of aspirin: Secondary | ICD-10-CM | POA: Diagnosis not present

## 2017-01-28 DIAGNOSIS — S76311A Strain of muscle, fascia and tendon of the posterior muscle group at thigh level, right thigh, initial encounter: Secondary | ICD-10-CM | POA: Diagnosis not present

## 2017-01-28 DIAGNOSIS — M79604 Pain in right leg: Secondary | ICD-10-CM | POA: Insufficient documentation

## 2017-01-28 DIAGNOSIS — I129 Hypertensive chronic kidney disease with stage 1 through stage 4 chronic kidney disease, or unspecified chronic kidney disease: Secondary | ICD-10-CM | POA: Diagnosis not present

## 2017-01-28 DIAGNOSIS — K5903 Drug induced constipation: Secondary | ICD-10-CM | POA: Insufficient documentation

## 2017-01-28 DIAGNOSIS — Z794 Long term (current) use of insulin: Secondary | ICD-10-CM | POA: Diagnosis not present

## 2017-01-28 DIAGNOSIS — T40695A Adverse effect of other narcotics, initial encounter: Secondary | ICD-10-CM | POA: Diagnosis not present

## 2017-01-28 DIAGNOSIS — Z79899 Other long term (current) drug therapy: Secondary | ICD-10-CM | POA: Insufficient documentation

## 2017-01-28 DIAGNOSIS — E119 Type 2 diabetes mellitus without complications: Secondary | ICD-10-CM | POA: Diagnosis not present

## 2017-01-28 DIAGNOSIS — K5909 Other constipation: Secondary | ICD-10-CM | POA: Diagnosis not present

## 2017-01-28 DIAGNOSIS — K5900 Constipation, unspecified: Secondary | ICD-10-CM | POA: Diagnosis not present

## 2017-01-28 DIAGNOSIS — N189 Chronic kidney disease, unspecified: Secondary | ICD-10-CM | POA: Insufficient documentation

## 2017-01-28 MED ORDER — CYCLOBENZAPRINE HCL 5 MG PO TABS: 5.0000 mg | ORAL_TABLET | Freq: Two times a day (BID) | ORAL | 0 refills | Status: DC | PRN

## 2017-01-28 MED ORDER — KETOROLAC TROMETHAMINE 30 MG/ML IJ SOLN
30.0000 mg | Freq: Once | INTRAMUSCULAR | Status: AC
  Administered 2017-01-28: 30 mg via INTRAMUSCULAR
  Filled 2017-01-28: qty 1

## 2017-01-28 MED ORDER — SORBITOL 70 % SOLN
960.0000 mL | TOPICAL_OIL | Freq: Once | ORAL | Status: AC
  Administered 2017-01-28: 960 mL via RECTAL
  Filled 2017-01-28: qty 473

## 2017-01-28 MED ORDER — POLYETHYLENE GLYCOL 3350 17 G PO PACK: 17.0000 g | PACK | Freq: Every day | ORAL | 0 refills | Status: DC

## 2017-01-28 NOTE — ED Triage Notes (Signed)
Patient here from home with complaints of fall on 12/17. States that he is here to "follow up". Reports that they gave him diclofenac gel and oxycodone. Pain "has gone down, but still there". Also states that he hasn't had a "good" bowel movement in 8 days.

## 2017-01-28 NOTE — ED Provider Notes (Signed)
[facility] DEPT Provider Note   CSN: [id number] Arrival date & time: 01/28/17  0601     History   Chief Complaint Chief Complaint  Patient presents with  . Fall    HPI Stephen [name] is a 82 y.o. male.  Pt presents to the ED today with 2 problems.  He fell on 12/17 and was seen in the ED at [facility].  The pt was diagnosed with a right hamstring tear.  The pt was put on percocet and now is constipated.  He has not had a bm in 8 days.  He said his leg is still hurting.  He has not taken any otc constipation meds.      Past Medical History:  Diagnosis Date  . Adenomatous colon polyp   . Allergy   . Arthritis   . BPH (benign prostatic hypertrophy)   . Cataract    bil cataracts removed  . Chest pain   . CKD (chronic kidney disease)   . Diabetes mellitus without complication ([place])    controlled with diet  . Diverticulosis   . GERD (gastroesophageal reflux disease)   . Glaucoma   . Helicobacter pylori (H. pylori)   . Hemorrhoids   . Hyperlipidemia   . Hypertension   . Kidney stones   . TIA (transient ischemic attack)    "side affect from medication had symptoms of TIA"  . UTI (lower urinary tract infection)     Patient Active Problem List   Diagnosis Date Noted  . Carotid artery plaque 07/31/2013  . Amaurosis fugax, left eye 07/02/2013  . TIA (transient ischemic attack) 07/02/2013  . Visual changes 07/02/2013  . HYPERLIPIDEMIA 02/13/2009  . HYPERTENSION, BENIGN 02/13/2009  . TRANSIENT ISCHEMIC ATTACK 02/13/2009  . NEPHROLITHIASIS 02/13/2009  . CHEST PAIN 02/13/2009  . BENIGN PROSTATIC HYPERTROPHY, HX OF 02/13/2009  . ADENOMATOUS COLONIC POLYP 11/15/2007  . PERSONAL HX COLONIC POLYPS 11/15/2007    Past Surgical History:  Procedure Laterality Date  . CATARACT EXTRACTION Bilateral   . COLONOSCOPY    . CYSTOSCOPY WITH STENT PLACEMENT Left 05/01/2012   Procedure: CYSTOSCOPY WITH URETEROSCOPY AND STONE EXTRACTION, LEFT STENT PLACEMENT  ;  Surgeon: Malka So, MD;  Location: [facility];  Service: Urology;  Laterality: Left;  STONE EXTRACTION  STENT PLACEMENT    . ESOPHAGOGASTRODUODENOSCOPY ENDOSCOPY    . LITHOTRIPSY         Home Medications    Prior to Admission medications   Medication Sig Start Date End Date Taking? Authorizing Provider  Alpha-Lipoic Acid 200 MG CAPS Take 1 capsule by mouth daily.    [provider]  AMBULATORY NON FORMULARY MEDICATION Flaxseed Meal 1/4 cup 3-4 times/week    [provider]  AMBULATORY NON FORMULARY MEDICATION Black Cherry 1000 mg Take 1 tablet by mouth twice daily    [provider]  AMBULATORY NON FORMULARY MEDICATION Healthy Prostate  Take 1 tablet by mouth twice daily    [provider]  amLODipine (NORVASC) 5 MG tablet Take 5 mg by mouth at bedtime.    [provider]  Ascorbic Acid (VITAMIN C) 1000 MG tablet Take 1,000 mg by mouth 2 (two) times daily.    [provider]  aspirin 325 MG tablet Take 1 tablet (325 mg total) by mouth daily. 07/02/13   Kelvin Cellar, MD  Berberine Chloride POWD Take 500 mg by mouth 2 (two) times daily with a meal.    [provider]  Cholecalciferol (VITAMIN  D3) 5000 UNITS TABS Take 1 tablet by mouth daily.    [provider]  Chromium Picolinate (V-R CHROMIUM PICOLINATE) 200 MCG TABS Take 1 tablet by mouth 2 (two) times daily.      [provider]  Coenzyme Q10 (CO Q 10) 100 MG CAPS Take 1 capsule by mouth 2 (two) times daily.    [provider]  Cyanocobalamin (VITAMIN B12) 3000 MCG/ML LIQD Place 1 tablet under the tongue daily.    [provider]  cyclobenzaprine (FLEXERIL) 5 MG tablet Take 1 tablet (5 mg total) by mouth 2 (two) times daily as needed (leg pain). 01/28/17   Isla Pence, MD  diclofenac sodium (VOLTAREN) 1 % GEL Apply 4 g topically 4 (four) times daily. 01/19/17   Noemi Chapel, MD  Docusate Sodium (DULCOLAX STOOL SOFTENER PO) Take  1 capsule by mouth 2 (two) times daily. Takes twice daily    [provider]  Evening Primrose Oil 500 MG CAPS Take 1 capsule by mouth 2 (two) times daily.    [provider]  Gymnema Sylvestris Leaf POWD Take 400 mg by mouth 2 (two) times daily with a meal. TABLET FORM    [provider]  Lactobacillus-Inulin ([place] PO) Take 1 capsule by mouth as needed. When taking antibiotics    [provider]  Magnesium 500 MG TABS Take 1 tablet by mouth 2 (two) times daily.      [provider]  metoprolol (LOPRESSOR) 50 MG tablet Take 50 mg by mouth 2 (two) times daily. 06/28/13   [provider]  Multiple Vitamin (MULTIVITAMIN WITH MINERALS) TABS tablet Take 1 tablet by mouth daily.    [provider]  niacin (NIASPAN) 1000 MG CR tablet Take 1,000 mg by mouth at bedtime.     [provider]  Omega 3-6-9 Fatty Acids (TRIPLE OMEGA-3-6-9) CAPS Take 1 capsule by mouth 2 (two) times daily.    [provider]  oxyCODONE-acetaminophen (PERCOCET/ROXICET) 5-325 MG tablet Take 1 tablet by mouth every 6 (six) hours as needed. 01/24/17   Milton Ferguson, MD  oxyCODONE-acetaminophen (PERCOCET/ROXICET) 5-325 MG tablet Take 1 tablet by mouth every 6 (six) hours as needed. 01/24/17   Milton Ferguson, MD  pantoprazole (PROTONIX) 20 MG tablet Take 1 tablet (20 mg total) by mouth daily. 01/24/17   Milton Ferguson, MD  polyethylene glycol [facility]) packet Take 17 g by mouth daily. 01/28/17   Isla Pence, MD  Probiotic Product (PROBIOTIC FORMULA) CAPS Take 1 capsule by mouth every morning.     [provider]  rosuvastatin (CRESTOR) 5 MG tablet Take 2.5 mg by mouth at bedtime.    [provider]  timolol (BETIMOL) 0.5 % ophthalmic solution Place 1 drop into both eyes daily.    [provider]  vitamin E (VITAMIN E) 400 UNIT capsule Take 400 Units by mouth 2 (two) times daily.      [provider]    vitamin k 100 MCG tablet Take 150 mcg by mouth every morning.     [provider]  Wheat Dextrin (BENEFIBER PO) Take 10 mLs by mouth 2 (two) times daily.    [provider]    Family History Family History  Problem Relation Age of Onset  . Heart attack Father   . Pancreatic cancer Brother   . Colon polyps Brother   . Lung cancer Brother   . Heart disease Brother   . Colon cancer Brother   . Prostate cancer  Brother   . Diabetes Brother        x 3 broderline DM  . Esophageal cancer Neg Hx   . Stomach cancer Neg Hx     Social History Social History   Tobacco Use  . Smoking status: Never Smoker  . Smokeless tobacco: Never Used  Substance Use Topics  . Alcohol use: No  . Drug use: No     Allergies   Levofloxacin; Benicar hct [olmesartan medoxomil-hctz]; Celebrex [celecoxib]; Combigan [brimonidine tartrate-timolol]; Hydralazine; Metronidazole; Olmesartan medoxomil; Propoxyphene napsylate [propoxyphene]; and Clarithromycin   Review of Systems Review of Systems  Gastrointestinal: Positive for constipation.  Musculoskeletal:       Right posterior leg pain  All other systems reviewed and are negative.    Physical Exam Updated Vital Signs BP (!) 168/78 (BP Location: Left Arm)   Pulse 66   Temp 97.9 F (36.6 C) (Oral)   Resp 16   Ht 5\' 8"  (1.727 m)   Wt 80.3 kg (177 lb)   SpO2 99%   BMI 26.91 kg/m   Physical Exam  Constitutional: He is oriented to person, place, and time. He appears well-developed and well-nourished.  HENT:  Head: Normocephalic and atraumatic.  Right Ear: External ear normal.  Left Ear: External ear normal.  Nose: Nose normal.  Mouth/Throat: Oropharynx is clear and moist.  Eyes: Conjunctivae and EOM are normal. Pupils are equal, round, and reactive to light.  Neck: Normal range of motion. Neck supple.  Cardiovascular: Normal rate, regular rhythm, normal heart sounds and intact distal pulses.  Pulmonary/Chest: Effort normal  and breath sounds normal.  Abdominal: Soft. Bowel sounds are decreased.  Musculoskeletal: Normal range of motion.       Legs: Neurological: He is alert and oriented to person, place, and time.  Skin: Skin is warm. Capillary refill takes less than 2 seconds.  Psychiatric: He has a normal mood and affect. His behavior is normal. Judgment and thought content normal.  Nursing note and vitals reviewed.    ED Treatments / Results  Labs (all labs ordered are listed, but only abnormal results are displayed) Labs Reviewed - No data to display  EKG  EKG Interpretation None       Radiology Dg Abdomen Acute W/chest  Result Date: 01/28/2017 CLINICAL DATA:  Patient with constipation. EXAM: DG ABDOMEN ACUTE W/ 1V CHEST COMPARISON:  Abdominal radiograph 04/12/2016. FINDINGS: Normal cardiac and mediastinal contours. No consolidative pulmonary opacities. No pleural effusion or pneumothorax. Stool is demonstrated throughout the colon. No evidence for small bowel obstruction. No definite free intraperitoneal air. Lumbar spine degenerative changes. IMPRESSION: Large amount of stool throughout the colon as can be seen with constipation. No acute cardiopulmonary process. Electronically Signed   By: Lovey Newcomer M.D.   On: 01/28/2017 10:40    Procedures Procedures (including critical care time)  Medications Ordered in ED Medications  sorbitol, milk of mag, mineral oil, glycerin (SMOG) enema (960 mLs Rectal Given 01/28/17 1148)  ketorolac (TORADOL) 30 MG/ML injection 30 mg (30 mg Intramuscular Given 01/28/17 1019)     Initial Impression / Assessment and Plan / ED Course  I have reviewed the triage vital signs and the nursing notes.  Pertinent labs & imaging results that were available during my care of the patient were reviewed by me and considered in my medical decision making (see chart for details).    Pain a little better after toradol.  Pt had a large bm after enema.  He is told to avoid narcotics  as they cause constipation.  Eat a high fiber diet and return if worse.  Final Clinical Impressions(s) / ED Diagnoses   Final diagnoses:  Drug-induced constipation  Partial hamstring tear, right, initial encounter    ED Discharge Orders        Ordered    polyethylene glycol (MIRALAX) packet  Daily     01/28/17 1224    cyclobenzaprine (FLEXERIL) 5 MG tablet  2 times daily PRN     01/28/17 1224       Isla Pence, MD 01/28/17 1225

## 2017-02-09 DIAGNOSIS — Z9181 History of falling: Secondary | ICD-10-CM | POA: Diagnosis not present

## 2017-02-09 DIAGNOSIS — Z6826 Body mass index (BMI) 26.0-26.9, adult: Secondary | ICD-10-CM | POA: Diagnosis not present

## 2017-02-09 DIAGNOSIS — Z712 Person consulting for explanation of examination or test findings: Secondary | ICD-10-CM | POA: Diagnosis not present

## 2017-02-09 DIAGNOSIS — K5900 Constipation, unspecified: Secondary | ICD-10-CM | POA: Diagnosis not present

## 2017-02-09 DIAGNOSIS — S76311A Strain of muscle, fascia and tendon of the posterior muscle group at thigh level, right thigh, initial encounter: Secondary | ICD-10-CM | POA: Diagnosis not present

## 2017-02-20 DIAGNOSIS — L812 Freckles: Secondary | ICD-10-CM | POA: Diagnosis not present

## 2017-02-20 DIAGNOSIS — C44319 Basal cell carcinoma of skin of other parts of face: Secondary | ICD-10-CM | POA: Diagnosis not present

## 2017-02-20 DIAGNOSIS — D225 Melanocytic nevi of trunk: Secondary | ICD-10-CM | POA: Diagnosis not present

## 2017-02-20 DIAGNOSIS — D485 Neoplasm of uncertain behavior of skin: Secondary | ICD-10-CM | POA: Diagnosis not present

## 2017-02-20 DIAGNOSIS — L821 Other seborrheic keratosis: Secondary | ICD-10-CM | POA: Diagnosis not present

## 2017-03-09 DIAGNOSIS — C44319 Basal cell carcinoma of skin of other parts of face: Secondary | ICD-10-CM | POA: Diagnosis not present

## 2017-03-09 DIAGNOSIS — Z85828 Personal history of other malignant neoplasm of skin: Secondary | ICD-10-CM | POA: Diagnosis not present

## 2017-04-07 DIAGNOSIS — J069 Acute upper respiratory infection, unspecified: Secondary | ICD-10-CM | POA: Diagnosis not present

## 2017-04-07 DIAGNOSIS — R946 Abnormal results of thyroid function studies: Secondary | ICD-10-CM | POA: Diagnosis not present

## 2017-04-07 DIAGNOSIS — I10 Essential (primary) hypertension: Secondary | ICD-10-CM | POA: Diagnosis not present

## 2017-04-07 DIAGNOSIS — Z6827 Body mass index (BMI) 27.0-27.9, adult: Secondary | ICD-10-CM | POA: Diagnosis not present

## 2017-04-07 DIAGNOSIS — S76311A Strain of muscle, fascia and tendon of the posterior muscle group at thigh level, right thigh, initial encounter: Secondary | ICD-10-CM | POA: Diagnosis not present

## 2017-04-07 DIAGNOSIS — R972 Elevated prostate specific antigen [PSA]: Secondary | ICD-10-CM | POA: Diagnosis not present

## 2017-04-07 DIAGNOSIS — R944 Abnormal results of kidney function studies: Secondary | ICD-10-CM | POA: Diagnosis not present

## 2017-04-07 DIAGNOSIS — K5900 Constipation, unspecified: Secondary | ICD-10-CM | POA: Diagnosis not present

## 2017-04-07 DIAGNOSIS — Z6826 Body mass index (BMI) 26.0-26.9, adult: Secondary | ICD-10-CM | POA: Diagnosis not present

## 2017-04-07 DIAGNOSIS — Z9181 History of falling: Secondary | ICD-10-CM | POA: Diagnosis not present

## 2017-04-07 DIAGNOSIS — Z712 Person consulting for explanation of examination or test findings: Secondary | ICD-10-CM | POA: Diagnosis not present

## 2017-04-07 DIAGNOSIS — E119 Type 2 diabetes mellitus without complications: Secondary | ICD-10-CM | POA: Diagnosis not present

## 2017-04-18 DIAGNOSIS — J309 Allergic rhinitis, unspecified: Secondary | ICD-10-CM | POA: Diagnosis not present

## 2017-04-18 DIAGNOSIS — E782 Mixed hyperlipidemia: Secondary | ICD-10-CM | POA: Diagnosis not present

## 2017-04-18 DIAGNOSIS — Z6826 Body mass index (BMI) 26.0-26.9, adult: Secondary | ICD-10-CM | POA: Diagnosis not present

## 2017-04-18 DIAGNOSIS — R946 Abnormal results of thyroid function studies: Secondary | ICD-10-CM | POA: Diagnosis not present

## 2017-04-18 DIAGNOSIS — C44310 Basal cell carcinoma of skin of unspecified parts of face: Secondary | ICD-10-CM | POA: Diagnosis not present

## 2017-04-18 DIAGNOSIS — I10 Essential (primary) hypertension: Secondary | ICD-10-CM | POA: Diagnosis not present

## 2017-04-18 DIAGNOSIS — R944 Abnormal results of kidney function studies: Secondary | ICD-10-CM | POA: Diagnosis not present

## 2017-04-18 DIAGNOSIS — R972 Elevated prostate specific antigen [PSA]: Secondary | ICD-10-CM | POA: Diagnosis not present

## 2017-04-18 DIAGNOSIS — E119 Type 2 diabetes mellitus without complications: Secondary | ICD-10-CM | POA: Diagnosis not present

## 2017-04-28 ENCOUNTER — Telehealth: Payer: Self-pay | Admitting: Internal Medicine

## 2017-04-28 NOTE — Telephone Encounter (Signed)
Discussed with pt that he can take miralax for constipation until his OV.

## 2017-05-08 ENCOUNTER — Encounter: Payer: Self-pay | Admitting: Internal Medicine

## 2017-05-09 DIAGNOSIS — H401113 Primary open-angle glaucoma, right eye, severe stage: Secondary | ICD-10-CM | POA: Diagnosis not present

## 2017-05-09 DIAGNOSIS — H401122 Primary open-angle glaucoma, left eye, moderate stage: Secondary | ICD-10-CM | POA: Diagnosis not present

## 2017-06-02 ENCOUNTER — Ambulatory Visit (INDEPENDENT_AMBULATORY_CARE_PROVIDER_SITE_OTHER): Payer: Medicare Other | Admitting: Urology

## 2017-06-02 ENCOUNTER — Ambulatory Visit (INDEPENDENT_AMBULATORY_CARE_PROVIDER_SITE_OTHER): Payer: Medicare Other | Admitting: Internal Medicine

## 2017-06-02 ENCOUNTER — Encounter: Payer: Self-pay | Admitting: Internal Medicine

## 2017-06-02 VITALS — BP 132/80 | HR 74 | Ht 66.75 in | Wt 177.0 lb

## 2017-06-02 DIAGNOSIS — N200 Calculus of kidney: Secondary | ICD-10-CM

## 2017-06-02 DIAGNOSIS — Z86010 Personal history of colonic polyps: Secondary | ICD-10-CM

## 2017-06-02 DIAGNOSIS — K5901 Slow transit constipation: Secondary | ICD-10-CM

## 2017-06-02 MED ORDER — NA SULFATE-K SULFATE-MG SULF 17.5-3.13-1.6 GM/177ML PO SOLN: 1.0000 | Freq: Once | ORAL | 0 refills | Status: AC

## 2017-06-02 NOTE — Patient Instructions (Signed)

## 2017-06-02 NOTE — Progress Notes (Signed)
HISTORY OF PRESENT ILLNESS:  Stephen [name] is a very pleasant 82 y.o. male with past medical history as listed below who is followed in this office for history of multiple adenomatous colon polyps including advanced lesions and a family history of colon cancer in his brother. He presents today regarding problems with constipation and surveillance colonoscopy. He also questions whether he needs an upper endoscopy. His last colonoscopy was March 2016. 6 polyps at that time. Index exam 2009 with follow-up exams 2009, 2010, 2011, 2013. Also noted to have moderate diverticulosis. Last upper endoscopy for history of Helicobacter pylori March 2016. The examination was essentially normal. Biopsies for Helicobacter were negative. The patient denies reflux, abdominal pain, dyspepsia, difficulties eating, etc. Does have occasional indigestion. Recently had significant chronic constipation. When to the emergency room. X-ray showed increased stool. MiraLAX recommended. The patient now is using flaxseed with good results. No lower GI complaints. Review of outside blood work from 04/07/2017 shows unremarkable CBC with hemoglobin 15.1. Comprehensive metabolic panel with glucose 152. Otherwise normal. Normal liver tests. Hemoglobin A1c 6.7. Plain film of the abdomen 01/28/2017 with large amount of stool in the colon.  REVIEW OF SYSTEMS:  All non-GI ROS negative except for back pain  Past Medical History:  Diagnosis Date  . Adenomatous colon polyp   . Allergy   . Arthritis   . BPH (benign prostatic hypertrophy)   . Cataract    bil cataracts removed  . Chest pain   . CKD (chronic kidney disease)   . Diabetes mellitus without complication ([place])    controlled with diet  . Diverticulosis   . GERD (gastroesophageal reflux disease)   . Glaucoma   . Helicobacter pylori (H. pylori)   . Hemorrhoids   . Hyperlipidemia   . Hypertension   . Kidney stones   . TIA (transient ischemic attack)    "side affect from  medication had symptoms of TIA"  . UTI (lower urinary tract infection)     Past Surgical History:  Procedure Laterality Date  . CATARACT EXTRACTION Bilateral   . COLONOSCOPY    . CYSTOSCOPY WITH STENT PLACEMENT Left 05/01/2012   Procedure: CYSTOSCOPY WITH URETEROSCOPY AND STONE EXTRACTION, LEFT STENT PLACEMENT ;  Surgeon: Malka So, MD;  Location: [facility];  Service: Urology;  Laterality: Left;  STONE EXTRACTION  STENT PLACEMENT    . ESOPHAGOGASTRODUODENOSCOPY ENDOSCOPY    . LITHOTRIPSY      Social History EMANNUEL VISE  reports that he has never smoked. He has never used smokeless tobacco. He reports that he does not drink alcohol or use drugs.  family history includes Colon cancer in his brother; Colon polyps in his brother; Diabetes in his brother; Heart attack in his father; Heart disease in his brother; Lung cancer in his brother; Pancreatic cancer in his brother; Prostate cancer in his brother.  Allergies  Allergen Reactions  . Levofloxacin Other (See Comments)    Severe headache  . Benicar Hct [Olmesartan Medoxomil-Hctz] Other (See Comments)    reanal  . Celebrex [Celecoxib]     confusion  . Combigan [Brimonidine Tartrate-Timolol] Other (See Comments)    Eye irritation   . Hydralazine   . Metronidazole Other (See Comments)    Headache  . Olmesartan Medoxomil Other (See Comments)    REACTION: progressive renal insufficiency  . Propoxyphene Napsylate [Propoxyphene] Other (See Comments)    With Acetaminophen, severe headache and trouble urinating  . Clarithromycin Rash    Bad Headache  PHYSICAL EXAMINATION: Vital signs: BP 132/80   Pulse 74   Ht 5' 6.75" (1.695 m)   Wt 177 lb (80.3 kg)   BMI 27.93 kg/m   Constitutional: generally well-appearing, no acute distress Psychiatric: alert and oriented x3, cooperative Eyes: extraocular movements intact, anicteric, conjunctiva pink Mouth: oral pharynx moist, no lesions Neck: supple no  lymphadenopathy Cardiovascular: heart regular rate and rhythm, no murmur Lungs: clear to auscultation bilaterally Abdomen: soft, nontender, nondistended, no obvious ascites, no peritoneal signs, normal bowel sounds, no organomegaly Rectal:deferred until colonoscopy Extremities: no clubbing, cyanosis, or lower extremity edema bilaterally Skin: no lesions on visible extremities Neuro: No focal deficits. Cranial nerves intact  ASSESSMENT:  #1. Constipation. Functional. Slow transit. #2. History of multiple and advanced adenomatous colon polyps and family history of colon cancer. Due for surveillance #3. Gen. Medical problems. Stable   PLAN:  #1. Continue with fiber supplementation #2. May use MiraLAX on demand #3. No indication for upper endoscopy. Discussed #4. Surveillance colonoscopy.The nature of the procedure, as well as the risks, benefits, and alternatives were carefully and thoroughly reviewed with the patient. Ample time for discussion and questions allowed. The patient understood, was satisfied, and agreed to proceed.

## 2017-07-20 ENCOUNTER — Other Ambulatory Visit: Payer: Self-pay

## 2017-07-20 ENCOUNTER — Ambulatory Visit (AMBULATORY_SURGERY_CENTER): Payer: Medicare Other | Admitting: Internal Medicine

## 2017-07-20 ENCOUNTER — Encounter: Payer: Self-pay | Admitting: Internal Medicine

## 2017-07-20 VITALS — BP 134/67 | HR 56 | Temp 99.1°F | Resp 10 | Ht 66.75 in | Wt 177.0 lb

## 2017-07-20 DIAGNOSIS — E669 Obesity, unspecified: Secondary | ICD-10-CM | POA: Diagnosis not present

## 2017-07-20 DIAGNOSIS — D125 Benign neoplasm of sigmoid colon: Secondary | ICD-10-CM | POA: Diagnosis not present

## 2017-07-20 DIAGNOSIS — Z86010 Personal history of colonic polyps: Secondary | ICD-10-CM

## 2017-07-20 DIAGNOSIS — K5900 Constipation, unspecified: Secondary | ICD-10-CM | POA: Diagnosis not present

## 2017-07-20 DIAGNOSIS — Z800 Family history of malignant neoplasm of digestive organs: Secondary | ICD-10-CM

## 2017-07-20 DIAGNOSIS — I10 Essential (primary) hypertension: Secondary | ICD-10-CM | POA: Diagnosis not present

## 2017-07-20 MED ORDER — SODIUM CHLORIDE 0.9 % IV SOLN: 500.0000 mL | Freq: Once | INTRAVENOUS | Status: DC

## 2017-07-20 NOTE — Op Note (Signed)
[facility] Patient Name: Stephen [name] Procedure Date: 07/20/2017 2:20 PM MRN: [id number] Endoscopist: Docia Chuck. Henrene Pastor , MD Age: 82 Referring MD:  Date of Birth: 12/04/1935 Gender: Male Account #: [id number] Procedure:                Colonoscopy, with cold snare polypectomy x 2 Indications:              High risk colon cancer surveillance: Personal                            history of adenoma (10 mm or greater in size), High                            risk colon cancer surveillance: Personal history of                            multiple (3 or more) adenomas. Brother with a                            history of colon cancer. Previous examinations                            2009, 2010, 2011, 2013 Medicines:                Monitored Anesthesia Care Procedure:                Pre-Anesthesia Assessment:                           - Prior to the procedure, a History and Physical                            was performed, and patient medications and                            allergies were reviewed. The patient's tolerance of                            previous anesthesia was also reviewed. The risks                            and benefits of the procedure and the sedation                            options and risks were discussed with the patient.                            All questions were answered, and informed consent                            was obtained. Prior Anticoagulants: The patient has                            taken no previous anticoagulant or antiplatelet  agents. ASA Grade Assessment: II - A patient with                            mild systemic disease. After reviewing the risks                            and benefits, the patient was deemed in                            satisfactory condition to undergo the procedure.                           After obtaining informed consent, the colonoscope                            was passed  under direct vision. Throughout the                            procedure, the patient's blood pressure, pulse, and                            oxygen saturations were monitored continuously. The                            Colonoscope was introduced through the anus and                            advanced to the the cecum, identified by                            appendiceal orifice and ileocecal valve. The                            ileocecal valve, appendiceal orifice, and rectum                            were photographed. The quality of the bowel                            preparation was excellent. The colonoscopy was                            performed without difficulty. The patient tolerated                            the procedure well. The bowel preparation used was                            SUPREP. Scope In: 2:29:55 PM Scope Out: 2:45:57 PM Scope Withdrawal Time: 0 hours 12 minutes 6 seconds  Total Procedure Duration: 0 hours 16 minutes 2 seconds  Findings:                 Two polyps were found in the sigmoid colon. The  polyps were 2 to 3 mm in size. These polyps were                            removed with a cold snare. Resection and retrieval                            were complete.                           Multiple diverticula were found in the sigmoid                            colon.                           Internal hemorrhoids were found during retroflexion.                           The exam was otherwise without abnormality on                            direct and retroflexion views. Complications:            No immediate complications. Estimated blood loss:                            None. Estimated Blood Loss:     Estimated blood loss: none. Impression:               - Two 2 to 3 mm polyps in the sigmoid colon,                            removed with a cold snare. Resected and retrieved.                           - Diverticulosis in  the sigmoid colon.                           - Internal hemorrhoids.                           - The examination was otherwise normal on direct                            and retroflexion views. Recommendation:           - Repeat colonoscopy is not recommended for                            surveillance.                           - Patient has a contact number available for                            emergencies. The signs and symptoms of potential  delayed complications were discussed with the                            patient. Return to normal activities tomorrow.                            Written discharge instructions were provided to the                            patient.                           - Resume previous diet.                           - Continue present medications.                           - Await pathology results. Docia Chuck. Henrene Pastor, MD 07/20/2017 2:50:22 PM This report has been signed electronically.

## 2017-07-20 NOTE — Patient Instructions (Signed)
   Information on polyps,diverticulosis,& hemorrhoids given to you today   Await pathology results on polyps removed   YOU HAD AN ENDOSCOPIC PROCEDURE TODAY AT THE [facility]:   Refer to the procedure report that was given to you for any specific questions about what was found during the examination.  If the procedure report does not answer your questions, please call your gastroenterologist to clarify.  If you requested that your care partner not be given the details of your procedure findings, then the procedure report has been included in a sealed envelope for you to review at your convenience later.  YOU SHOULD EXPECT: Some feelings of bloating in the abdomen. Passage of more gas than usual.  Walking can help get rid of the air that was put into your GI tract during the procedure and reduce the bloating. If you had a lower endoscopy (such as a colonoscopy or flexible sigmoidoscopy) you may notice spotting of blood in your stool or on the toilet paper. If you underwent a bowel prep for your procedure, you may not have a normal bowel movement for a few days.  Please Note:  You might notice some irritation and congestion in your nose or some drainage.  This is from the oxygen used during your procedure.  There is no need for concern and it should clear up in a day or so.  SYMPTOMS TO REPORT IMMEDIATELY:   Following lower endoscopy (colonoscopy or flexible sigmoidoscopy):  Excessive amounts of blood in the stool  Significant tenderness or worsening of abdominal pains  Swelling of the abdomen that is new, acute  Fever of 100F or higher   For urgent or emergent issues, a gastroenterologist can be reached at any hour by calling [phone].   DIET:  We do recommend a small meal at first, but then you may proceed to your regular diet.  Drink plenty of fluids but you should avoid alcoholic beverages for 24 hours.  ACTIVITY:  You should plan to take it easy for the rest of  today and you should NOT DRIVE or use heavy machinery until tomorrow (because of the sedation medicines used during the test).    FOLLOW UP: Our staff will call the number listed on your records the next business day following your procedure to check on you and address any questions or concerns that you may have regarding the information given to you following your procedure. If we do not reach you, we will leave a message.  However, if you are feeling well and you are not experiencing any problems, there is no need to return our call.  We will assume that you have returned to your regular daily activities without incident.  If any biopsies were taken you will be contacted by phone or by letter within the next 1-3 weeks.  Please call us at [phone] if you have not heard about the biopsies in 3 weeks.    SIGNATURES/CONFIDENTIALITY: You and/or your care partner have signed paperwork which will be entered into your electronic medical record.  These signatures attest to the fact that that the information above on your After Visit Summary has been reviewed and is understood.  Full responsibility of the confidentiality of this discharge information lies with you and/or your care-partner. 

## 2017-07-20 NOTE — Progress Notes (Signed)
Called to room to assist during endoscopic procedure.  Patient ID and intended procedure confirmed with present staff. Received instructions for my participation in the procedure from the performing physician.  

## 2017-07-20 NOTE — Progress Notes (Signed)
Report to PACU, RN, vss, BBS= Clear.  

## 2017-07-21 ENCOUNTER — Telehealth: Payer: Self-pay | Admitting: *Deleted

## 2017-07-21 NOTE — Telephone Encounter (Signed)
   Follow up Call-  Call back number 07/20/2017  Post procedure Call Back phone  # [phone]  or [phone]  Permission to leave phone message Yes  Some recent data might be hidden     Patient questions:  Do you have a fever, pain , or abdominal swelling? No. Pain Score  0 *  Have you tolerated food without any problems? Yes.    Have you been able to return to your normal activities? Yes.    Do you have any questions about your discharge instructions: Diet   No. Medications  No. Follow up visit  No.  Do you have questions or concerns about your Care? No.  Actions: * If pain score is 4 or above: No action needed, pain <4.

## 2017-07-26 ENCOUNTER — Encounter: Payer: Self-pay | Admitting: Internal Medicine

## 2017-10-18 DIAGNOSIS — R946 Abnormal results of thyroid function studies: Secondary | ICD-10-CM | POA: Diagnosis not present

## 2017-10-18 DIAGNOSIS — E782 Mixed hyperlipidemia: Secondary | ICD-10-CM | POA: Diagnosis not present

## 2017-10-18 DIAGNOSIS — E119 Type 2 diabetes mellitus without complications: Secondary | ICD-10-CM | POA: Diagnosis not present

## 2017-10-18 DIAGNOSIS — I10 Essential (primary) hypertension: Secondary | ICD-10-CM | POA: Diagnosis not present

## 2017-10-20 DIAGNOSIS — Z6826 Body mass index (BMI) 26.0-26.9, adult: Secondary | ICD-10-CM | POA: Diagnosis not present

## 2017-10-20 DIAGNOSIS — E782 Mixed hyperlipidemia: Secondary | ICD-10-CM | POA: Diagnosis not present

## 2017-10-20 DIAGNOSIS — I10 Essential (primary) hypertension: Secondary | ICD-10-CM | POA: Diagnosis not present

## 2017-10-20 DIAGNOSIS — R944 Abnormal results of kidney function studies: Secondary | ICD-10-CM | POA: Diagnosis not present

## 2017-10-20 DIAGNOSIS — Z0000 Encounter for general adult medical examination without abnormal findings: Secondary | ICD-10-CM | POA: Diagnosis not present

## 2017-10-20 DIAGNOSIS — Z9181 History of falling: Secondary | ICD-10-CM | POA: Diagnosis not present

## 2017-10-20 DIAGNOSIS — J309 Allergic rhinitis, unspecified: Secondary | ICD-10-CM | POA: Diagnosis not present

## 2017-10-20 DIAGNOSIS — R972 Elevated prostate specific antigen [PSA]: Secondary | ICD-10-CM | POA: Diagnosis not present

## 2017-10-20 DIAGNOSIS — E119 Type 2 diabetes mellitus without complications: Secondary | ICD-10-CM | POA: Diagnosis not present

## 2017-10-20 DIAGNOSIS — R946 Abnormal results of thyroid function studies: Secondary | ICD-10-CM | POA: Diagnosis not present

## 2017-11-14 DIAGNOSIS — H401122 Primary open-angle glaucoma, left eye, moderate stage: Secondary | ICD-10-CM | POA: Diagnosis not present

## 2017-11-14 DIAGNOSIS — H401113 Primary open-angle glaucoma, right eye, severe stage: Secondary | ICD-10-CM | POA: Diagnosis not present

## 2018-02-20 DIAGNOSIS — D1801 Hemangioma of skin and subcutaneous tissue: Secondary | ICD-10-CM | POA: Diagnosis not present

## 2018-02-20 DIAGNOSIS — D2272 Melanocytic nevi of left lower limb, including hip: Secondary | ICD-10-CM | POA: Diagnosis not present

## 2018-02-20 DIAGNOSIS — L821 Other seborrheic keratosis: Secondary | ICD-10-CM | POA: Diagnosis not present

## 2018-02-20 DIAGNOSIS — D225 Melanocytic nevi of trunk: Secondary | ICD-10-CM | POA: Diagnosis not present

## 2018-02-20 DIAGNOSIS — Z85828 Personal history of other malignant neoplasm of skin: Secondary | ICD-10-CM | POA: Diagnosis not present

## 2018-02-20 DIAGNOSIS — D2271 Melanocytic nevi of right lower limb, including hip: Secondary | ICD-10-CM | POA: Diagnosis not present

## 2018-02-20 DIAGNOSIS — L820 Inflamed seborrheic keratosis: Secondary | ICD-10-CM | POA: Diagnosis not present

## 2018-02-20 DIAGNOSIS — L430 Hypertrophic lichen planus: Secondary | ICD-10-CM | POA: Diagnosis not present

## 2018-02-20 DIAGNOSIS — D485 Neoplasm of uncertain behavior of skin: Secondary | ICD-10-CM | POA: Diagnosis not present

## 2018-02-22 DIAGNOSIS — H401122 Primary open-angle glaucoma, left eye, moderate stage: Secondary | ICD-10-CM | POA: Diagnosis not present

## 2018-02-22 DIAGNOSIS — H401113 Primary open-angle glaucoma, right eye, severe stage: Secondary | ICD-10-CM | POA: Diagnosis not present

## 2018-04-30 DIAGNOSIS — E1169 Type 2 diabetes mellitus with other specified complication: Secondary | ICD-10-CM | POA: Diagnosis not present

## 2018-04-30 DIAGNOSIS — I10 Essential (primary) hypertension: Secondary | ICD-10-CM | POA: Diagnosis not present

## 2018-04-30 DIAGNOSIS — E782 Mixed hyperlipidemia: Secondary | ICD-10-CM | POA: Diagnosis not present

## 2018-04-30 DIAGNOSIS — J309 Allergic rhinitis, unspecified: Secondary | ICD-10-CM | POA: Diagnosis not present

## 2018-05-31 DIAGNOSIS — H401113 Primary open-angle glaucoma, right eye, severe stage: Secondary | ICD-10-CM | POA: Diagnosis not present

## 2018-05-31 DIAGNOSIS — H401122 Primary open-angle glaucoma, left eye, moderate stage: Secondary | ICD-10-CM | POA: Diagnosis not present

## 2018-08-20 DIAGNOSIS — E782 Mixed hyperlipidemia: Secondary | ICD-10-CM | POA: Diagnosis not present

## 2018-08-20 DIAGNOSIS — R946 Abnormal results of thyroid function studies: Secondary | ICD-10-CM | POA: Diagnosis not present

## 2018-08-20 DIAGNOSIS — Z125 Encounter for screening for malignant neoplasm of prostate: Secondary | ICD-10-CM | POA: Diagnosis not present

## 2018-08-20 DIAGNOSIS — J069 Acute upper respiratory infection, unspecified: Secondary | ICD-10-CM | POA: Diagnosis not present

## 2018-08-20 DIAGNOSIS — I10 Essential (primary) hypertension: Secondary | ICD-10-CM | POA: Diagnosis not present

## 2018-08-20 DIAGNOSIS — R944 Abnormal results of kidney function studies: Secondary | ICD-10-CM | POA: Diagnosis not present

## 2018-08-20 DIAGNOSIS — R972 Elevated prostate specific antigen [PSA]: Secondary | ICD-10-CM | POA: Diagnosis not present

## 2018-08-20 DIAGNOSIS — R7301 Impaired fasting glucose: Secondary | ICD-10-CM | POA: Diagnosis not present

## 2018-08-23 DIAGNOSIS — I10 Essential (primary) hypertension: Secondary | ICD-10-CM | POA: Diagnosis not present

## 2018-08-23 DIAGNOSIS — R972 Elevated prostate specific antigen [PSA]: Secondary | ICD-10-CM | POA: Diagnosis not present

## 2018-08-23 DIAGNOSIS — H4010X0 Unspecified open-angle glaucoma, stage unspecified: Secondary | ICD-10-CM | POA: Diagnosis not present

## 2018-08-23 DIAGNOSIS — K635 Polyp of colon: Secondary | ICD-10-CM | POA: Diagnosis not present

## 2018-08-23 DIAGNOSIS — J309 Allergic rhinitis, unspecified: Secondary | ICD-10-CM | POA: Diagnosis not present

## 2018-08-23 DIAGNOSIS — E119 Type 2 diabetes mellitus without complications: Secondary | ICD-10-CM | POA: Diagnosis not present

## 2018-08-23 DIAGNOSIS — R946 Abnormal results of thyroid function studies: Secondary | ICD-10-CM | POA: Diagnosis not present

## 2018-08-23 DIAGNOSIS — R944 Abnormal results of kidney function studies: Secondary | ICD-10-CM | POA: Diagnosis not present

## 2018-08-23 DIAGNOSIS — E782 Mixed hyperlipidemia: Secondary | ICD-10-CM | POA: Diagnosis not present

## 2018-08-27 ENCOUNTER — Other Ambulatory Visit: Payer: Self-pay

## 2018-11-02 DIAGNOSIS — H401121 Primary open-angle glaucoma, left eye, mild stage: Secondary | ICD-10-CM | POA: Diagnosis not present

## 2018-11-02 DIAGNOSIS — H401113 Primary open-angle glaucoma, right eye, severe stage: Secondary | ICD-10-CM | POA: Diagnosis not present

## 2018-12-17 DIAGNOSIS — I10 Essential (primary) hypertension: Secondary | ICD-10-CM | POA: Diagnosis not present

## 2018-12-17 DIAGNOSIS — R946 Abnormal results of thyroid function studies: Secondary | ICD-10-CM | POA: Diagnosis not present

## 2018-12-17 DIAGNOSIS — R944 Abnormal results of kidney function studies: Secondary | ICD-10-CM | POA: Diagnosis not present

## 2018-12-17 DIAGNOSIS — E782 Mixed hyperlipidemia: Secondary | ICD-10-CM | POA: Diagnosis not present

## 2018-12-17 DIAGNOSIS — E119 Type 2 diabetes mellitus without complications: Secondary | ICD-10-CM | POA: Diagnosis not present

## 2018-12-17 DIAGNOSIS — J309 Allergic rhinitis, unspecified: Secondary | ICD-10-CM | POA: Diagnosis not present

## 2018-12-27 DIAGNOSIS — Z1159 Encounter for screening for other viral diseases: Secondary | ICD-10-CM | POA: Diagnosis not present

## 2019-02-14 DIAGNOSIS — I10 Essential (primary) hypertension: Secondary | ICD-10-CM | POA: Diagnosis not present

## 2019-02-14 DIAGNOSIS — E119 Type 2 diabetes mellitus without complications: Secondary | ICD-10-CM | POA: Diagnosis not present

## 2019-02-14 DIAGNOSIS — E782 Mixed hyperlipidemia: Secondary | ICD-10-CM | POA: Diagnosis not present

## 2019-02-14 DIAGNOSIS — E1169 Type 2 diabetes mellitus with other specified complication: Secondary | ICD-10-CM | POA: Diagnosis not present

## 2019-02-14 DIAGNOSIS — R946 Abnormal results of thyroid function studies: Secondary | ICD-10-CM | POA: Diagnosis not present

## 2019-02-19 DIAGNOSIS — H401121 Primary open-angle glaucoma, left eye, mild stage: Secondary | ICD-10-CM | POA: Diagnosis not present

## 2019-02-19 DIAGNOSIS — H401113 Primary open-angle glaucoma, right eye, severe stage: Secondary | ICD-10-CM | POA: Diagnosis not present

## 2019-02-20 DIAGNOSIS — R944 Abnormal results of kidney function studies: Secondary | ICD-10-CM | POA: Diagnosis not present

## 2019-02-20 DIAGNOSIS — R972 Elevated prostate specific antigen [PSA]: Secondary | ICD-10-CM | POA: Diagnosis not present

## 2019-02-20 DIAGNOSIS — M5441 Lumbago with sciatica, right side: Secondary | ICD-10-CM | POA: Diagnosis not present

## 2019-02-20 DIAGNOSIS — J309 Allergic rhinitis, unspecified: Secondary | ICD-10-CM | POA: Diagnosis not present

## 2019-02-20 DIAGNOSIS — I10 Essential (primary) hypertension: Secondary | ICD-10-CM | POA: Diagnosis not present

## 2019-02-20 DIAGNOSIS — K635 Polyp of colon: Secondary | ICD-10-CM | POA: Diagnosis not present

## 2019-02-20 DIAGNOSIS — E119 Type 2 diabetes mellitus without complications: Secondary | ICD-10-CM | POA: Diagnosis not present

## 2019-02-20 DIAGNOSIS — R946 Abnormal results of thyroid function studies: Secondary | ICD-10-CM | POA: Diagnosis not present

## 2019-02-20 DIAGNOSIS — H4010X0 Unspecified open-angle glaucoma, stage unspecified: Secondary | ICD-10-CM | POA: Diagnosis not present

## 2019-02-20 DIAGNOSIS — E782 Mixed hyperlipidemia: Secondary | ICD-10-CM | POA: Diagnosis not present

## 2019-02-25 DIAGNOSIS — D225 Melanocytic nevi of trunk: Secondary | ICD-10-CM | POA: Diagnosis not present

## 2019-02-25 DIAGNOSIS — L812 Freckles: Secondary | ICD-10-CM | POA: Diagnosis not present

## 2019-02-25 DIAGNOSIS — Z85828 Personal history of other malignant neoplasm of skin: Secondary | ICD-10-CM | POA: Diagnosis not present

## 2019-02-25 DIAGNOSIS — D1801 Hemangioma of skin and subcutaneous tissue: Secondary | ICD-10-CM | POA: Diagnosis not present

## 2019-02-25 DIAGNOSIS — L821 Other seborrheic keratosis: Secondary | ICD-10-CM | POA: Diagnosis not present

## 2019-03-05 DIAGNOSIS — E119 Type 2 diabetes mellitus without complications: Secondary | ICD-10-CM | POA: Diagnosis not present

## 2019-03-05 DIAGNOSIS — J309 Allergic rhinitis, unspecified: Secondary | ICD-10-CM | POA: Diagnosis not present

## 2019-03-05 DIAGNOSIS — I10 Essential (primary) hypertension: Secondary | ICD-10-CM | POA: Diagnosis not present

## 2019-03-05 DIAGNOSIS — E782 Mixed hyperlipidemia: Secondary | ICD-10-CM | POA: Diagnosis not present

## 2019-03-05 DIAGNOSIS — R946 Abnormal results of thyroid function studies: Secondary | ICD-10-CM | POA: Diagnosis not present

## 2019-03-05 DIAGNOSIS — R944 Abnormal results of kidney function studies: Secondary | ICD-10-CM | POA: Diagnosis not present

## 2019-04-16 DIAGNOSIS — H401113 Primary open-angle glaucoma, right eye, severe stage: Secondary | ICD-10-CM | POA: Diagnosis not present

## 2019-04-16 DIAGNOSIS — H401121 Primary open-angle glaucoma, left eye, mild stage: Secondary | ICD-10-CM | POA: Diagnosis not present

## 2019-08-13 DIAGNOSIS — Z961 Presence of intraocular lens: Secondary | ICD-10-CM | POA: Diagnosis not present

## 2019-08-13 DIAGNOSIS — H401121 Primary open-angle glaucoma, left eye, mild stage: Secondary | ICD-10-CM | POA: Diagnosis not present

## 2019-08-13 DIAGNOSIS — H401113 Primary open-angle glaucoma, right eye, severe stage: Secondary | ICD-10-CM | POA: Diagnosis not present

## 2019-08-20 DIAGNOSIS — E1169 Type 2 diabetes mellitus with other specified complication: Secondary | ICD-10-CM | POA: Diagnosis not present

## 2019-08-20 DIAGNOSIS — R972 Elevated prostate specific antigen [PSA]: Secondary | ICD-10-CM | POA: Diagnosis not present

## 2019-08-20 DIAGNOSIS — E119 Type 2 diabetes mellitus without complications: Secondary | ICD-10-CM | POA: Diagnosis not present

## 2019-08-20 DIAGNOSIS — E782 Mixed hyperlipidemia: Secondary | ICD-10-CM | POA: Diagnosis not present

## 2019-08-20 DIAGNOSIS — I10 Essential (primary) hypertension: Secondary | ICD-10-CM | POA: Diagnosis not present

## 2019-08-20 DIAGNOSIS — R946 Abnormal results of thyroid function studies: Secondary | ICD-10-CM | POA: Diagnosis not present

## 2019-08-22 DIAGNOSIS — R972 Elevated prostate specific antigen [PSA]: Secondary | ICD-10-CM | POA: Diagnosis not present

## 2019-08-22 DIAGNOSIS — E782 Mixed hyperlipidemia: Secondary | ICD-10-CM | POA: Diagnosis not present

## 2019-08-22 DIAGNOSIS — I10 Essential (primary) hypertension: Secondary | ICD-10-CM | POA: Diagnosis not present

## 2019-08-22 DIAGNOSIS — J309 Allergic rhinitis, unspecified: Secondary | ICD-10-CM | POA: Diagnosis not present

## 2019-08-22 DIAGNOSIS — M5441 Lumbago with sciatica, right side: Secondary | ICD-10-CM | POA: Diagnosis not present

## 2019-08-22 DIAGNOSIS — E1165 Type 2 diabetes mellitus with hyperglycemia: Secondary | ICD-10-CM | POA: Diagnosis not present

## 2019-08-22 DIAGNOSIS — R944 Abnormal results of kidney function studies: Secondary | ICD-10-CM | POA: Diagnosis not present

## 2019-08-22 DIAGNOSIS — K635 Polyp of colon: Secondary | ICD-10-CM | POA: Diagnosis not present

## 2019-08-22 DIAGNOSIS — R946 Abnormal results of thyroid function studies: Secondary | ICD-10-CM | POA: Diagnosis not present

## 2019-08-22 DIAGNOSIS — H4010X0 Unspecified open-angle glaucoma, stage unspecified: Secondary | ICD-10-CM | POA: Diagnosis not present

## 2019-08-22 DIAGNOSIS — Z0001 Encounter for general adult medical examination with abnormal findings: Secondary | ICD-10-CM | POA: Diagnosis not present

## 2019-09-24 DIAGNOSIS — Z961 Presence of intraocular lens: Secondary | ICD-10-CM | POA: Diagnosis not present

## 2019-09-24 DIAGNOSIS — H401113 Primary open-angle glaucoma, right eye, severe stage: Secondary | ICD-10-CM | POA: Diagnosis not present

## 2019-09-24 DIAGNOSIS — H401121 Primary open-angle glaucoma, left eye, mild stage: Secondary | ICD-10-CM | POA: Diagnosis not present

## 2019-10-06 ENCOUNTER — Observation Stay (HOSPITAL_COMMUNITY): Payer: Medicare Other

## 2019-10-06 ENCOUNTER — Encounter (HOSPITAL_COMMUNITY): Payer: Self-pay | Admitting: Emergency Medicine

## 2019-10-06 ENCOUNTER — Emergency Department (HOSPITAL_COMMUNITY): Payer: Medicare Other

## 2019-10-06 ENCOUNTER — Observation Stay (HOSPITAL_COMMUNITY)
Admission: EM | Admit: 2019-10-06 | Discharge: 2019-10-06 | Disposition: A | Discharge status: Home / Self Care | Payer: Medicare Other | Attending: Family Medicine | Admitting: Family Medicine

## 2019-10-06 ENCOUNTER — Other Ambulatory Visit: Payer: Self-pay

## 2019-10-06 DIAGNOSIS — Z7982 Long term (current) use of aspirin: Secondary | ICD-10-CM | POA: Diagnosis not present

## 2019-10-06 DIAGNOSIS — I6521 Occlusion and stenosis of right carotid artery: Secondary | ICD-10-CM

## 2019-10-06 DIAGNOSIS — Z79899 Other long term (current) drug therapy: Secondary | ICD-10-CM | POA: Diagnosis not present

## 2019-10-06 DIAGNOSIS — R479 Unspecified speech disturbances: Secondary | ICD-10-CM | POA: Diagnosis present

## 2019-10-06 DIAGNOSIS — I6529 Occlusion and stenosis of unspecified carotid artery: Secondary | ICD-10-CM | POA: Diagnosis present

## 2019-10-06 DIAGNOSIS — Z7901 Long term (current) use of anticoagulants: Secondary | ICD-10-CM | POA: Diagnosis not present

## 2019-10-06 DIAGNOSIS — Z86010 Personal history of colonic polyps: Secondary | ICD-10-CM | POA: Insufficient documentation

## 2019-10-06 DIAGNOSIS — N189 Chronic kidney disease, unspecified: Secondary | ICD-10-CM | POA: Diagnosis not present

## 2019-10-06 DIAGNOSIS — I6523 Occlusion and stenosis of bilateral carotid arteries: Secondary | ICD-10-CM | POA: Diagnosis not present

## 2019-10-06 DIAGNOSIS — I129 Hypertensive chronic kidney disease with stage 1 through stage 4 chronic kidney disease, or unspecified chronic kidney disease: Secondary | ICD-10-CM | POA: Diagnosis not present

## 2019-10-06 DIAGNOSIS — E119 Type 2 diabetes mellitus without complications: Secondary | ICD-10-CM | POA: Diagnosis not present

## 2019-10-06 DIAGNOSIS — R4781 Slurred speech: Secondary | ICD-10-CM | POA: Diagnosis not present

## 2019-10-06 DIAGNOSIS — G459 Transient cerebral ischemic attack, unspecified: Principal | ICD-10-CM | POA: Diagnosis present

## 2019-10-06 DIAGNOSIS — R4701 Aphasia: Secondary | ICD-10-CM | POA: Diagnosis present

## 2019-10-06 DIAGNOSIS — I10 Essential (primary) hypertension: Secondary | ICD-10-CM | POA: Diagnosis not present

## 2019-10-06 LAB — COMPREHENSIVE METABOLIC PANEL
ALT: 31 U/L (ref 0–44)
AST: 30 U/L (ref 15–41)
Albumin: 4.4 g/dL (ref 3.5–5.0)
Alkaline Phosphatase: 72 U/L (ref 38–126)
Anion gap: 9 (ref 5–15)
BUN: 25 mg/dL — ABNORMAL HIGH (ref 8–23)
CO2: 27 mmol/L (ref 22–32)
Calcium: 9.1 mg/dL (ref 8.9–10.3)
Chloride: 99 mmol/L (ref 98–111)
Creatinine, Ser: 1.25 mg/dL — ABNORMAL HIGH (ref 0.61–1.24)
GFR calc Af Amer: >60 mL/min (ref >60)
GFR calc non Af Amer: 53 mL/min — ABNORMAL LOW (ref >60)
Glucose, Bld: 206 mg/dL — ABNORMAL HIGH (ref 70–99)
Potassium: 4.2 mmol/L (ref 3.5–5.1)
Sodium: 135 mmol/L (ref 135–145)
Total Bilirubin: 0.4 mg/dL (ref 0.3–1.2)
Total Protein: 7.3 g/dL (ref 6.5–8.1)

## 2019-10-06 LAB — URINALYSIS, ROUTINE W REFLEX MICROSCOPIC
Bacteria, UA: NONE SEEN
Bilirubin Urine: NEGATIVE
Glucose, UA: NEGATIVE mg/dL
Ketones, ur: NEGATIVE mg/dL
Leukocytes,Ua: NEGATIVE
Nitrite: NEGATIVE
Protein, ur: NEGATIVE mg/dL
Specific Gravity, Urine: 1.01 (ref 1.005–1.030)
pH: 5 (ref 5.0–8.0)

## 2019-10-06 LAB — DIFFERENTIAL
Abs Immature Granulocytes: 0.01 10*3/uL (ref 0.00–0.07)
Basophils Absolute: 0.1 10*3/uL (ref 0.0–0.1)
Basophils Relative: 1 %
Eosinophils Absolute: 0.2 10*3/uL (ref 0.0–0.5)
Eosinophils Relative: 2 %
Immature Granulocytes: 0 %
Lymphocytes Relative: 34 %
Lymphs Abs: 2.9 10*3/uL (ref 0.7–4.0)
Monocytes Absolute: 0.7 10*3/uL (ref 0.1–1.0)
Monocytes Relative: 9 %
Neutro Abs: 4.7 10*3/uL (ref 1.7–7.7)
Neutrophils Relative %: 54 %

## 2019-10-06 LAB — CBC
HCT: 47.6 % (ref 39.0–52.0)
Hemoglobin: 15.8 g/dL (ref 13.0–17.0)
MCH: 32.2 pg (ref 26.0–34.0)
MCHC: 33.2 g/dL (ref 30.0–36.0)
MCV: 97.1 fL (ref 80.0–100.0)
Platelets: 184 10*3/uL (ref 150–400)
RBC: 4.9 MIL/uL (ref 4.22–5.81)
RDW: 12.5 % (ref 11.5–15.5)
WBC: 8.6 10*3/uL (ref 4.0–10.5)
nRBC: 0 % (ref 0.0–0.2)

## 2019-10-06 LAB — PROTIME-INR
INR: 1 (ref 0.8–1.2)
Prothrombin Time: 13.1 seconds (ref 11.4–15.2)

## 2019-10-06 LAB — GLUCOSE, CAPILLARY: Glucose-Capillary: 255 mg/dL — ABNORMAL HIGH (ref 70–99)

## 2019-10-06 LAB — APTT: aPTT: 26 seconds (ref 24–36)

## 2019-10-06 MED ORDER — CLOPIDOGREL BISULFATE 75 MG PO TABS
75.0000 mg | ORAL_TABLET | Freq: Once | ORAL | Status: DC
  Filled 2019-10-06: qty 1

## 2019-10-06 MED ORDER — METOPROLOL TARTRATE 50 MG PO TABS: 50.0000 mg | ORAL_TABLET | Freq: Two times a day (BID) | ORAL | 3 refills | Status: AC

## 2019-10-06 MED ORDER — AMLODIPINE BESYLATE 10 MG PO TABS: 10.0000 mg | ORAL_TABLET | Freq: Every evening | ORAL | 2 refills | Status: AC

## 2019-10-06 MED ORDER — IOHEXOL 350 MG/ML SOLN
75.0000 mL | Freq: Once | INTRAVENOUS | Status: AC | PRN
  Administered 2019-10-06: 75 mL via INTRAVENOUS

## 2019-10-06 MED ORDER — ASPIRIN EC 81 MG PO TBEC: 81.0000 mg | DELAYED_RELEASE_TABLET | Freq: Every day | ORAL | 0 refills | Status: DC

## 2019-10-06 MED ORDER — ROSUVASTATIN CALCIUM 10 MG PO TABS: 10.0000 mg | ORAL_TABLET | Freq: Every day | ORAL | 3 refills | Status: DC

## 2019-10-06 MED ORDER — CLOPIDOGREL BISULFATE 75 MG PO TABS: 75.0000 mg | ORAL_TABLET | Freq: Every day | ORAL | 11 refills | Status: DC

## 2019-10-06 NOTE — Discharge Instructions (Signed)
1) you have been diagnosed with a mini stroke also called a TIA--- you are at risk for having a full-blown stroke... In order to reduce your risk for full-blown stroke you advised to take Please take Aspirin 81 mg daily along with Plavix 75 mg daily for 30 days then after that STOP the aspirin and continue ONLY Plavix 75 mg daily indefinitely--for stroke prevention AND you also advised to increase your Crestor/Rosuvastatin to 10 mg daily AND increase amlodipine to 10 mg daily for better blood pressure control--- this will also help reduce your risk for strokes  2)Please follow-up with Neurologist Dr. Phillips Odor-- Phone: [phone], Address: [address], [place], [place] [address] in 4  weeks for recheck and reevaluation.  Please call to make appointment with him  3) you advised to follow-up to primary care physician for fasting cholesterol/lipid profile test and for medication adjustment within the next week  4) you also advised to follow-up with your primary care physician to get an echocardiogram/ultrasound of your heart  5) your primary care physician or your neurologist Dr. Merlene Laughter--- may decide to put you on a heart/event monitor for up to a month to look for irregular heartbeat which may increase your risk for strokes as well  6) you will be taking aspirin and Plavix which are blood thinners so please Avoid ibuprofen/Advil/Aleve/Motrin/Goody Powders/Naproxen/BC powders/Meloxicam/Diclofenac/Indomethacin and other Nonsteroidal anti-inflammatory medications as these will make you more likely to bleed and can cause stomach ulcers, can also cause Kidney problems.

## 2019-10-06 NOTE — ED Provider Notes (Signed)
[facility] EMERGENCY DEPARTMENT Provider Note   CSN: [id number] Arrival date & time: 10/06/19  1000     History Chief Complaint  Patient presents with  . Aphasia    Stephen [name] is a 84 y.o. male.  HPI   Pt is an 84 y/o male with a h/o allergies, BPH, CKD, DM, diverticulosis, GERD, glaucoma, HLD, HTN, TIA, who presents to the ED today for eval of slurred speech. States that sxs started around 1pm yesterday. He reports slurred speech, generalized weakness, and a headache yesterday (resolved).   Denies aphasia, vision changes, dizziness/lightheadedness, numbness, ataxia. Denies any other systemic sxs.  Additional hx obtained from family at bedside who states speech was significantly slurred yesterday. She states it has improved somewhat today.   Past Medical History:  Diagnosis Date  . Adenomatous colon polyp   . Allergy   . Arthritis   . BPH (benign prostatic hypertrophy)   . Cataract    bil cataracts removed  . Chest pain   . CKD (chronic kidney disease)   . Diabetes mellitus without complication ([place])    controlled with diet  . Diverticulosis   . GERD (gastroesophageal reflux disease)   . Glaucoma   . Helicobacter pylori (H. pylori)   . Hemorrhoids   . Hyperlipidemia   . Hypertension   . Kidney stones   . TIA (transient ischemic attack)    "side affect from medication had symptoms of TIA"  . UTI (lower urinary tract infection)     Patient Active Problem List   Diagnosis Date Noted  . Speech disturbance 10/06/2019  . Carotid artery plaque 07/31/2013  . Amaurosis fugax, left eye 07/02/2013  . TIA (transient ischemic attack) 07/02/2013  . Visual changes 07/02/2013  . HYPERLIPIDEMIA 02/13/2009  . HYPERTENSION, BENIGN 02/13/2009  . TRANSIENT ISCHEMIC ATTACK 02/13/2009  . NEPHROLITHIASIS 02/13/2009  . CHEST PAIN 02/13/2009  . BENIGN PROSTATIC HYPERTROPHY, HX OF 02/13/2009  . ADENOMATOUS COLONIC POLYP 11/15/2007  . PERSONAL HX COLONIC POLYPS 11/15/2007     Past Surgical History:  Procedure Laterality Date  . CATARACT EXTRACTION Bilateral   . COLONOSCOPY    . CYSTOSCOPY WITH STENT PLACEMENT Left 05/01/2012   Procedure: CYSTOSCOPY WITH URETEROSCOPY AND STONE EXTRACTION, LEFT STENT PLACEMENT ;  Surgeon: Malka So, MD;  Location: [facility];  Service: Urology;  Laterality: Left;  STONE EXTRACTION  STENT PLACEMENT    . ESOPHAGOGASTRODUODENOSCOPY ENDOSCOPY    . LITHOTRIPSY         Family History  Problem Relation Age of Onset  . Heart attack Father   . Pancreatic cancer Brother   . Colon polyps Brother   . Lung cancer Brother   . Heart disease Brother   . Colon cancer Brother   . Prostate cancer Brother   . Diabetes Brother        x 3 broderline DM  . Esophageal cancer Neg Hx   . Stomach cancer Neg Hx     Social History   Tobacco Use  . Smoking status: Never Smoker  . Smokeless tobacco: Never Used  Vaping Use  . Vaping Use: Never used  Substance Use Topics  . Alcohol use: No  . Drug use: No    Home Medications Prior to Admission medications   Medication Sig Start Date End Date Taking? Authorizing Provider  Alpha-Lipoic Acid 200 MG CAPS Take 1 capsule by mouth daily.   Yes [provider]  AMBULATORY NON FORMULARY MEDICATION Flaxseed Meal 1/4 cup 3-4 times/week  Yes [provider]  AMBULATORY NON FORMULARY MEDICATION Healthy Prostate  Take 1 tablet by mouth twice daily   Yes [provider]  Ascorbic Acid (VITAMIN C) 500 MG CAPS Take 500 mg by mouth 2 (two) times daily.    Yes [provider]  ASTAXANTHIN PO Take 1 tablet by mouth daily.   Yes [provider]  Berberine Chloride POWD Take 500 mg by mouth 2 (two) times daily with a meal.   Yes [provider]  Cholecalciferol (VITAMIN D3) 5000 UNITS TABS Take 1 tablet by mouth daily.   Yes [provider]  Coenzyme Q10 (CO Q 10) 100 MG CAPS Take 1 capsule by mouth 2 (two) times daily.   Yes [provider]  Cyanocobalamin (VITAMIN B12) 3000 MCG/ML LIQD Place 1 tablet under the tongue daily.   Yes [provider]  Magnesium 500 MG TABS Take 500 mg by mouth daily.    Yes [provider]  Misc Natural Products (BLACK CHERRY CONCENTRATE PO) Take 1 capsule by mouth in the morning and at bedtime.   Yes [provider]  Multiple Vitamin (MULTIVITAMIN WITH MINERALS) TABS tablet Take 1 tablet by mouth daily.   Yes [provider]  niacin (NIASPAN) 1000 MG CR tablet Take 1,000 mg by mouth at bedtime.    Yes [provider]  Omega 3-6-9 Fatty Acids (TRIPLE OMEGA-3-6-9) CAPS Take 1 capsule by mouth 2 (two) times daily.   Yes [provider]  timolol (BETIMOL) 0.5 % ophthalmic solution Place 1 drop into both eyes 2 (two) times daily.    Yes [provider]  vitamin E (VITAMIN E) 400 UNIT capsule Take 400 Units by mouth daily.    Yes [provider]  amLODipine (NORVASC) 10 MG tablet Take 1 tablet (10 mg total) by mouth every evening. 10/06/19   Roxan Hockey, MD  aspirin EC 81 MG tablet Take 1 tablet (81 mg total) by mouth daily with breakfast. 1)Please take Aspirin 81 mg daily along with Plavix 75 mg daily for 30 days then after that STOP the aspirin and continue ONLY Plavix 75 mg daily indefinitely--for stroke prevention 10/06/19 10/05/20  Roxan Hockey, MD  clopidogrel (PLAVIX) 75 MG tablet Take 1 tablet (75 mg total) by mouth daily. 1)Please take Aspirin 81 mg daily along with Plavix 75 mg daily for 30 days then after that STOP the aspirin and continue ONLY Plavix 75 mg daily indefinitely--for stroke prevention 10/06/19 10/05/20  Roxan Hockey, MD  metoprolol tartrate (LOPRESSOR) 50 MG tablet Take 1 tablet (50 mg total) by mouth 2 (two) times daily. 10/06/19   Roxan Hockey, MD  rosuvastatin (CRESTOR) 10 MG tablet Take 1 tablet (10 mg total) by mouth at bedtime. For stroke prevention 10/06/19   Roxan Hockey, MD     Allergies    Levofloxacin, Benicar hct [olmesartan medoxomil-hctz], Celebrex [celecoxib], Combigan [brimonidine tartrate-timolol], Hydralazine, Metronidazole, Propoxyphene napsylate [propoxyphene], and Clarithromycin  Review of Systems   Review of Systems  Constitutional: Negative for chills and fever.  HENT: Negative for ear pain and sore throat.   Eyes: Negative for visual disturbance.  Respiratory: Negative for cough and shortness of breath.   Cardiovascular: Negative for chest pain.  Gastrointestinal: Negative for abdominal pain, constipation, diarrhea, nausea and vomiting.  Genitourinary: Negative for dysuria and hematuria.  Musculoskeletal: Negative for back pain.  Skin: Negative for rash.  Neurological: Positive for speech difficulty, weakness and headaches. Negative for dizziness, syncope, light-headedness and numbness.  All  other systems reviewed and are negative.   Physical Exam Updated Vital Signs BP (!) 167/69   Pulse 60   Temp 98 F (36.7 C) (Oral)   Resp 16   Ht 5\' 8"  (1.727 m)   Wt 78 kg   SpO2 93%   BMI 26.15 kg/m   Physical Exam Vitals and nursing note reviewed.  Constitutional:      Appearance: He is well-developed.  HENT:     Head: Normocephalic and atraumatic.  Eyes:     Conjunctiva/sclera: Conjunctivae normal.  Cardiovascular:     Rate and Rhythm: Normal rate and regular rhythm.     Heart sounds: Normal heart sounds. No murmur heard.   Pulmonary:     Effort: Pulmonary effort is normal. No respiratory distress.     Breath sounds: Normal breath sounds. No wheezing, rhonchi or rales.  Abdominal:     General: Bowel sounds are normal.     Palpations: Abdomen is soft.     Tenderness: There is no abdominal tenderness. There is no guarding or rebound.  Musculoskeletal:     Cervical back: Neck supple.  Skin:    General: Skin is warm and dry.  Neurological:     Mental Status: He is alert.     Comments: Mental Status:  Alert, thought content  appropriate, able to give a coherent history. Speech fluent without evidence of aphasia. Able to follow 2 step commands without difficulty.  Cranial Nerves:  II:  Peripheral visual fields grossly normal, pupils equal, round, reactive to light III,IV, VI: ptosis not present, extra-ocular motions intact bilaterally  V,VII: smile symmetric, facial light touch sensation equal VIII: hearing grossly normal to voice  X: uvula elevates symmetrically  XI: bilateral shoulder shrug symmetric and strong XII: midline tongue extension without fassiculations Motor:  Normal tone. 5/5 strength of BUE and BLE major muscle groups including strong and equal grip strength and dorsiflexion/plantar flexion Sensory: light touch normal in all extremities. DTRs: biceps and achilles 2+ symmetric b/l Cerebellar: normal finger-to-nose with bilateral upper extremities      ED Results / Procedures / Treatments   Labs (all labs ordered are listed, but only abnormal results are displayed) Labs Reviewed  COMPREHENSIVE METABOLIC PANEL - Abnormal; Notable for the following components:      Result Value   Glucose, Bld 206 (*)    BUN 25 (*)    Creatinine, Ser 1.25 (*)    GFR calc non Af Amer 53 (*)    All other components within normal limits  GLUCOSE, CAPILLARY - Abnormal; Notable for the following components:   Glucose-Capillary 255 (*)    All other components within normal limits  URINALYSIS, ROUTINE W REFLEX MICROSCOPIC - Abnormal; Notable for the following components:   Hgb urine dipstick SMALL (*)    All other components within normal limits  SARS CORONAVIRUS 2 BY RT PCR (HOSPITAL ORDER, [facility])  PROTIME-INR  APTT  CBC  DIFFERENTIAL  CBG MONITORING, ED    EKG EKG Interpretation  Date/Time:  Sunday October 06 2019 10:18:59 EDT Ventricular Rate:  62 PR Interval:  218 QRS Duration: 70 QT Interval:  388 QTC Calculation: 393 R Axis:   38 Text Interpretation: Sinus  rhythm with 1st degree A-V block Otherwise normal ECG Confirmed by Virgel Manifold [phone]) on 10/06/2019 12:28:07 PM   Radiology CT ANGIO HEAD W OR WO CONTRAST  Result Date: 10/06/2019 CLINICAL DATA:  Neuro deficit, acute, stroke suspected. Additional provided: Impaired speech that  began yesterday afternoon. EXAM: CT ANGIOGRAPHY HEAD AND NECK TECHNIQUE: Multidetector CT imaging of the head and neck was performed using the standard protocol during bolus administration of intravenous contrast. Multiplanar CT image reconstructions and MIPs were obtained to evaluate the vascular anatomy. Carotid stenosis measurements (when applicable) are obtained utilizing NASCET criteria, using the distal internal carotid diameter as the denominator. CONTRAST:  41mL OMNIPAQUE IOHEXOL 350 MG/ML SOLN COMPARISON:  Same day head CT 10/06/2019.  MRA head 07/02/2013 FINDINGS: CTA NECK FINDINGS Mildly motion degraded examination. Aortic arch: Minimal atherosclerotic plaque within the visualized aortic arch. No hemodynamically significant innominate or proximal subclavian artery stenosis. Right carotid system: CCA and ICA patent within the neck. Soft plaque within the carotid bulb resulting in less than 50% stenosis. Mixed plaque at the distal aspect of the carotid bulb with resultant 40-50% stenosis. Left carotid system: CCA and ICA patent within the neck. Minimal mixed plaque within the carotid bifurcation. Mixed plaque within the carotid bulb with up to 40% stenosis. Vertebral arteries: The vertebral arteries are patent within the neck with the right being dominant. No significant stenosis within the cervical right vertebral artery. Moderate atherosclerotic narrowing at the origin of the non dominant left vertebral artery. Skeleton: No acute bony abnormality or aggressive osseous lesion. Cervical spondylosis with multilevel degenerative osseous fusion. Other neck: No neck mass or cervical lymphadenopathy. Subcentimeter thyroid nodules  not meeting consensus criteria for ultrasound follow-up. Upper chest: No consolidation within the imaged lung apices. Review of the MIP images confirms the above findings CTA HEAD FINDINGS Anterior circulation: The intracranial internal carotid arteries are patent. Calcified plaque within both vessels with no more than mild stenosis. The M1 middle cerebral arteries are patent without significant stenosis. No M2 proximal branch occlusion or high-grade proximal stenosis is identified. The anterior cerebral arteries are patent. Moderate focal stenosis within the A2 left anterior cerebral artery (series 10, image 23). No intracranial aneurysm is identified. Posterior circulation: The intracranial vertebral arteries are patent. The basilar artery is patent. Predominantly fetal origin bilateral posterior cerebral arteries. The posterior cerebral arteries are patent bilaterally. Moderate/severe stenosis within the P2 right posterior cerebral artery (series 9, image 24). Venous sinuses: Within limitations of contrast timing, no convincing thrombus. Anatomic variants: As described Review of the MIP images confirms the above findings IMPRESSION: CTA neck: 1. The bilateral common and internal carotid arteries are patent within the neck. Soft plaque within the proximal right carotid bulb results in less than 50% stenosis. Mixed plaque within the distal right carotid bulb with stenosis of up to 40-50%. Mixed plaque within the left carotid bulb with stenosis of up to 40%. 2. The vertebral arteries are patent within the neck bilaterally. Moderate atherosclerotic narrowing at the origin of the non dominant left vertebral artery. CTA head: 1. No intracranial large vessel occlusion. 2. Moderate focal stenosis within the A2 left anterior cerebral artery. 3. Moderate/severe stenosis within the P2 right posterior cerebral artery. Electronically Signed   By: Kellie Simmering DO   On: 10/06/2019 16:04   CT HEAD WO CONTRAST  Result Date:  10/06/2019 CLINICAL DATA:  Suspected stroke, onset of slurred speech yesterday at 1315 hours EXAM: CT HEAD WITHOUT CONTRAST TECHNIQUE: Contiguous axial images were obtained from the base of the skull through the vertex without intravenous contrast. Sagittal and coronal MPR images reconstructed from axial data set. COMPARISON:  07/01/2013 FINDINGS: Brain: Normal ventricular morphology. No midline shift or mass effect. Normal appearance of brain parenchyma. No intracranial hemorrhage, mass lesion, evidence of  acute infarction, or extra-axial fluid collection. Vascular: No hyperdense vessels Skull: Intact Sinuses/Orbits: Clear Other: N/A IMPRESSION: Normal exam. Electronically Signed   By: Lavonia Dana M.D.   On: 10/06/2019 10:52   CT ANGIO NECK W OR WO CONTRAST  Result Date: 10/06/2019 CLINICAL DATA:  Neuro deficit, acute, stroke suspected. Additional provided: Impaired speech that began yesterday afternoon. EXAM: CT ANGIOGRAPHY HEAD AND NECK TECHNIQUE: Multidetector CT imaging of the head and neck was performed using the standard protocol during bolus administration of intravenous contrast. Multiplanar CT image reconstructions and MIPs were obtained to evaluate the vascular anatomy. Carotid stenosis measurements (when applicable) are obtained utilizing NASCET criteria, using the distal internal carotid diameter as the denominator. CONTRAST:  47mL OMNIPAQUE IOHEXOL 350 MG/ML SOLN COMPARISON:  Same day head CT 10/06/2019.  MRA head 07/02/2013 FINDINGS: CTA NECK FINDINGS Mildly motion degraded examination. Aortic arch: Minimal atherosclerotic plaque within the visualized aortic arch. No hemodynamically significant innominate or proximal subclavian artery stenosis. Right carotid system: CCA and ICA patent within the neck. Soft plaque within the carotid bulb resulting in less than 50% stenosis. Mixed plaque at the distal aspect of the carotid bulb with resultant 40-50% stenosis. Left carotid system: CCA and ICA patent  within the neck. Minimal mixed plaque within the carotid bifurcation. Mixed plaque within the carotid bulb with up to 40% stenosis. Vertebral arteries: The vertebral arteries are patent within the neck with the right being dominant. No significant stenosis within the cervical right vertebral artery. Moderate atherosclerotic narrowing at the origin of the non dominant left vertebral artery. Skeleton: No acute bony abnormality or aggressive osseous lesion. Cervical spondylosis with multilevel degenerative osseous fusion. Other neck: No neck mass or cervical lymphadenopathy. Subcentimeter thyroid nodules not meeting consensus criteria for ultrasound follow-up. Upper chest: No consolidation within the imaged lung apices. Review of the MIP images confirms the above findings CTA HEAD FINDINGS Anterior circulation: The intracranial internal carotid arteries are patent. Calcified plaque within both vessels with no more than mild stenosis. The M1 middle cerebral arteries are patent without significant stenosis. No M2 proximal branch occlusion or high-grade proximal stenosis is identified. The anterior cerebral arteries are patent. Moderate focal stenosis within the A2 left anterior cerebral artery (series 10, image 23). No intracranial aneurysm is identified. Posterior circulation: The intracranial vertebral arteries are patent. The basilar artery is patent. Predominantly fetal origin bilateral posterior cerebral arteries. The posterior cerebral arteries are patent bilaterally. Moderate/severe stenosis within the P2 right posterior cerebral artery (series 9, image 24). Venous sinuses: Within limitations of contrast timing, no convincing thrombus. Anatomic variants: As described Review of the MIP images confirms the above findings IMPRESSION: CTA neck: 1. The bilateral common and internal carotid arteries are patent within the neck. Soft plaque within the proximal right carotid bulb results in less than 50% stenosis. Mixed  plaque within the distal right carotid bulb with stenosis of up to 40-50%. Mixed plaque within the left carotid bulb with stenosis of up to 40%. 2. The vertebral arteries are patent within the neck bilaterally. Moderate atherosclerotic narrowing at the origin of the non dominant left vertebral artery. CTA head: 1. No intracranial large vessel occlusion. 2. Moderate focal stenosis within the A2 left anterior cerebral artery. 3. Moderate/severe stenosis within the P2 right posterior cerebral artery. Electronically Signed   By: Kellie Simmering DO   On: 10/06/2019 16:04    Procedures Procedures (including critical care time)  Medications Ordered in ED Medications  clopidogrel (PLAVIX) tablet 75 mg (75 mg  Oral Refused 10/06/19 1643)  iohexol (OMNIPAQUE) 350 MG/ML injection 75 mL (75 mLs Intravenous Contrast Given 10/06/19 1530)    ED Course  I have reviewed the triage vital signs and the nursing notes.  Pertinent labs & imaging results that were available during my care of the patient were reviewed by me and considered in my medical decision making (see chart for details).    MDM Rules/Calculators/A&P                          84 year old male with history of TIA presenting for evaluation of slurred speech that started yesterday. No other associated neuro complaints. On my exam patient neuro intact. No evidence of any significant slurred speech or aphasia.  Reviewed/interpreted labs CBC unremarkable CMP with elevated blood glucose, normal bicarb and no elevated anion gap, slightly elevated BUN/creatinine. Prior labs 2 years ago Coags normal UA with hematuria, otherwise wnl  EKG with Sinus rhythm with 1st degree A-V block Otherwise normal ECG  CT HEAD reviewed/interpreted - Normal exam.   1:51 PM CONSULT with Teleneurologist, Dr. Karle Barr, who recommends admission for TIA w/u with MRI brian, ECHO, carotid US.   CONSULT with Dr. Roxan Hockey with hospitalist service who had a long discussion  with patient about admission versus discharge as medical therapy would likely be the same despite MRI results. Decided to have pt undergo CTA head/neck to eval vessels. He will assess pt and further discuss option for admission versus discharge with close f/u with the patient.     Final Clinical Impression(s) / ED Diagnoses Final diagnoses:  TIA (transient ischemic attack)    Rx / DC Orders ED Discharge Orders         Ordered    amLODipine (NORVASC) 10 MG tablet  Every evening        10/06/19 1641    metoprolol tartrate (LOPRESSOR) 50 MG tablet  2 times daily        10/06/19 1641    rosuvastatin (CRESTOR) 10 MG tablet  Daily at bedtime        10/06/19 1641    aspirin EC 81 MG tablet  Daily with breakfast        10/06/19 1641    clopidogrel (PLAVIX) 75 MG tablet  Daily        10/06/19 1641    Increase activity slowly        10/06/19 1641    Diet - low sodium heart healthy        10/06/19 1641    Discharge instructions       Comments: 1) you have been diagnosed with a mini stroke also called a TIA--- you are at risk for having a full-blown stroke... In order to reduce your risk for full-blown stroke you advised to take Please take Aspirin 81 mg daily along with Plavix 75 mg daily for 30 days then after that STOP the aspirin and continue ONLY Plavix 75 mg daily indefinitely--for stroke prevention AND you also advised to increase your Crestor/Rosuvastatin to 10 mg daily AND increase amlodipine to 10 mg daily for better blood pressure control--- this will also help reduce your risk for strokes  2)Please follow-up with Neurologist Dr. Phillips Odor-- Phone: [phone], Address: [address], [place], [place] [address] in 4  weeks for recheck and reevaluation.  Please call to make appointment with him  3) you advised to follow-up to primary care physician for fasting cholesterol/lipid profile test and  for medication adjustment within the next week  4) you also advised to  follow-up with your primary care physician to get an echocardiogram/ultrasound of your heart  5) your primary care physician or your neurologist Dr. Merlene Laughter--- may decide to put you on a heart/event monitor for up to a month to look for irregular heartbeat which may increase your risk for strokes as well  6) you will be taking aspirin and Plavix which are blood thinners so please Avoid ibuprofen/Advil/Aleve/Motrin/Goody Powders/Naproxen/BC powders/Meloxicam/Diclofenac/Indomethacin and other Nonsteroidal anti-inflammatory medications as these will make you more likely to bleed and can cause stomach ulcers, can also cause Kidney problems.   10/06/19 1641    Call MD for:  temperature >100.4        10/06/19 1641    Call MD for:  persistant nausea and vomiting        10/06/19 1641    Call MD for:  severe uncontrolled pain        10/06/19 1641    Call MD for:  difficulty breathing, headache or visual disturbances        10/06/19 1641    Call MD for:  persistant dizziness or light-headedness        10/06/19 1641           Rodney Booze, PA-C 10/06/19 1811    Virgel Manifold, MD 10/07/19 1549

## 2019-10-06 NOTE — Consult Note (Signed)
Patient Demographics:    Stephen [name], is a 84 y.o. male  MRN: [id number]   DOB - 28-Dec-1935  Admit Date - 10/06/2019  Outpatient Primary MD for the patient is Celene Squibb, MD   Assessment & Plan:    Principal Problem:   Speech disturbance Active Problems:   HYPERTENSION, BENIGN   TIA (transient ischemic attack)   Carotid artery plaque    1)Speech Disturbance-- suspect TIA/Ministroke --- Even if his lipid panel is within desired limits, patient should still take crestor for it's Pleiotropic effects (beyond cholesterol lowering benefits) --As per patient and sister-in-law Ms Romie Minus --speech appears to be back to baseline -CT head without acute findings -CTA head and neck without LVO --- there is  Moderate focal stenosis within the A2 left anterior cerebral artery AND  Moderate/severe stenosis within the P2 right posterior cerebral artery. -Antiplatelet therapy adjustments and increasing Crestor dosage as well as recommendation for echo, event monitor and outpatient neurology follow-up as outlined in discharge instructions ----Patient repeatedly tells me and also tells RN at bedside that he does not plan to make any changes to his medications or do any follow-up with neurology or do any outpatient work-up including echo and event/Holter monitor --Patient insist that he has a good friend who is a Herbal medicine specialist--- patient plans to seek advice with regards to herbal remedies rather than making any changes to his medication therapy -  2)HTN--BP is not at goal, amlodipine increased to 10 mg daily, continue metoprolol -Patient states that he does not plan to follow recommendations with regards to medication adjustments  3) noncompliance----patient is adamant that he wants to use herbal remedies, he is very  resistant to any medication adjustments at this time --Patient was also very reluctant to undergo any further neuro work-up -He is requesting discharge home  4) hyperglycemia--- outpatient follow-up with PCP for fasting sugar advised  Discharge Instructions:- 1) you have been diagnosed with a mini stroke also called a TIA--- you are at risk for having a full-blown stroke... In order to reduce your risk for full-blown stroke you advised to take Please take Aspirin 81 mg daily along with Plavix 75 mg daily for 30 days then after that STOP the aspirin and continue ONLY Plavix 75 mg daily indefinitely--for stroke prevention AND you also advised to increase your Crestor/Rosuvastatin to 10 mg daily AND increase amlodipine to 10 mg daily for better blood pressure control--- this will also help reduce your risk for strokes  2)Please follow-up with Neurologist Dr. Phillips Odor-- Phone: [phone], Address: [address], [place], [place] [address] in 4  weeks for recheck and reevaluation.  Please call to make appointment with him  3) you advised to follow-up to primary care physician for fasting cholesterol/lipid profile test and for medication adjustment within the next week  4) you also advised to follow-up with your primary care physician to get an echocardiogram/ultrasound of your heart  5) your primary care physician or your neurologist Dr. Merlene Laughter--- may decide to put you on a heart/event monitor for up to a month to look for irregular heartbeat which may increase your risk for strokes as well  6) you will be taking aspirin and Plavix which are blood thinners so please Avoid ibuprofen/Advil/Aleve/Motrin/Goody Powders/Naproxen/BC powders/Meloxicam/Diclofenac/Indomethacin and other Nonsteroidal anti-inflammatory medications as these will make you more likely to bleed and can cause stomach ulcers, can also cause Kidney problems.    Disposition--discharge home  With History of - Reviewed  by me  Past Medical History:  Diagnosis Date  . Adenomatous colon polyp   . Allergy   . Arthritis   . BPH (benign prostatic hypertrophy)   . Cataract    bil cataracts removed  . Chest pain   . CKD (chronic kidney disease)   . Diabetes mellitus without complication ([place])    controlled with diet  . Diverticulosis   . GERD (gastroesophageal reflux disease)   . Glaucoma   . Helicobacter pylori (H. pylori)   . Hemorrhoids   . Hyperlipidemia   . Hypertension   . Kidney stones   . TIA (transient ischemic attack)    "side affect from medication had symptoms of TIA"  . UTI (lower urinary tract infection)       Past Surgical History:  Procedure Laterality Date  . CATARACT EXTRACTION Bilateral   . COLONOSCOPY    . CYSTOSCOPY WITH STENT PLACEMENT Left 05/01/2012   Procedure: CYSTOSCOPY WITH URETEROSCOPY AND STONE EXTRACTION, LEFT STENT PLACEMENT ;  Surgeon: Malka So, MD;  Location: [facility];  Service: Urology;  Laterality: Left;  STONE EXTRACTION  STENT PLACEMENT    . ESOPHAGOGASTRODUODENOSCOPY ENDOSCOPY    . LITHOTRIPSY        Chief Complaint  Patient presents with  . Aphasia      HPI:    Stephen [name]  is a 84 y.o. male 53 year old with past medical history relevant for prior TIAs, history of amaurosis fugax, history of HTN, HLD, BPH and glaucoma who presents to the ED brought in by sister-in-law with concerns about speech impediment that started around 1 PM on 10/05/2019 --Sister-in-law thinks that her speech impairment persisted for quite a while --Patient did not have any focal extremity weakness or numbness and did not have any other neurologic signs or concerns -Additional history obtained from patient's sister-in-law at bedside -Patient insist that his speech is back to normal, patient sister-in-law concur that pt's speech is now back to normal --Patient denies chest pains palpitations dizziness -No fever no chills no nausea no vomiting -No abdominal pain -No leg  pain/swelling, no pleuritic symptoms -Patient states he had a headache yesterday but headache resolved,  -denies any visual disturbance this time around -In the ED CT head without acute findings -CTA head and neck without LVO -UA is not suggestive of UTI -CMP with hyperglycemia, creatinine is 1.25 -LFTs are not elevated -CBC WNL  --Patient refused further interventions, patient declines further work-up, patient declines adjustments to his medication -Patient states that he wants to try herbal remedies --Patient is discharged from the ED in stable neuro condition    Review of systems:    In addition to the HPI above,   A full Review of  Systems was done, all other systems reviewed are negative except as noted above in HPI , .    Social History:  Reviewed by me    Social History   Tobacco Use  . Smoking status:  Never Smoker  . Smokeless tobacco: Never Used  Substance Use Topics  . Alcohol use: No     Family History :  Reviewed by me    Family History  Problem Relation Age of Onset  . Heart attack Father   . Pancreatic cancer Brother   . Colon polyps Brother   . Lung cancer Brother   . Heart disease Brother   . Colon cancer Brother   . Prostate cancer Brother   . Diabetes Brother        x 3 broderline DM  . Esophageal cancer Neg Hx   . Stomach cancer Neg Hx      Home Medications:   Prior to Admission medications   Medication Sig Start Date End Date Taking? Authorizing Provider  Alpha-Lipoic Acid 200 MG CAPS Take 1 capsule by mouth daily.   Yes [provider]  AMBULATORY NON FORMULARY MEDICATION Flaxseed Meal 1/4 cup 3-4 times/week   Yes [provider]  AMBULATORY NON FORMULARY MEDICATION Healthy Prostate  Take 1 tablet by mouth twice daily   Yes [provider]  Ascorbic Acid (VITAMIN C) 500 MG CAPS Take 500 mg by mouth 2 (two) times daily.    Yes [provider]  ASTAXANTHIN PO Take 1 tablet by mouth daily.   Yes  [provider]  Berberine Chloride POWD Take 500 mg by mouth 2 (two) times daily with a meal.   Yes [provider]  Cholecalciferol (VITAMIN D3) 5000 UNITS TABS Take 1 tablet by mouth daily.   Yes [provider]  Coenzyme Q10 (CO Q 10) 100 MG CAPS Take 1 capsule by mouth 2 (two) times daily.   Yes [provider]  Cyanocobalamin (VITAMIN B12) 3000 MCG/ML LIQD Place 1 tablet under the tongue daily.   Yes [provider]  Magnesium 500 MG TABS Take 500 mg by mouth daily.    Yes [provider]  Misc Natural Products (BLACK CHERRY CONCENTRATE PO) Take 1 capsule by mouth in the morning and at bedtime.   Yes [provider]  Multiple Vitamin (MULTIVITAMIN WITH MINERALS) TABS tablet Take 1 tablet by mouth daily.   Yes [provider]  niacin (NIASPAN) 1000 MG CR tablet Take 1,000 mg by mouth at bedtime.    Yes [provider]  Omega 3-6-9 Fatty Acids (TRIPLE OMEGA-3-6-9) CAPS Take 1 capsule by mouth 2 (two) times daily.   Yes [provider]  timolol (BETIMOL) 0.5 % ophthalmic solution Place 1 drop into both eyes 2 (two) times daily.    Yes [provider]  vitamin E (VITAMIN E) 400 UNIT capsule Take 400 Units by mouth daily.    Yes [provider]  amLODipine (NORVASC) 10 MG tablet Take 1 tablet (10 mg total) by mouth every evening. 10/06/19   Roxan Hockey, MD  aspirin EC 81 MG tablet Take 1 tablet (81 mg total) by mouth daily with breakfast. 1)Please take Aspirin 81 mg daily along with Plavix 75 mg daily for 30 days then after that STOP the aspirin and continue ONLY Plavix 75 mg daily indefinitely--for stroke prevention 10/06/19 10/05/20  Roxan Hockey, MD  clopidogrel (PLAVIX) 75 MG tablet Take 1 tablet (75 mg total) by mouth daily. 1)Please take Aspirin 81 mg daily along with Plavix 75 mg daily for 30 days then after that STOP the aspirin and continue ONLY Plavix 75 mg daily  indefinitely--for stroke prevention 10/06/19 10/05/20  Roxan Hockey, MD  metoprolol tartrate (LOPRESSOR) 50  MG tablet Take 1 tablet (50 mg total) by mouth 2 (two) times daily. 10/06/19   Roxan Hockey, MD  rosuvastatin (CRESTOR) 10 MG tablet Take 1 tablet (10 mg total) by mouth at bedtime. For stroke prevention 10/06/19   Roxan Hockey, MD     Allergies:     Allergies  Allergen Reactions  . Levofloxacin Other (See Comments)    Severe headache  . Benicar Hct [Olmesartan Medoxomil-Hctz] Other (See Comments)    Renal damage  . Celebrex [Celecoxib] Other (See Comments)    confusion  . Combigan [Brimonidine Tartrate-Timolol] Other (See Comments)    Eye irritation   . Hydralazine Other (See Comments)    Unknown   . Metronidazole Other (See Comments)    Headache  . Propoxyphene Napsylate [Propoxyphene] Other (See Comments)    With Acetaminophen, severe headache and trouble urinating  . Clarithromycin Rash    Bad Headache     Physical Exam:   Vitals  Blood pressure (!) 157/68, pulse 74, temperature 98 F (36.7 C), temperature source Oral, resp. rate (!) 36, height 5\' 8"  (1.727 m), weight 78 kg, SpO2 95 %.  Physical Examination: General appearance - alert, well appearing, and in no distress  Mental status - alert, oriented to person, place, and time,  Eyes - sclera anicteric Neck - supple, no JVD elevation , Chest - clear  to auscultation bilaterally, symmetrical air movement,  Heart - S1 and S2 normal, regular  Abdomen - soft, nontender, nondistended, ventral hernia noted  neurological - screening mental status exam normal, neck supple without rigidity, cranial nerves II through XII intact, DTR's normal and symmetric -No speech impairment noted at this time Extremities - no pedal edema noted, intact peripheral pulses  Skin - warm, dry     Data Review:    CBC Recent Labs  Lab 10/06/19 1109  WBC 8.6  HGB 15.8  HCT 47.6  PLT 184  MCV 97.1  MCH 32.2  MCHC 33.2    RDW 12.5  LYMPHSABS 2.9  MONOABS 0.7  EOSABS 0.2  BASOSABS 0.1   ------------------------------------------------------------------------------------------------------------------  Chemistries  Recent Labs  Lab 10/06/19 1109  NA 135  K 4.2  CL 99  CO2 27  GLUCOSE 206*  BUN 25*  CREATININE 1.25*  CALCIUM 9.1  AST 30  ALT 31  ALKPHOS 72  BILITOT 0.4   ------------------------------------------------------------------------------------------------------------------ estimated creatinine clearance is 44.1 mL/min (A) (by C-G formula based on SCr of 1.25 mg/dL (H)). ------------------------------------------------------------------------------------------------------------------ No results for input(s): TSH, T4TOTAL, T3FREE, THYROIDAB in the last 72 hours.  Invalid input(s): FREET3   Coagulation profile Recent Labs  Lab 10/06/19 1109  INR 1.0   ------------------------------------------------------------------------------------------------------------------- No results for input(s): DDIMER in the last 72 hours. -------------------------------------------------------------------------------------------------------------------  Cardiac Enzymes No results for input(s): CKMB, TROPONINI, MYOGLOBIN in the last 168 hours.  Invalid input(s): CK ------------------------------------------------------------------------------------------------------------------ No results found for: BNP   ---------------------------------------------------------------------------------------------------------------  Urinalysis    Component Value Date/Time   COLORURINE YELLOW 10/06/2019 [place] 10/06/2019 1342   LABSPEC 1.010 10/06/2019 1342   PHURINE 5.0 10/06/2019 1342   GLUCOSEU NEGATIVE 10/06/2019 1342   HGBUR SMALL (A) 10/06/2019 1342   BILIRUBINUR NEGATIVE 10/06/2019 1342   KETONESUR NEGATIVE 10/06/2019 1342   PROTEINUR NEGATIVE 10/06/2019 1342   UROBILINOGEN 0.2  05/26/2012 1304   NITRITE NEGATIVE 10/06/2019 1342   LEUKOCYTESUR NEGATIVE 10/06/2019 1342    ----------------------------------------------------------------------------------------------------------------   Imaging Results:    CT ANGIO HEAD W OR WO CONTRAST  Result Date: 10/06/2019  CLINICAL DATA:  Neuro deficit, acute, stroke suspected. Additional provided: Impaired speech that began yesterday afternoon. EXAM: CT ANGIOGRAPHY HEAD AND NECK TECHNIQUE: Multidetector CT imaging of the head and neck was performed using the standard protocol during bolus administration of intravenous contrast. Multiplanar CT image reconstructions and MIPs were obtained to evaluate the vascular anatomy. Carotid stenosis measurements (when applicable) are obtained utilizing NASCET criteria, using the distal internal carotid diameter as the denominator. CONTRAST:  63mL OMNIPAQUE IOHEXOL 350 MG/ML SOLN COMPARISON:  Same day head CT 10/06/2019.  MRA head 07/02/2013 FINDINGS: CTA NECK FINDINGS Mildly motion degraded examination. Aortic arch: Minimal atherosclerotic plaque within the visualized aortic arch. No hemodynamically significant innominate or proximal subclavian artery stenosis. Right carotid system: CCA and ICA patent within the neck. Soft plaque within the carotid bulb resulting in less than 50% stenosis. Mixed plaque at the distal aspect of the carotid bulb with resultant 40-50% stenosis. Left carotid system: CCA and ICA patent within the neck. Minimal mixed plaque within the carotid bifurcation. Mixed plaque within the carotid bulb with up to 40% stenosis. Vertebral arteries: The vertebral arteries are patent within the neck with the right being dominant. No significant stenosis within the cervical right vertebral artery. Moderate atherosclerotic narrowing at the origin of the non dominant left vertebral artery. Skeleton: No acute bony abnormality or aggressive osseous lesion. Cervical spondylosis with multilevel  degenerative osseous fusion. Other neck: No neck mass or cervical lymphadenopathy. Subcentimeter thyroid nodules not meeting consensus criteria for ultrasound follow-up. Upper chest: No consolidation within the imaged lung apices. Review of the MIP images confirms the above findings CTA HEAD FINDINGS Anterior circulation: The intracranial internal carotid arteries are patent. Calcified plaque within both vessels with no more than mild stenosis. The M1 middle cerebral arteries are patent without significant stenosis. No M2 proximal branch occlusion or high-grade proximal stenosis is identified. The anterior cerebral arteries are patent. Moderate focal stenosis within the A2 left anterior cerebral artery (series 10, image 23). No intracranial aneurysm is identified. Posterior circulation: The intracranial vertebral arteries are patent. The basilar artery is patent. Predominantly fetal origin bilateral posterior cerebral arteries. The posterior cerebral arteries are patent bilaterally. Moderate/severe stenosis within the P2 right posterior cerebral artery (series 9, image 24). Venous sinuses: Within limitations of contrast timing, no convincing thrombus. Anatomic variants: As described Review of the MIP images confirms the above findings IMPRESSION: CTA neck: 1. The bilateral common and internal carotid arteries are patent within the neck. Soft plaque within the proximal right carotid bulb results in less than 50% stenosis. Mixed plaque within the distal right carotid bulb with stenosis of up to 40-50%. Mixed plaque within the left carotid bulb with stenosis of up to 40%. 2. The vertebral arteries are patent within the neck bilaterally. Moderate atherosclerotic narrowing at the origin of the non dominant left vertebral artery. CTA head: 1. No intracranial large vessel occlusion. 2. Moderate focal stenosis within the A2 left anterior cerebral artery. 3. Moderate/severe stenosis within the P2 right posterior cerebral  artery. Electronically Signed   By: Kellie Simmering DO   On: 10/06/2019 16:04   CT HEAD WO CONTRAST  Result Date: 10/06/2019 CLINICAL DATA:  Suspected stroke, onset of slurred speech yesterday at 1315 hours EXAM: CT HEAD WITHOUT CONTRAST TECHNIQUE: Contiguous axial images were obtained from the base of the skull through the vertex without intravenous contrast. Sagittal and coronal MPR images reconstructed from axial data set. COMPARISON:  07/01/2013 FINDINGS: Brain: Normal ventricular morphology. No midline shift or mass  effect. Normal appearance of brain parenchyma. No intracranial hemorrhage, mass lesion, evidence of acute infarction, or extra-axial fluid collection. Vascular: No hyperdense vessels Skull: Intact Sinuses/Orbits: Clear Other: N/A IMPRESSION: Normal exam. Electronically Signed   By: Lavonia Dana M.D.   On: 10/06/2019 10:52   CT ANGIO NECK W OR WO CONTRAST  Result Date: 10/06/2019 CLINICAL DATA:  Neuro deficit, acute, stroke suspected. Additional provided: Impaired speech that began yesterday afternoon. EXAM: CT ANGIOGRAPHY HEAD AND NECK TECHNIQUE: Multidetector CT imaging of the head and neck was performed using the standard protocol during bolus administration of intravenous contrast. Multiplanar CT image reconstructions and MIPs were obtained to evaluate the vascular anatomy. Carotid stenosis measurements (when applicable) are obtained utilizing NASCET criteria, using the distal internal carotid diameter as the denominator. CONTRAST:  25mL OMNIPAQUE IOHEXOL 350 MG/ML SOLN COMPARISON:  Same day head CT 10/06/2019.  MRA head 07/02/2013 FINDINGS: CTA NECK FINDINGS Mildly motion degraded examination. Aortic arch: Minimal atherosclerotic plaque within the visualized aortic arch. No hemodynamically significant innominate or proximal subclavian artery stenosis. Right carotid system: CCA and ICA patent within the neck. Soft plaque within the carotid bulb resulting in less than 50% stenosis. Mixed  plaque at the distal aspect of the carotid bulb with resultant 40-50% stenosis. Left carotid system: CCA and ICA patent within the neck. Minimal mixed plaque within the carotid bifurcation. Mixed plaque within the carotid bulb with up to 40% stenosis. Vertebral arteries: The vertebral arteries are patent within the neck with the right being dominant. No significant stenosis within the cervical right vertebral artery. Moderate atherosclerotic narrowing at the origin of the non dominant left vertebral artery. Skeleton: No acute bony abnormality or aggressive osseous lesion. Cervical spondylosis with multilevel degenerative osseous fusion. Other neck: No neck mass or cervical lymphadenopathy. Subcentimeter thyroid nodules not meeting consensus criteria for ultrasound follow-up. Upper chest: No consolidation within the imaged lung apices. Review of the MIP images confirms the above findings CTA HEAD FINDINGS Anterior circulation: The intracranial internal carotid arteries are patent. Calcified plaque within both vessels with no more than mild stenosis. The M1 middle cerebral arteries are patent without significant stenosis. No M2 proximal branch occlusion or high-grade proximal stenosis is identified. The anterior cerebral arteries are patent. Moderate focal stenosis within the A2 left anterior cerebral artery (series 10, image 23). No intracranial aneurysm is identified. Posterior circulation: The intracranial vertebral arteries are patent. The basilar artery is patent. Predominantly fetal origin bilateral posterior cerebral arteries. The posterior cerebral arteries are patent bilaterally. Moderate/severe stenosis within the P2 right posterior cerebral artery (series 9, image 24). Venous sinuses: Within limitations of contrast timing, no convincing thrombus. Anatomic variants: As described Review of the MIP images confirms the above findings IMPRESSION: CTA neck: 1. The bilateral common and internal carotid arteries  are patent within the neck. Soft plaque within the proximal right carotid bulb results in less than 50% stenosis. Mixed plaque within the distal right carotid bulb with stenosis of up to 40-50%. Mixed plaque within the left carotid bulb with stenosis of up to 40%. 2. The vertebral arteries are patent within the neck bilaterally. Moderate atherosclerotic narrowing at the origin of the non dominant left vertebral artery. CTA head: 1. No intracranial large vessel occlusion. 2. Moderate focal stenosis within the A2 left anterior cerebral artery. 3. Moderate/severe stenosis within the P2 right posterior cerebral artery. Electronically Signed   By: Kellie Simmering DO   On: 10/06/2019 16:04    Radiological Exams on Admission: CT ANGIO HEAD  W OR WO CONTRAST  Result Date: 10/06/2019 CLINICAL DATA:  Neuro deficit, acute, stroke suspected. Additional provided: Impaired speech that began yesterday afternoon. EXAM: CT ANGIOGRAPHY HEAD AND NECK TECHNIQUE: Multidetector CT imaging of the head and neck was performed using the standard protocol during bolus administration of intravenous contrast. Multiplanar CT image reconstructions and MIPs were obtained to evaluate the vascular anatomy. Carotid stenosis measurements (when applicable) are obtained utilizing NASCET criteria, using the distal internal carotid diameter as the denominator. CONTRAST:  70mL OMNIPAQUE IOHEXOL 350 MG/ML SOLN COMPARISON:  Same day head CT 10/06/2019.  MRA head 07/02/2013 FINDINGS: CTA NECK FINDINGS Mildly motion degraded examination. Aortic arch: Minimal atherosclerotic plaque within the visualized aortic arch. No hemodynamically significant innominate or proximal subclavian artery stenosis. Right carotid system: CCA and ICA patent within the neck. Soft plaque within the carotid bulb resulting in less than 50% stenosis. Mixed plaque at the distal aspect of the carotid bulb with resultant 40-50% stenosis. Left carotid system: CCA and ICA patent within the  neck. Minimal mixed plaque within the carotid bifurcation. Mixed plaque within the carotid bulb with up to 40% stenosis. Vertebral arteries: The vertebral arteries are patent within the neck with the right being dominant. No significant stenosis within the cervical right vertebral artery. Moderate atherosclerotic narrowing at the origin of the non dominant left vertebral artery. Skeleton: No acute bony abnormality or aggressive osseous lesion. Cervical spondylosis with multilevel degenerative osseous fusion. Other neck: No neck mass or cervical lymphadenopathy. Subcentimeter thyroid nodules not meeting consensus criteria for ultrasound follow-up. Upper chest: No consolidation within the imaged lung apices. Review of the MIP images confirms the above findings CTA HEAD FINDINGS Anterior circulation: The intracranial internal carotid arteries are patent. Calcified plaque within both vessels with no more than mild stenosis. The M1 middle cerebral arteries are patent without significant stenosis. No M2 proximal branch occlusion or high-grade proximal stenosis is identified. The anterior cerebral arteries are patent. Moderate focal stenosis within the A2 left anterior cerebral artery (series 10, image 23). No intracranial aneurysm is identified. Posterior circulation: The intracranial vertebral arteries are patent. The basilar artery is patent. Predominantly fetal origin bilateral posterior cerebral arteries. The posterior cerebral arteries are patent bilaterally. Moderate/severe stenosis within the P2 right posterior cerebral artery (series 9, image 24). Venous sinuses: Within limitations of contrast timing, no convincing thrombus. Anatomic variants: As described Review of the MIP images confirms the above findings IMPRESSION: CTA neck: 1. The bilateral common and internal carotid arteries are patent within the neck. Soft plaque within the proximal right carotid bulb results in less than 50% stenosis. Mixed plaque within  the distal right carotid bulb with stenosis of up to 40-50%. Mixed plaque within the left carotid bulb with stenosis of up to 40%. 2. The vertebral arteries are patent within the neck bilaterally. Moderate atherosclerotic narrowing at the origin of the non dominant left vertebral artery. CTA head: 1. No intracranial large vessel occlusion. 2. Moderate focal stenosis within the A2 left anterior cerebral artery. 3. Moderate/severe stenosis within the P2 right posterior cerebral artery. Electronically Signed   By: Kellie Simmering DO   On: 10/06/2019 16:04   CT HEAD WO CONTRAST  Result Date: 10/06/2019 CLINICAL DATA:  Suspected stroke, onset of slurred speech yesterday at 1315 hours EXAM: CT HEAD WITHOUT CONTRAST TECHNIQUE: Contiguous axial images were obtained from the base of the skull through the vertex without intravenous contrast. Sagittal and coronal MPR images reconstructed from axial data set. COMPARISON:  07/01/2013 FINDINGS: Brain:  Normal ventricular morphology. No midline shift or mass effect. Normal appearance of brain parenchyma. No intracranial hemorrhage, mass lesion, evidence of acute infarction, or extra-axial fluid collection. Vascular: No hyperdense vessels Skull: Intact Sinuses/Orbits: Clear Other: N/A IMPRESSION: Normal exam. Electronically Signed   By: Lavonia Dana M.D.   On: 10/06/2019 10:52   CT ANGIO NECK W OR WO CONTRAST  Result Date: 10/06/2019 CLINICAL DATA:  Neuro deficit, acute, stroke suspected. Additional provided: Impaired speech that began yesterday afternoon. EXAM: CT ANGIOGRAPHY HEAD AND NECK TECHNIQUE: Multidetector CT imaging of the head and neck was performed using the standard protocol during bolus administration of intravenous contrast. Multiplanar CT image reconstructions and MIPs were obtained to evaluate the vascular anatomy. Carotid stenosis measurements (when applicable) are obtained utilizing NASCET criteria, using the distal internal carotid diameter as the  denominator. CONTRAST:  79mL OMNIPAQUE IOHEXOL 350 MG/ML SOLN COMPARISON:  Same day head CT 10/06/2019.  MRA head 07/02/2013 FINDINGS: CTA NECK FINDINGS Mildly motion degraded examination. Aortic arch: Minimal atherosclerotic plaque within the visualized aortic arch. No hemodynamically significant innominate or proximal subclavian artery stenosis. Right carotid system: CCA and ICA patent within the neck. Soft plaque within the carotid bulb resulting in less than 50% stenosis. Mixed plaque at the distal aspect of the carotid bulb with resultant 40-50% stenosis. Left carotid system: CCA and ICA patent within the neck. Minimal mixed plaque within the carotid bifurcation. Mixed plaque within the carotid bulb with up to 40% stenosis. Vertebral arteries: The vertebral arteries are patent within the neck with the right being dominant. No significant stenosis within the cervical right vertebral artery. Moderate atherosclerotic narrowing at the origin of the non dominant left vertebral artery. Skeleton: No acute bony abnormality or aggressive osseous lesion. Cervical spondylosis with multilevel degenerative osseous fusion. Other neck: No neck mass or cervical lymphadenopathy. Subcentimeter thyroid nodules not meeting consensus criteria for ultrasound follow-up. Upper chest: No consolidation within the imaged lung apices. Review of the MIP images confirms the above findings CTA HEAD FINDINGS Anterior circulation: The intracranial internal carotid arteries are patent. Calcified plaque within both vessels with no more than mild stenosis. The M1 middle cerebral arteries are patent without significant stenosis. No M2 proximal branch occlusion or high-grade proximal stenosis is identified. The anterior cerebral arteries are patent. Moderate focal stenosis within the A2 left anterior cerebral artery (series 10, image 23). No intracranial aneurysm is identified. Posterior circulation: The intracranial vertebral arteries are patent.  The basilar artery is patent. Predominantly fetal origin bilateral posterior cerebral arteries. The posterior cerebral arteries are patent bilaterally. Moderate/severe stenosis within the P2 right posterior cerebral artery (series 9, image 24). Venous sinuses: Within limitations of contrast timing, no convincing thrombus. Anatomic variants: As described Review of the MIP images confirms the above findings IMPRESSION: CTA neck: 1. The bilateral common and internal carotid arteries are patent within the neck. Soft plaque within the proximal right carotid bulb results in less than 50% stenosis. Mixed plaque within the distal right carotid bulb with stenosis of up to 40-50%. Mixed plaque within the left carotid bulb with stenosis of up to 40%. 2. The vertebral arteries are patent within the neck bilaterally. Moderate atherosclerotic narrowing at the origin of the non dominant left vertebral artery. CTA head: 1. No intracranial large vessel occlusion. 2. Moderate focal stenosis within the A2 left anterior cerebral artery. 3. Moderate/severe stenosis within the P2 right posterior cerebral artery. Electronically Signed   By: Kellie Simmering DO   On: 10/06/2019 16:04  Family Communication: Admission, patients condition and plan of care including tests being ordered have been discussed with the patient and sister in law -Willliam Pettet who indicate understanding and agree with the plan   Code Status - Full Code  Likely DC to  home  Condition   stable  Roxan Hockey M.D on 10/06/2019 at 5:36 PM Go to [email or web address] -  for contact info  [facility] - Office  [phone]

## 2019-10-06 NOTE — ED Triage Notes (Addendum)
Pt brought to ED by sister in law. States yesterday around 1315, pt had slurred speech. That was the first time she saw pt that day. Pt states he had no speech issues when he first woke up, but around 1315 he noticed he was having a difficult time getting his words out. Pt does report that he did not feel well yesterday. Pt hypertensive in triage (184/110). Pt also reports generalized weakness. Speech appear slightly slurred at this time. AOx4. Pt takes 325 ASA daily.

## 2019-10-06 NOTE — ED Notes (Signed)
Pt transported to CT ?

## 2019-10-08 DIAGNOSIS — R972 Elevated prostate specific antigen [PSA]: Secondary | ICD-10-CM | POA: Diagnosis not present

## 2019-10-08 DIAGNOSIS — H4010X0 Unspecified open-angle glaucoma, stage unspecified: Secondary | ICD-10-CM | POA: Diagnosis not present

## 2019-10-08 DIAGNOSIS — J309 Allergic rhinitis, unspecified: Secondary | ICD-10-CM | POA: Diagnosis not present

## 2019-10-08 DIAGNOSIS — I10 Essential (primary) hypertension: Secondary | ICD-10-CM | POA: Diagnosis not present

## 2019-10-08 DIAGNOSIS — Z0001 Encounter for general adult medical examination with abnormal findings: Secondary | ICD-10-CM | POA: Diagnosis not present

## 2019-10-08 DIAGNOSIS — K635 Polyp of colon: Secondary | ICD-10-CM | POA: Diagnosis not present

## 2019-10-08 DIAGNOSIS — E782 Mixed hyperlipidemia: Secondary | ICD-10-CM | POA: Diagnosis not present

## 2019-10-08 DIAGNOSIS — R944 Abnormal results of kidney function studies: Secondary | ICD-10-CM | POA: Diagnosis not present

## 2019-10-08 DIAGNOSIS — E1165 Type 2 diabetes mellitus with hyperglycemia: Secondary | ICD-10-CM | POA: Diagnosis not present

## 2019-10-08 DIAGNOSIS — R946 Abnormal results of thyroid function studies: Secondary | ICD-10-CM | POA: Diagnosis not present

## 2019-10-08 DIAGNOSIS — M5441 Lumbago with sciatica, right side: Secondary | ICD-10-CM | POA: Diagnosis not present

## 2019-10-11 DIAGNOSIS — M5441 Lumbago with sciatica, right side: Secondary | ICD-10-CM | POA: Diagnosis not present

## 2019-10-11 DIAGNOSIS — K635 Polyp of colon: Secondary | ICD-10-CM | POA: Diagnosis not present

## 2019-10-11 DIAGNOSIS — I6529 Occlusion and stenosis of unspecified carotid artery: Secondary | ICD-10-CM | POA: Diagnosis not present

## 2019-10-11 DIAGNOSIS — Z0001 Encounter for general adult medical examination with abnormal findings: Secondary | ICD-10-CM | POA: Diagnosis not present

## 2019-10-11 DIAGNOSIS — I10 Essential (primary) hypertension: Secondary | ICD-10-CM | POA: Diagnosis not present

## 2019-10-11 DIAGNOSIS — H4010X0 Unspecified open-angle glaucoma, stage unspecified: Secondary | ICD-10-CM | POA: Diagnosis not present

## 2019-10-11 DIAGNOSIS — R972 Elevated prostate specific antigen [PSA]: Secondary | ICD-10-CM | POA: Diagnosis not present

## 2019-10-11 DIAGNOSIS — R944 Abnormal results of kidney function studies: Secondary | ICD-10-CM | POA: Diagnosis not present

## 2019-10-11 DIAGNOSIS — E782 Mixed hyperlipidemia: Secondary | ICD-10-CM | POA: Diagnosis not present

## 2019-10-11 DIAGNOSIS — R946 Abnormal results of thyroid function studies: Secondary | ICD-10-CM | POA: Diagnosis not present

## 2019-10-11 DIAGNOSIS — J309 Allergic rhinitis, unspecified: Secondary | ICD-10-CM | POA: Diagnosis not present

## 2019-10-11 DIAGNOSIS — E1165 Type 2 diabetes mellitus with hyperglycemia: Secondary | ICD-10-CM | POA: Diagnosis not present

## 2019-10-14 ENCOUNTER — Encounter: Payer: Self-pay | Admitting: Neurology

## 2019-10-14 ENCOUNTER — Other Ambulatory Visit: Payer: Self-pay | Admitting: Internal Medicine

## 2019-10-14 ENCOUNTER — Other Ambulatory Visit (HOSPITAL_COMMUNITY): Payer: Self-pay | Admitting: Internal Medicine

## 2019-10-14 DIAGNOSIS — Z8673 Personal history of transient ischemic attack (TIA), and cerebral infarction without residual deficits: Secondary | ICD-10-CM

## 2019-10-23 ENCOUNTER — Other Ambulatory Visit: Payer: Self-pay

## 2019-10-23 ENCOUNTER — Ambulatory Visit (HOSPITAL_COMMUNITY)
Admission: RE | Admit: 2019-10-23 | Discharge: 2019-10-23 | Disposition: A | Discharge status: Home / Self Care | Payer: Medicare Other | Source: Ambulatory Visit | Attending: Internal Medicine | Admitting: Internal Medicine

## 2019-10-23 DIAGNOSIS — I6523 Occlusion and stenosis of bilateral carotid arteries: Secondary | ICD-10-CM | POA: Diagnosis not present

## 2019-10-23 DIAGNOSIS — Z8673 Personal history of transient ischemic attack (TIA), and cerebral infarction without residual deficits: Secondary | ICD-10-CM | POA: Insufficient documentation

## 2019-11-05 NOTE — Progress Notes (Signed)
NEUROLOGY CONSULTATION NOTE  SUMMER PARTHASARATHY MRN: [id number] DOB: 1935/06/13  Referring provider: Pablo Lawrence, NP Primary care provider: Edwinna Areola. [place] Crane, MD  Reason for consult:  TIA  HISTORY OF PRESENT ILLNESS: Stephen [name] is an 84 year old left-handed male with HTN, HLD, CKD and diabetes who presents for TIA.  History supplemented by hospital notes.    On 10/05/2019, he developed slurred speech, also had trouble moving his left hand when writing.  He was also unsteady of his feet.  He had a mild headache as well. No associated language deficits, vision loss, dizziness, or numbness.  Headache and unsteadiness resolved by the next day and slurred speech improved but still persisted.  CT head personally reviewed revealed no acute intracranial abnormality.  CTA of head and neck showed moderate focal left A2 stenosis and moderate/severe right P2 stenosis with patent bilateral ICAs with 40-50% stenosis of right carotid bulb and up to 40% stenosis within left carotid bulb.  He was on ASA prior to presentation and discharged on ASA with Plavix for 30 days followed by Plavix alone.  He was advised to follow up with his PCP for an echocardiogram.  Slurred speech persisted for another 3 days after hospital discharge.  Current medications:  ASA 81mg  (plan to stop today), Plavix 75mg , amlodipine, Lopressor, Crestor 10mg , Omega 3-6-9 fatty acids  He had a TIA back in 2010.  He was evaluated by neurologist, Dr. Leonie Man, in 2015 for mild heterogenous plaque at the carotid bifurcations bilaterally.  PAST MEDICAL HISTORY: Past Medical History:  Diagnosis Date  . Adenomatous colon polyp   . Allergy   . Arthritis   . BPH (benign prostatic hypertrophy)   . Cataract    bil cataracts removed  . Chest pain   . CKD (chronic kidney disease)   . Diabetes mellitus without complication ([place])    controlled with diet  . Diverticulosis   . GERD (gastroesophageal reflux disease)   . Glaucoma   .  Helicobacter pylori (H. pylori)   . Hemorrhoids   . Hyperlipidemia   . Hypertension   . Kidney stones   . TIA (transient ischemic attack)    "side affect from medication had symptoms of TIA"  . UTI (lower urinary tract infection)     PAST SURGICAL HISTORY: Past Surgical History:  Procedure Laterality Date  . CATARACT EXTRACTION Bilateral   . COLONOSCOPY    . CYSTOSCOPY WITH STENT PLACEMENT Left 05/01/2012   Procedure: CYSTOSCOPY WITH URETEROSCOPY AND STONE EXTRACTION, LEFT STENT PLACEMENT ;  Surgeon: Malka So, MD;  Location: [facility];  Service: Urology;  Laterality: Left;  STONE EXTRACTION  STENT PLACEMENT    . ESOPHAGOGASTRODUODENOSCOPY ENDOSCOPY    . LITHOTRIPSY      MEDICATIONS: Current Outpatient Medications on File Prior to Visit  Medication Sig Dispense Refill  . Alpha-Lipoic Acid 200 MG CAPS Take 1 capsule by mouth daily.    . AMBULATORY NON FORMULARY MEDICATION Flaxseed Meal 1/4 cup 3-4 times/week    . AMBULATORY NON FORMULARY MEDICATION Healthy Prostate  Take 1 tablet by mouth twice daily    . amLODipine (NORVASC) 10 MG tablet Take 1 tablet (10 mg total) by mouth every evening. 30 tablet 2  . Ascorbic Acid (VITAMIN C) 500 MG CAPS Take 500 mg by mouth 2 (two) times daily.     Marland Kitchen aspirin EC 81 MG tablet Take 1 tablet (81 mg total) by mouth daily with breakfast. 1)Please take Aspirin 81 mg daily along  with Plavix 75 mg daily for 30 days then after that STOP the aspirin and continue ONLY Plavix 75 mg daily indefinitely--for stroke prevention 30 tablet 0  . ASTAXANTHIN PO Take 1 tablet by mouth daily.    . Berberine Chloride POWD Take 500 mg by mouth 2 (two) times daily with a meal.    . Cholecalciferol (VITAMIN D3) 5000 UNITS TABS Take 1 tablet by mouth daily.    . clopidogrel (PLAVIX) 75 MG tablet Take 1 tablet (75 mg total) by mouth daily. 1)Please take Aspirin 81 mg daily along with Plavix 75 mg daily for 30 days then after that STOP the aspirin and continue ONLY Plavix  75 mg daily indefinitely--for stroke prevention 30 tablet 11  . Coenzyme Q10 (CO Q 10) 100 MG CAPS Take 1 capsule by mouth 2 (two) times daily.    . Cyanocobalamin (VITAMIN B12) 3000 MCG/ML LIQD Place 1 tablet under the tongue daily.    . Magnesium 500 MG TABS Take 500 mg by mouth daily.     . metoprolol tartrate (LOPRESSOR) 50 MG tablet Take 1 tablet (50 mg total) by mouth 2 (two) times daily. 60 tablet 3  . Misc Natural Products (BLACK CHERRY CONCENTRATE PO) Take 1 capsule by mouth in the morning and at bedtime.    . Multiple Vitamin (MULTIVITAMIN WITH MINERALS) TABS tablet Take 1 tablet by mouth daily.    . niacin (NIASPAN) 1000 MG CR tablet Take 1,000 mg by mouth at bedtime.     . Omega 3-6-9 Fatty Acids (TRIPLE OMEGA-3-6-9) CAPS Take 1 capsule by mouth 2 (two) times daily.    . rosuvastatin (CRESTOR) 10 MG tablet Take 1 tablet (10 mg total) by mouth at bedtime. For stroke prevention 30 tablet 3  . timolol (BETIMOL) 0.5 % ophthalmic solution Place 1 drop into both eyes 2 (two) times daily.     . vitamin E (VITAMIN E) 400 UNIT capsule Take 400 Units by mouth daily.      Current Facility-Administered Medications on File Prior to Visit  Medication Dose Route Frequency Provider Last Rate Last Admin  . 0.9 %  sodium chloride infusion  500 mL Intravenous Once Irene Shipper, MD        ALLERGIES: Allergies  Allergen Reactions  . Levofloxacin Other (See Comments)    Severe headache  . Benicar Hct [Olmesartan Medoxomil-Hctz] Other (See Comments)    Renal damage  . Celebrex [Celecoxib] Other (See Comments)    confusion  . Combigan [Brimonidine Tartrate-Timolol] Other (See Comments)    Eye irritation   . Hydralazine Other (See Comments)    Unknown   . Metronidazole Other (See Comments)    Headache  . Propoxyphene Napsylate [Propoxyphene] Other (See Comments)    With Acetaminophen, severe headache and trouble urinating  . Clarithromycin Rash    Bad Headache    FAMILY HISTORY: Family  History  Problem Relation Age of Onset  . Heart attack Father   . Pancreatic cancer Brother   . Colon polyps Brother   . Lung cancer Brother   . Heart disease Brother   . Colon cancer Brother   . Prostate cancer Brother   . Diabetes Brother        x 3 broderline DM  . Esophageal cancer Neg Hx   . Stomach cancer Neg Hx     SOCIAL HISTORY: Social History   Socioeconomic History  . Marital status: Widowed    Spouse name: Not on file  . Number of  children: 0  . Years of education: 12th  . Highest education level: Not on file  Occupational History  . Occupation: Retired from [employer] work  Tobacco Use  . Smoking status: Never Smoker  . Smokeless tobacco: Never Used  Vaping Use  . Vaping Use: Never used  Substance and Sexual Activity  . Alcohol use: No  . Drug use: No  . Sexual activity: Never  Other Topics Concern  . Not on file  Social History Narrative   Lives in [place] by himself   Regular exercise   Social Determinants of Health   Financial Resource Strain:   . Difficulty of Paying Living Expenses: Not on file  Food Insecurity:   . Worried About Charity fundraiser in the Last Year: Not on file  . Ran Out of Food in the Last Year: Not on file  Transportation Needs:   . Lack of Transportation (Medical): Not on file  . Lack of Transportation (Non-Medical): Not on file  Physical Activity:   . Days of Exercise per Week: Not on file  . Minutes of Exercise per Session: Not on file  Stress:   . Feeling of Stress : Not on file  Social Connections:   . Frequency of Communication with Friends and Family: Not on file  . Frequency of Social Gatherings with Friends and Family: Not on file  . Attends Religious Services: Not on file  . Active Member of Clubs or Organizations: Not on file  . Attends Archivist Meetings: Not on file  . Marital Status: Not on file  Intimate Partner Violence:   . Fear of Current or Ex-Partner: Not on file  . Emotionally  Abused: Not on file  . Physically Abused: Not on file  . Sexually Abused: Not on file   PHYSICAL EXAM: Blood pressure (!) 187/74, pulse (!) 52, height 5\' 8"  (1.727 m), weight 165 lb (74.8 kg), SpO2 98 %. General: No acute distress.  Patient appears well-groomed.   Head:  Normocephalic/atraumatic Eyes:  fundi examined but not visualized Neck: supple, no paraspinal tenderness, full range of motion Back: No paraspinal tenderness Heart: regular rate and rhythm Lungs: Clear to auscultation bilaterally. Vascular: No carotid bruits. Neurological Exam: Mental status: alert and oriented to person, place, and time, recent and remote memory intact, fund of knowledge intact, attention and concentration intact, speech fluent and not dysarthric, language intact. Cranial nerves: CN I: not tested CN II: pupils equal, round and reactive to light, visual fields intact CN III, IV, VI:  full range of motion, no nystagmus, no ptosis CN V: facial sensation intact CN VII: upper and lower face symmetric CN VIII: hearing intact CN IX, X: gag intact, uvula midline CN XI: sternocleidomastoid and trapezius muscles intact CN XII: tongue midline Bulk & Tone: normal, no fasciculations. Motor:  5/5 throughout  Sensation:  Pinprick and vibration sensation intact.  Deep Tendon Reflexes:  2+ throughout, toes downgoing.   Finger to nose testing:  Without dysmetria.  Heel to shin:  Without dysmetria.  * Gait:  Normal station and stride.  Able to turn and tandem walk. Romberg negative.  IMPRESSION: 1.  Transient ischemic attack 2.  Hypertension.  Reports his blood pressure is elevated today due to White Coat Syndrome. 3.  Hyperlipidemia 4.  Type 2 diabetes mellitus, diet-controlled.  PLAN: 1.  Secondary stroke prevention as per PCP: -  Plavix 75mg  daily for secondary stroke prevention.  May discontinue ASA. -  Continue rosuvastatin 10mg .  LDL goal  less than 70.  He reports he is having labs performed on  11/4. -  Glycemic control.  Hgb A1c goal less than 7.  He reports he is having labs performed on 11/4. -  Optimize blood pressure control.  2.  MRI of brain 3.  Complete echocardiogram 4.  14 day ZIO patch cardiac event monitor 5.  Mediterranean diet. 6.  Routine exercise 7.  Follow up in 4 to 6 months.  Thank you for allowing me to take part in the care of this patient.  Metta Clines, DO  CC:  Edwinna Areola. [place] Crane, MD

## 2019-11-07 ENCOUNTER — Other Ambulatory Visit: Payer: Self-pay

## 2019-11-07 ENCOUNTER — Encounter: Payer: Self-pay | Admitting: Neurology

## 2019-11-07 ENCOUNTER — Ambulatory Visit (INDEPENDENT_AMBULATORY_CARE_PROVIDER_SITE_OTHER): Payer: Medicare Other | Admitting: Neurology

## 2019-11-07 VITALS — BP 187/74 | HR 52 | Ht 68.0 in | Wt 165.0 lb

## 2019-11-07 DIAGNOSIS — I10 Essential (primary) hypertension: Secondary | ICD-10-CM

## 2019-11-07 DIAGNOSIS — E785 Hyperlipidemia, unspecified: Secondary | ICD-10-CM

## 2019-11-07 DIAGNOSIS — E119 Type 2 diabetes mellitus without complications: Secondary | ICD-10-CM

## 2019-11-07 DIAGNOSIS — G459 Transient cerebral ischemic attack, unspecified: Secondary | ICD-10-CM

## 2019-11-07 NOTE — Patient Instructions (Signed)
1.  Stop aspirin.  Continue Plavix 75mg  daily 2.  Continue Crestor.  3.  Continue blood pressure management 4.  Will check MRI of brain without contrast 5.  Will check complete echocardiogram with bubble study 6.  Will check 14 day ZIO patch cardiac monitor 7.  Mediterranean diet (see below) 8.  Routine exercise 9.  Follow up in 4 to 6 months.   Mediterranean Diet A Mediterranean diet refers to food and lifestyle choices that are based on the traditions of countries located on the [employer]. This way of eating has been shown to help prevent certain conditions and improve outcomes for people who have chronic diseases, like kidney disease and heart disease. What are tips for following this plan? Lifestyle  Cook and eat meals together with your family, when possible.  Drink enough fluid to keep your urine clear or pale yellow.  Be physically active every day. This includes: ? Aerobic exercise like running or swimming. ? Leisure activities like gardening, walking, or housework.  Get 7-8 hours of sleep each night.  If recommended by your health care provider, drink red wine in moderation. This means 1 glass a day for nonpregnant women and 2 glasses a day for men. A glass of wine equals 5 oz (150 mL). Reading food labels   Check the serving size of packaged foods. For foods such as rice and pasta, the serving size refers to the amount of cooked product, not dry.  Check the total fat in packaged foods. Avoid foods that have saturated fat or trans fats.  Check the ingredients list for added sugars, such as corn syrup. Shopping  At the grocery store, buy most of your food from the areas near the walls of the store. This includes: ? Fresh fruits and vegetables (produce). ? Grains, beans, nuts, and seeds. Some of these may be available in unpackaged forms or large amounts (in bulk). ? Fresh seafood. ? Poultry and eggs. ? Low-fat dairy products.  Buy whole ingredients instead  of prepackaged foods.  Buy fresh fruits and vegetables in-season from local farmers markets.  Buy frozen fruits and vegetables in resealable bags.  If you do not have access to quality fresh seafood, buy precooked frozen shrimp or canned fish, such as tuna, salmon, or sardines.  Buy small amounts of raw or cooked vegetables, salads, or olives from the deli or salad bar at your store.  Stock your pantry so you always have certain foods on hand, such as olive oil, canned tuna, canned tomatoes, rice, pasta, and beans. Cooking  Cook foods with extra-virgin olive oil instead of using butter or other vegetable oils.  Have meat as a side dish, and have vegetables or grains as your main dish. This means having meat in small portions or adding small amounts of meat to foods like pasta or stew.  Use beans or vegetables instead of meat in common dishes like chili or lasagna.  Experiment with different cooking methods. Try roasting or broiling vegetables instead of steaming or sauteing them.  Add frozen vegetables to soups, stews, pasta, or rice.  Add nuts or seeds for added healthy fat at each meal. You can add these to yogurt, salads, or vegetable dishes.  Marinate fish or vegetables using olive oil, lemon juice, garlic, and fresh herbs. Meal planning   Plan to eat 1 vegetarian meal one day each week. Try to work up to 2 vegetarian meals, if possible.  Eat seafood 2 or more times a week.  Have healthy snacks readily available, such as: ? Vegetable sticks with hummus. ? [place] yogurt. ? Fruit and nut trail mix.  Eat balanced meals throughout the week. This includes: ? Fruit: 2-3 servings a day ? Vegetables: 4-5 servings a day ? Low-fat dairy: 2 servings a day ? Fish, poultry, or lean meat: 1 serving a day ? Beans and legumes: 2 or more servings a week ? Nuts and seeds: 1-2 servings a day ? Whole grains: 6-8 servings a day ? Extra-virgin olive oil: 3-4 servings a day  Limit red  meat and sweets to only a few servings a month What are my food choices?  Mediterranean diet ? Recommended  Grains: Whole-grain pasta. Brown rice. Bulgar wheat. Polenta. Couscous. Whole-wheat bread. [place].  Vegetables: Artichokes. Beets. Broccoli. Cabbage. Carrots. Eggplant. Green beans. Chard. Kale. Spinach. Onions. Leeks. Peas. Squash. Tomatoes. Peppers. Radishes.  Fruits: Apples. Apricots. Avocado. Berries. Bananas. Cherries. Dates. Figs. Grapes. Lemons. Melon. Oranges. Peaches. Plums. Pomegranate.  Meats and other protein foods: Beans. Almonds. Sunflower seeds. Pine nuts. Peanuts. [place]. Salmon. Scallops. Shrimp. [place]. Tilapia. Clams. Oysters. Eggs.  Dairy: Low-fat milk. Cheese. Greek yogurt.  Beverages: Water. Red wine. Herbal tea.  Fats and oils: Extra virgin olive oil. Avocado oil. Grape seed oil.  Sweets and desserts: [place] yogurt with honey. Baked apples. Poached pears. Trail mix.  Seasoning and other foods: Basil. Cilantro. Coriander. Cumin. Mint. Parsley. Sage. Rosemary. Tarragon. Garlic. Oregano. Thyme. Pepper. Balsalmic vinegar. Tahini. Hummus. Tomato sauce. Olives. Mushrooms. ? Limit these  Grains: Prepackaged pasta or rice dishes. Prepackaged cereal with added sugar.  Vegetables: Deep fried potatoes (french fries).  Fruits: Fruit canned in syrup.  Meats and other protein foods: Beef. Pork. Lamb. Poultry with skin. Hot dogs. Berniece Salines.  Dairy: Ice cream. Sour cream. Whole milk.  Beverages: Juice. Sugar-sweetened soft drinks. Beer. Liquor and spirits.  Fats and oils: Butter. Canola oil. Vegetable oil. Beef fat (tallow). Lard.  Sweets and desserts: Cookies. Cakes. Pies. Candy.  Seasoning and other foods: Mayonnaise. Premade sauces and marinades. The items listed may not be a complete list. Talk with your dietitian about what dietary choices are right for you. Summary  The Mediterranean diet includes both food and lifestyle choices.  Eat a variety of  fresh fruits and vegetables, beans, nuts, seeds, and whole grains.  Limit the amount of red meat and sweets that you eat.  Talk with your health care provider about whether it is safe for you to drink red wine in moderation. This means 1 glass a day for nonpregnant women and 2 glasses a day for men. A glass of wine equals 5 oz (150 mL). This information is not intended to replace advice given to you by your health care provider. Make sure you discuss any questions you have with your health care provider. Document Revised: 09/10/2015 Document Reviewed: 09/03/2015 Elsevier Patient Education  [place].

## 2019-11-11 ENCOUNTER — Telehealth: Payer: Self-pay | Admitting: *Deleted

## 2019-11-11 ENCOUNTER — Ambulatory Visit (HOSPITAL_BASED_OUTPATIENT_CLINIC_OR_DEPARTMENT_OTHER)
Admission: RE | Admit: 2019-11-11 | Discharge: 2019-11-11 | Disposition: A | Discharge status: Home / Self Care | Payer: Medicare Other | Source: Ambulatory Visit | Attending: Neurology | Admitting: Neurology

## 2019-11-11 ENCOUNTER — Encounter (HOSPITAL_COMMUNITY): Payer: Self-pay | Admitting: Emergency Medicine

## 2019-11-11 ENCOUNTER — Other Ambulatory Visit: Payer: Self-pay

## 2019-11-11 ENCOUNTER — Emergency Department (HOSPITAL_COMMUNITY)
Admission: EM | Admit: 2019-11-11 | Discharge: 2019-11-11 | Disposition: A | Discharge status: Home / Self Care | Payer: Medicare Other | Attending: Emergency Medicine | Admitting: Emergency Medicine

## 2019-11-11 DIAGNOSIS — E1122 Type 2 diabetes mellitus with diabetic chronic kidney disease: Secondary | ICD-10-CM | POA: Insufficient documentation

## 2019-11-11 DIAGNOSIS — Z79899 Other long term (current) drug therapy: Secondary | ICD-10-CM | POA: Insufficient documentation

## 2019-11-11 DIAGNOSIS — Z7982 Long term (current) use of aspirin: Secondary | ICD-10-CM | POA: Diagnosis not present

## 2019-11-11 DIAGNOSIS — M549 Dorsalgia, unspecified: Secondary | ICD-10-CM | POA: Diagnosis not present

## 2019-11-11 DIAGNOSIS — G459 Transient cerebral ischemic attack, unspecified: Secondary | ICD-10-CM

## 2019-11-11 DIAGNOSIS — R52 Pain, unspecified: Secondary | ICD-10-CM | POA: Diagnosis not present

## 2019-11-11 DIAGNOSIS — I10 Essential (primary) hypertension: Secondary | ICD-10-CM

## 2019-11-11 DIAGNOSIS — R5381 Other malaise: Secondary | ICD-10-CM | POA: Diagnosis not present

## 2019-11-11 DIAGNOSIS — I129 Hypertensive chronic kidney disease with stage 1 through stage 4 chronic kidney disease, or unspecified chronic kidney disease: Secondary | ICD-10-CM | POA: Diagnosis not present

## 2019-11-11 DIAGNOSIS — N189 Chronic kidney disease, unspecified: Secondary | ICD-10-CM | POA: Insufficient documentation

## 2019-11-11 LAB — ECHOCARDIOGRAM COMPLETE BUBBLE STUDY
AR max vel: 2.98 cm2
AV Area VTI: 3.46 cm2
AV Area mean vel: 3.58 cm2
AV Mean grad: 4.3 mmHg
AV Peak grad: 10.3 mmHg
Ao pk vel: 1.61 m/s
Area-P 1/2: 2.03 cm2
S' Lateral: 2.28 cm

## 2019-11-11 NOTE — ED Notes (Signed)
PT self ambulated in the hall with steady gait x 100 feet and tolerated well.

## 2019-11-11 NOTE — ED Notes (Signed)
PT educated with DC instructions and verbalized complete understanding denies questions at this time. Pt IV removed fully intact and dressing applied. Pt self ambulated to exit with steady gait.

## 2019-11-11 NOTE — ED Provider Notes (Signed)
[facility] Provider Note   CSN: [id number] Arrival date & time: 11/11/19  0016     History Chief Complaint  Patient presents with  . Hypertension    Stephen [name] is a 84 y.o. male.  The history is provided by the patient.  Hypertension This is a new problem. The problem occurs constantly. The problem has been gradually improving. Pertinent negatives include no chest pain, no abdominal pain, no headaches and no shortness of breath. Nothing aggravates the symptoms. Nothing relieves the symptoms.  Patient with history of hypertension, diabetes, previous TIA presents with high blood pressure.  Patient reports he checked his blood pressure at home in the evening and it was elevated "over 100 " He did not have any symptoms at that time.  He specifically denies fever/vomiting/headache/visual changes.  Denies chest pain or shortness of breath.  No focal weakness.  He also reports intermittent chronic back pain that is improved.     Past Medical History:  Diagnosis Date  . Adenomatous colon polyp   . Allergy   . Arthritis   . BPH (benign prostatic hypertrophy)   . Cataract    bil cataracts removed  . Chest pain   . CKD (chronic kidney disease)   . Diabetes mellitus without complication ([place])    controlled with diet  . Diverticulosis   . GERD (gastroesophageal reflux disease)   . Glaucoma   . Helicobacter pylori (H. pylori)   . Hemorrhoids   . Hyperlipidemia   . Hypertension   . Kidney stones   . TIA (transient ischemic attack)    "side affect from medication had symptoms of TIA"  . UTI (lower urinary tract infection)     Patient Active Problem List   Diagnosis Date Noted  . Speech disturbance 10/06/2019  . Carotid artery plaque 07/31/2013  . Amaurosis fugax, left eye 07/02/2013  . TIA (transient ischemic attack) 07/02/2013  . Visual changes 07/02/2013  . HYPERLIPIDEMIA 02/13/2009  . HYPERTENSION, BENIGN 02/13/2009  . TRANSIENT ISCHEMIC  ATTACK 02/13/2009  . NEPHROLITHIASIS 02/13/2009  . CHEST PAIN 02/13/2009  . BENIGN PROSTATIC HYPERTROPHY, HX OF 02/13/2009  . ADENOMATOUS COLONIC POLYP 11/15/2007  . PERSONAL HX COLONIC POLYPS 11/15/2007    Past Surgical History:  Procedure Laterality Date  . CATARACT EXTRACTION Bilateral   . COLONOSCOPY    . CYSTOSCOPY WITH STENT PLACEMENT Left 05/01/2012   Procedure: CYSTOSCOPY WITH URETEROSCOPY AND STONE EXTRACTION, LEFT STENT PLACEMENT ;  Surgeon: Malka So, MD;  Location: [facility];  Service: Urology;  Laterality: Left;  STONE EXTRACTION  STENT PLACEMENT    . ESOPHAGOGASTRODUODENOSCOPY ENDOSCOPY    . LITHOTRIPSY         Family History  Problem Relation Age of Onset  . Heart attack Father   . Pancreatic cancer Brother   . Colon polyps Brother   . Lung cancer Brother   . Heart disease Brother   . Colon cancer Brother   . Prostate cancer Brother   . Diabetes Brother        x 3 broderline DM  . Esophageal cancer Neg Hx   . Stomach cancer Neg Hx     Social History   Tobacco Use  . Smoking status: Never Smoker  . Smokeless tobacco: Never Used  Vaping Use  . Vaping Use: Never used  Substance Use Topics  . Alcohol use: No  . Drug use: No    Home Medications Prior to Admission medications   Medication Sig Start  Date End Date Taking? Authorizing Provider  Alpha-Lipoic Acid 200 MG CAPS Take 1 capsule by mouth daily.    [provider]  AMBULATORY NON FORMULARY MEDICATION Flaxseed Meal 1/4 cup 3-4 times/week    [provider]  AMBULATORY NON FORMULARY MEDICATION Healthy Prostate  Take 1 tablet by mouth twice daily    [provider]  amLODipine (NORVASC) 10 MG tablet Take 1 tablet (10 mg total) by mouth every evening. 10/06/19   Roxan Hockey, MD  Ascorbic Acid (VITAMIN C) 500 MG CAPS Take 500 mg by mouth 2 (two) times daily.     [provider]  aspirin EC 81 MG tablet Take 1 tablet (81 mg total) by mouth daily with breakfast.  1)Please take Aspirin 81 mg daily along with Plavix 75 mg daily for 30 days then after that STOP the aspirin and continue ONLY Plavix 75 mg daily indefinitely--for stroke prevention 10/06/19 10/05/20  Roxan Hockey, MD  ASTAXANTHIN PO Take 1 tablet by mouth daily.    [provider]  Berberine Chloride POWD Take 500 mg by mouth 2 (two) times daily with a meal.    [provider]  Cholecalciferol (VITAMIN D3) 5000 UNITS TABS Take 1 tablet by mouth daily.    [provider]  clopidogrel (PLAVIX) 75 MG tablet Take 1 tablet (75 mg total) by mouth daily. 1)Please take Aspirin 81 mg daily along with Plavix 75 mg daily for 30 days then after that STOP the aspirin and continue ONLY Plavix 75 mg daily indefinitely--for stroke prevention 10/06/19 10/05/20  Roxan Hockey, MD  Coenzyme Q10 (CO Q 10) 100 MG CAPS Take 1 capsule by mouth 2 (two) times daily.    [provider]  Cyanocobalamin (VITAMIN B12) 3000 MCG/ML LIQD Place 1 tablet under the tongue daily.    [provider]  Magnesium 500 MG TABS Take 500 mg by mouth daily.     [provider]  metoprolol tartrate (LOPRESSOR) 50 MG tablet Take 1 tablet (50 mg total) by mouth 2 (two) times daily. 10/06/19   Roxan Hockey, MD  Misc Natural Products (BLACK CHERRY CONCENTRATE PO) Take 1 capsule by mouth in the morning and at bedtime.    [provider]  Multiple Vitamin (MULTIVITAMIN WITH MINERALS) TABS tablet Take 1 tablet by mouth daily.    [provider]  niacin (NIASPAN) 1000 MG CR tablet Take 1,000 mg by mouth at bedtime.     [provider]  NON FORMULARY Advance Memory Formula    [provider]  Omega 3-6-9 Fatty Acids (TRIPLE OMEGA-3-6-9) CAPS Take 1 capsule by mouth 2 (two) times daily.    [provider]  rosuvastatin (CRESTOR) 10 MG tablet Take 1 tablet (10 mg total) by mouth at bedtime. For stroke prevention 10/06/19   Roxan Hockey, MD  timolol  (BETIMOL) 0.5 % ophthalmic solution Place 1 drop into both eyes 2 (two) times daily.     [provider]  vitamin E (VITAMIN E) 400 UNIT capsule Take 400 Units by mouth daily.     [provider]    Allergies    Levofloxacin, Benicar hct [olmesartan medoxomil-hctz], Celebrex [celecoxib], Combigan [brimonidine tartrate-timolol], Hydralazine, Metronidazole, Propoxyphene napsylate [propoxyphene], and Clarithromycin  Review of Systems   Review of Systems  Constitutional: Negative for fever.  Eyes: Negative for visual disturbance.  Respiratory: Negative for shortness of breath.   Cardiovascular: Negative for chest pain.  Gastrointestinal: Negative for abdominal pain.  Neurological: Negative for dizziness, syncope,  weakness, light-headedness, numbness and headaches.  All other systems reviewed and are negative.   Physical Exam Updated Vital Signs BP (!) 172/77 (BP Location: Left Arm)   Pulse (!) 50   Temp 98.1 F (36.7 C) (Oral)   Resp 15   Ht 1.727 m (5\' 8" )   Wt 75 kg   SpO2 100%   BMI 25.14 kg/m   Physical Exam CONSTITUTIONAL: elderly, no acute distress HEAD: Normocephalic/atraumatic EYES: EOMI/PERRL, no nystagmus,  no ptosis ENMT: Mucous membranes moist NECK: supple no meningeal signs, no bruits CV: S1/S2 noted, no murmurs/rubs/gallops noted LUNGS: Lungs are clear to auscultation bilaterally, no apparent distress ABDOMEN: soft, nontender, no rebound or guarding GU:no cva tenderness NEURO:Awake/alert, face symmetric, no arm or leg drift is noted Equal 5/5 strength with shoulder abduction, elbow flex/extension, wrist flex/extension in upper extremities and equal hand grips bilaterally Equal 5/5 strength with hip flexion,knee flex/extension, foot dorsi/plantar flexion Cranial nerves 3/4/5/6/08/01/08/11/12 tested and intact Gait normal without ataxia No past pointing Sensation to light touch intact in all extremities EXTREMITIES: pulses normal, full  ROM SKIN: warm, color normal PSYCH: no abnormalities of mood noted  ED Results / Procedures / Treatments   Labs (all labs ordered are listed, but only abnormal results are displayed) Labs Reviewed - No data to display  EKG EKG Interpretation  Date/Time:  Monday November 11 2019 02:52:41 EDT Ventricular Rate:  48 PR Interval:    QRS Duration: 90 QT Interval:  437 QTC Calculation: 391 R Axis:   39 Text Interpretation: Sinus bradycardia Prolonged PR interval Low voltage, precordial leads RSR' in V1 or V2, probably normal variant Confirmed by Ripley Fraise [phone]) on 11/11/2019 3:49:35 AM   Radiology No results found.  Procedures Procedures    Medications Ordered in ED Medications - No data to display  ED Course  I have reviewed the triage vital signs and the nursing notes.     MDM Rules/Calculators/A&P                          3:25 AM Patient very well-appearing. He presents for elevated blood pressure.  He also mentioned back pain that been on and off for months but that is improved. He was recently evaluated for a TIA last month, and recently seen by neurology as an outpatient.  He is on Plavix, was recommended to have improved blood pressure control and has outpatient work-up scheduled He reports when he checked his blood pressure at home he was concerned that it was still high, but does not check it regularly.  He was asymptomatic at that time.  He ambulated here and is in no distress.  No focal neuro deficits.  He is mildly bradycardic but reports this is chronic for him and he is on Lopressor 4:04 AM Patient has extensive work-up as an outpatient starting this week.  At this time there is no signs of acute neurologic emergency, no signs of acute hypertensive emergency.  Advised daily blood pressure checks and follow with PCP as he may need medication adjustment Final Clinical Impression(s) / ED Diagnoses Final diagnoses:  Primary hypertension    Rx / DC  Orders ED Discharge Orders    None       Ripley Fraise, MD 11/11/19 [phone]

## 2019-11-11 NOTE — Progress Notes (Signed)
*  PRELIMINARY RESULTS* Echocardiogram 2D Echocardiogram has been performed with saline bubble study.  Samuel Germany 11/11/2019, 9:31 AM

## 2019-11-11 NOTE — Discharge Instructions (Addendum)
Be sure to check your blood pressure every day and record it.  Please follow with your primary doctor closely as you may need changes in your medicine

## 2019-11-11 NOTE — ED Triage Notes (Signed)
Pt comes in via [facility] for lower back pain. Alert and oriented.

## 2019-11-11 NOTE — ED Triage Notes (Signed)
Pt brought in by EMS because he checked his BP @ home and he said his SBP was "way over 100" but his DBP was in the "70's". Pt also with back pain that he has suffered with off and on for several months.

## 2019-11-11 NOTE — Telephone Encounter (Signed)
Received call from [facility] stating that pt need 14 day monitor for TIA. Pt enrolled in [facility].

## 2019-11-12 ENCOUNTER — Ambulatory Visit (HOSPITAL_COMMUNITY)
Admission: RE | Admit: 2019-11-12 | Discharge: 2019-11-12 | Disposition: A | Discharge status: Home / Self Care | Payer: Medicare Other | Source: Ambulatory Visit | Attending: Neurology | Admitting: Neurology

## 2019-11-12 ENCOUNTER — Other Ambulatory Visit: Payer: Self-pay

## 2019-11-12 DIAGNOSIS — I6389 Other cerebral infarction: Secondary | ICD-10-CM | POA: Diagnosis not present

## 2019-11-12 DIAGNOSIS — G319 Degenerative disease of nervous system, unspecified: Secondary | ICD-10-CM | POA: Diagnosis not present

## 2019-11-12 DIAGNOSIS — E785 Hyperlipidemia, unspecified: Secondary | ICD-10-CM | POA: Insufficient documentation

## 2019-11-12 DIAGNOSIS — G459 Transient cerebral ischemic attack, unspecified: Secondary | ICD-10-CM | POA: Diagnosis not present

## 2019-11-12 DIAGNOSIS — H748X1 Other specified disorders of right middle ear and mastoid: Secondary | ICD-10-CM | POA: Diagnosis not present

## 2019-11-12 DIAGNOSIS — I10 Essential (primary) hypertension: Secondary | ICD-10-CM | POA: Diagnosis not present

## 2019-11-12 DIAGNOSIS — E119 Type 2 diabetes mellitus without complications: Secondary | ICD-10-CM | POA: Insufficient documentation

## 2019-11-12 DIAGNOSIS — I6782 Cerebral ischemia: Secondary | ICD-10-CM | POA: Diagnosis not present

## 2019-11-28 DIAGNOSIS — J069 Acute upper respiratory infection, unspecified: Secondary | ICD-10-CM | POA: Diagnosis not present

## 2019-11-28 DIAGNOSIS — R944 Abnormal results of kidney function studies: Secondary | ICD-10-CM | POA: Diagnosis not present

## 2019-11-28 DIAGNOSIS — E782 Mixed hyperlipidemia: Secondary | ICD-10-CM | POA: Diagnosis not present

## 2019-11-28 DIAGNOSIS — I10 Essential (primary) hypertension: Secondary | ICD-10-CM | POA: Diagnosis not present

## 2019-11-28 DIAGNOSIS — R946 Abnormal results of thyroid function studies: Secondary | ICD-10-CM | POA: Diagnosis not present

## 2019-11-28 DIAGNOSIS — J309 Allergic rhinitis, unspecified: Secondary | ICD-10-CM | POA: Diagnosis not present

## 2019-11-28 DIAGNOSIS — K5900 Constipation, unspecified: Secondary | ICD-10-CM | POA: Diagnosis not present

## 2019-11-28 DIAGNOSIS — R972 Elevated prostate specific antigen [PSA]: Secondary | ICD-10-CM | POA: Diagnosis not present

## 2019-11-28 DIAGNOSIS — Z9181 History of falling: Secondary | ICD-10-CM | POA: Diagnosis not present

## 2019-11-28 DIAGNOSIS — Z6827 Body mass index (BMI) 27.0-27.9, adult: Secondary | ICD-10-CM | POA: Diagnosis not present

## 2019-11-28 DIAGNOSIS — C44310 Basal cell carcinoma of skin of unspecified parts of face: Secondary | ICD-10-CM | POA: Diagnosis not present

## 2019-11-28 DIAGNOSIS — E119 Type 2 diabetes mellitus without complications: Secondary | ICD-10-CM | POA: Diagnosis not present

## 2019-12-03 DIAGNOSIS — R972 Elevated prostate specific antigen [PSA]: Secondary | ICD-10-CM | POA: Diagnosis not present

## 2019-12-03 DIAGNOSIS — I10 Essential (primary) hypertension: Secondary | ICD-10-CM | POA: Diagnosis not present

## 2019-12-03 DIAGNOSIS — E1165 Type 2 diabetes mellitus with hyperglycemia: Secondary | ICD-10-CM | POA: Diagnosis not present

## 2019-12-03 DIAGNOSIS — E782 Mixed hyperlipidemia: Secondary | ICD-10-CM | POA: Diagnosis not present

## 2019-12-03 DIAGNOSIS — Z712 Person consulting for explanation of examination or test findings: Secondary | ICD-10-CM | POA: Diagnosis not present

## 2019-12-03 DIAGNOSIS — R944 Abnormal results of kidney function studies: Secondary | ICD-10-CM | POA: Diagnosis not present

## 2019-12-03 DIAGNOSIS — M5441 Lumbago with sciatica, right side: Secondary | ICD-10-CM | POA: Diagnosis not present

## 2019-12-03 DIAGNOSIS — H4010X0 Unspecified open-angle glaucoma, stage unspecified: Secondary | ICD-10-CM | POA: Diagnosis not present

## 2019-12-03 DIAGNOSIS — K635 Polyp of colon: Secondary | ICD-10-CM | POA: Diagnosis not present

## 2019-12-03 DIAGNOSIS — R946 Abnormal results of thyroid function studies: Secondary | ICD-10-CM | POA: Diagnosis not present

## 2019-12-03 DIAGNOSIS — J309 Allergic rhinitis, unspecified: Secondary | ICD-10-CM | POA: Diagnosis not present

## 2019-12-05 NOTE — Progress Notes (Signed)
Subjective:  1. Nocturia   2. Benign prostatic hyperplasia, unspecified whether lower urinary tract symptoms present   3. Nephrolithiasis   4. Chronic midline low back pain, unspecified whether sciatica present       I have kidney stones.   HPI: Stephen [name] is a 84 year-old male established patient who is here for renal calculi.  12/06/19: Stephen [name] returns today in f/u for his history of stones.  He has no hematuria.  He has no flank pain.  He is voiding well with an IPSS of 7.   His UA today is clear.   He has had some back pain that was worse earlier this year.    He has had eswl, ureteral stent, and ureteroscopy for treatment of his stones in the past.     [place] Name 12/06/19 1400         International Prostate Symptom Score   How often have you had the sensation of not emptying your bladder? Not at All     How often have you had to urinate less than every two hours? About half the time     How often have you found you stopped and started again several times when you urinated? Not at All     How often have you found it difficult to postpone urination? Not at All     How often have you had a weak urinary stream? About half the time     How often have you had to strain to start urination? Not at All     How many times did you typically get up at night to urinate? 1 Time     Total IPSS Score 7             ROS:  ROS:  A complete review of systems was performed.  All systems are negative except for pertinent findings as noted.   ROS  Allergies  Allergen Reactions  . Levofloxacin Other (See Comments)    Severe headache  . Benicar Hct [Olmesartan Medoxomil-Hctz] Other (See Comments)    Renal damage  . Celebrex [Celecoxib] Other (See Comments)    confusion  . Combigan [Brimonidine Tartrate-Timolol] Other (See Comments)    Eye irritation   . Hydralazine Other (See Comments)    Unknown   . Metronidazole Other (See Comments)    Headache  . Propoxyphene  Napsylate [Propoxyphene] Other (See Comments)    With Acetaminophen, severe headache and trouble urinating  . Clarithromycin Rash    Bad Headache    Outpatient Encounter Medications as of 12/06/2019  Medication Sig  . Alpha-Lipoic Acid 200 MG CAPS Take 1 capsule by mouth daily.  . AMBULATORY NON FORMULARY MEDICATION Flaxseed Meal 1/4 cup 3-4 times/week  . AMBULATORY NON FORMULARY MEDICATION Healthy Prostate  Take 1 tablet by mouth twice daily  . amLODipine (NORVASC) 10 MG tablet Take 1 tablet (10 mg total) by mouth every evening.  . Ascorbic Acid (VITAMIN C) 500 MG CAPS Take 500 mg by mouth 2 (two) times daily.   Marland Kitchen aspirin EC 81 MG tablet Take 1 tablet (81 mg total) by mouth daily with breakfast. 1)Please take Aspirin 81 mg daily along with Plavix 75 mg daily for 30 days then after that STOP the aspirin and continue ONLY Plavix 75 mg daily indefinitely--for stroke prevention  . ASTAXANTHIN PO Take 1 tablet by mouth daily.  . Berberine Chloride POWD Take 500 mg by mouth 2 (two) times daily with a meal.  .  Cholecalciferol (VITAMIN D3) 5000 UNITS TABS Take 1 tablet by mouth daily.  . clopidogrel (PLAVIX) 75 MG tablet Take 1 tablet (75 mg total) by mouth daily. 1)Please take Aspirin 81 mg daily along with Plavix 75 mg daily for 30 days then after that STOP the aspirin and continue ONLY Plavix 75 mg daily indefinitely--for stroke prevention  . Coenzyme Q10 (CO Q 10) 100 MG CAPS Take 1 capsule by mouth 2 (two) times daily.  . Cyanocobalamin (VITAMIN B12) 3000 MCG/ML LIQD Place 1 tablet under the tongue daily.  . Magnesium 500 MG TABS Take 500 mg by mouth daily.   . metoprolol tartrate (LOPRESSOR) 50 MG tablet Take 1 tablet (50 mg total) by mouth 2 (two) times daily.  . Misc Natural Products (BLACK CHERRY CONCENTRATE PO) Take 1 capsule by mouth in the morning and at bedtime.  . Multiple Vitamin (MULTIVITAMIN WITH MINERALS) TABS tablet Take 1 tablet by mouth daily.  . niacin (NIASPAN) 1000 MG CR  tablet Take 1,000 mg by mouth at bedtime.   . NON FORMULARY Advance Memory Formula  . Omega 3-6-9 Fatty Acids (TRIPLE OMEGA-3-6-9) CAPS Take 1 capsule by mouth 2 (two) times daily.  Marland Kitchen OVER THE COUNTER MEDICATION Multiple over the counter medications.  . rosuvastatin (CRESTOR) 10 MG tablet Take 1 tablet (10 mg total) by mouth at bedtime. For stroke prevention  . timolol (BETIMOL) 0.5 % ophthalmic solution Place 1 drop into both eyes 2 (two) times daily.   . timolol (TIMOPTIC) 0.5 % ophthalmic solution   . vitamin E (VITAMIN E) 400 UNIT capsule Take 400 Units by mouth daily.    Facility-Administered Encounter Medications as of 12/06/2019  Medication  . 0.9 %  sodium chloride infusion    Past Medical History:  Diagnosis Date  . Adenomatous colon polyp   . Allergy   . Arthritis   . BPH (benign prostatic hypertrophy)   . Cataract    bil cataracts removed  . Chest pain   . CKD (chronic kidney disease)   . Diabetes mellitus without complication ([place])    controlled with diet  . Diverticulosis   . GERD (gastroesophageal reflux disease)   . Glaucoma   . Helicobacter pylori (H. pylori)   . Hemorrhoids   . Hyperlipidemia   . Hypertension   . Kidney stones   . TIA (transient ischemic attack)    "side affect from medication had symptoms of TIA"  . UTI (lower urinary tract infection)     Past Surgical History:  Procedure Laterality Date  . CATARACT EXTRACTION Bilateral   . COLONOSCOPY    . CYSTOSCOPY WITH STENT PLACEMENT Left 05/01/2012   Procedure: CYSTOSCOPY WITH URETEROSCOPY AND STONE EXTRACTION, LEFT STENT PLACEMENT ;  Surgeon: Malka So, MD;  Location: [facility];  Service: Urology;  Laterality: Left;  STONE EXTRACTION  STENT PLACEMENT    . ESOPHAGOGASTRODUODENOSCOPY ENDOSCOPY    . LITHOTRIPSY      Social History   Socioeconomic History  . Marital status: Widowed    Spouse name: Not on file  . Number of children: 0  . Years of education: 12th  . Highest education level:  Not on file  Occupational History  . Occupation: Retired from [employer] work  Tobacco Use  . Smoking status: Never Smoker  . Smokeless tobacco: Never Used  Vaping Use  . Vaping Use: Never used  Substance and Sexual Activity  . Alcohol use: No  . Drug use: No  . Sexual activity: Never  Other  Topics Concern  . Not on file  Social History Narrative   Lives in [place] by himself, Lower level   Regular exercise   Right handed   Social Determinants of Health   Financial Resource Strain:   . Difficulty of Paying Living Expenses: Not on file  Food Insecurity:   . Worried About Charity fundraiser in the Last Year: Not on file  . Ran Out of Food in the Last Year: Not on file  Transportation Needs:   . Lack of Transportation (Medical): Not on file  . Lack of Transportation (Non-Medical): Not on file  Physical Activity:   . Days of Exercise per Week: Not on file  . Minutes of Exercise per Session: Not on file  Stress:   . Feeling of Stress : Not on file  Social Connections:   . Frequency of Communication with Friends and Family: Not on file  . Frequency of Social Gatherings with Friends and Family: Not on file  . Attends Religious Services: Not on file  . Active Member of Clubs or Organizations: Not on file  . Attends Archivist Meetings: Not on file  . Marital Status: Not on file  Intimate Partner Violence:   . Fear of Current or Ex-Partner: Not on file  . Emotionally Abused: Not on file  . Physically Abused: Not on file  . Sexually Abused: Not on file    Family History  Problem Relation Age of Onset  . Heart attack Father   . Pancreatic cancer Brother   . Colon polyps Brother   . Lung cancer Brother   . Heart disease Brother   . Colon cancer Brother   . Prostate cancer Brother   . Diabetes Brother        x 3 broderline DM  . Esophageal cancer Neg Hx   . Stomach cancer Neg Hx        Objective: Vitals:   12/06/19 1414  BP: (!) 150/68  Pulse: (!)  52  Temp: 97.9 F (36.6 C)     Physical Exam  Lab Results:   Results for orders placed or performed in visit on 12/06/19 (from the past 24 hour(s))  Urinalysis, Routine w reflex microscopic     Status: None   Collection Time: 12/06/19  2:24 PM  Result Value Ref Range   Specific Gravity, UA 1.020 1.005 - 1.030   pH, UA 5.0 5.0 - 7.5   Color, UA Yellow Yellow   Appearance Ur Clear Clear   Leukocytes,UA Negative Negative   Protein,UA Negative Negative/Trace   Glucose, UA Negative Negative   Ketones, UA Negative Negative   RBC, UA Negative Negative   Bilirubin, UA Negative Negative   Urobilinogen, Ur 0.2 0.2 - 1.0 mg/dL   Nitrite, UA Negative Negative   Microscopic Examination Comment    Narrative   Performed at:  [facility] [address] [place], [place]  [id number] Lab Director: [place], Phone:  [phone]       Studies/Results: UA is clear today.     Assessment & Plan: Low back pain.  This is most consistent with a musculoskeletal cause and I have reassured him and will have him return prn.    History of stones.  He is doing well without symptoms.  BPH with BOO.  He has some frequency and a reduced stream.   No orders of the defined types were placed in this encounter.    Orders Placed This Encounter  Procedures  . Urinalysis, Routine w reflex microscopic      Return if symptoms worsen or fail to improve.   CC: Celene Squibb, MD      Stephen [name] 12/06/2019

## 2019-12-06 ENCOUNTER — Other Ambulatory Visit: Payer: Self-pay

## 2019-12-06 ENCOUNTER — Ambulatory Visit (INDEPENDENT_AMBULATORY_CARE_PROVIDER_SITE_OTHER): Payer: Medicare Other | Admitting: Urology

## 2019-12-06 ENCOUNTER — Encounter: Payer: Self-pay | Admitting: Urology

## 2019-12-06 VITALS — BP 150/68 | HR 52 | Temp 97.9°F | Ht 68.0 in | Wt 165.3 lb

## 2019-12-06 DIAGNOSIS — G8929 Other chronic pain: Secondary | ICD-10-CM | POA: Diagnosis not present

## 2019-12-06 DIAGNOSIS — N400 Enlarged prostate without lower urinary tract symptoms: Secondary | ICD-10-CM

## 2019-12-06 DIAGNOSIS — N200 Calculus of kidney: Secondary | ICD-10-CM

## 2019-12-06 DIAGNOSIS — G464 Cerebellar stroke syndrome: Secondary | ICD-10-CM | POA: Diagnosis not present

## 2019-12-06 DIAGNOSIS — R351 Nocturia: Secondary | ICD-10-CM | POA: Diagnosis not present

## 2019-12-06 DIAGNOSIS — M545 Low back pain, unspecified: Secondary | ICD-10-CM | POA: Diagnosis not present

## 2019-12-06 LAB — URINALYSIS, ROUTINE W REFLEX MICROSCOPIC
Bilirubin, UA: NEGATIVE
Glucose, UA: NEGATIVE
Ketones, UA: NEGATIVE
Leukocytes,UA: NEGATIVE
Nitrite, UA: NEGATIVE
Protein,UA: NEGATIVE
RBC, UA: NEGATIVE
Specific Gravity, UA: 1.02 (ref 1.005–1.030)
Urobilinogen, Ur: 0.2 mg/dL (ref 0.2–1.0)
pH, UA: 5 (ref 5.0–7.5)

## 2019-12-06 NOTE — Progress Notes (Signed)
Urological Symptom Review  Patient is experiencing the following symptoms: Get up at night to urinate   Review of Systems  Gastrointestinal (upper)  : Negative for upper GI symptoms  Gastrointestinal (lower) : Negative for lower GI symptoms  Constitutional : Negative for symptoms  Skin: Negative for skin symptoms  Eyes: Negative for eye symptoms  Ear/Nose/Throat : Negative for Ear/Nose/Throat symptoms  Hematologic/Lymphatic: Negative for Hematologic/Lymphatic symptoms  Cardiovascular : Negative for cardiovascular symptoms  Respiratory : Negative for respiratory symptoms  Endocrine: Negative for endocrine symptoms  Musculoskeletal: Back pain  Neurological: Negative for neurological symptoms  Psychologic: Negative for psychiatric symptoms  

## 2019-12-12 ENCOUNTER — Other Ambulatory Visit: Payer: Self-pay

## 2019-12-12 ENCOUNTER — Other Ambulatory Visit (INDEPENDENT_AMBULATORY_CARE_PROVIDER_SITE_OTHER): Payer: Medicare Other

## 2019-12-12 DIAGNOSIS — G459 Transient cerebral ischemic attack, unspecified: Secondary | ICD-10-CM

## 2019-12-12 NOTE — Addendum Note (Signed)
Addended by: Levonne Hubert on: 12/12/2019 10:04 AM   Modules accepted: Orders

## 2020-01-28 DIAGNOSIS — H401113 Primary open-angle glaucoma, right eye, severe stage: Secondary | ICD-10-CM | POA: Diagnosis not present

## 2020-01-28 DIAGNOSIS — Z961 Presence of intraocular lens: Secondary | ICD-10-CM | POA: Diagnosis not present

## 2020-01-28 DIAGNOSIS — H401121 Primary open-angle glaucoma, left eye, mild stage: Secondary | ICD-10-CM | POA: Diagnosis not present

## 2020-02-28 DIAGNOSIS — Z9181 History of falling: Secondary | ICD-10-CM | POA: Diagnosis not present

## 2020-02-28 DIAGNOSIS — C44310 Basal cell carcinoma of skin of unspecified parts of face: Secondary | ICD-10-CM | POA: Diagnosis not present

## 2020-02-28 DIAGNOSIS — K5900 Constipation, unspecified: Secondary | ICD-10-CM | POA: Diagnosis not present

## 2020-02-28 DIAGNOSIS — J309 Allergic rhinitis, unspecified: Secondary | ICD-10-CM | POA: Diagnosis not present

## 2020-02-28 DIAGNOSIS — J069 Acute upper respiratory infection, unspecified: Secondary | ICD-10-CM | POA: Diagnosis not present

## 2020-02-28 DIAGNOSIS — Z6827 Body mass index (BMI) 27.0-27.9, adult: Secondary | ICD-10-CM | POA: Diagnosis not present

## 2020-02-28 DIAGNOSIS — E119 Type 2 diabetes mellitus without complications: Secondary | ICD-10-CM | POA: Diagnosis not present

## 2020-02-28 DIAGNOSIS — E782 Mixed hyperlipidemia: Secondary | ICD-10-CM | POA: Diagnosis not present

## 2020-02-28 DIAGNOSIS — R946 Abnormal results of thyroid function studies: Secondary | ICD-10-CM | POA: Diagnosis not present

## 2020-02-28 DIAGNOSIS — I10 Essential (primary) hypertension: Secondary | ICD-10-CM | POA: Diagnosis not present

## 2020-02-28 DIAGNOSIS — R944 Abnormal results of kidney function studies: Secondary | ICD-10-CM | POA: Diagnosis not present

## 2020-02-28 DIAGNOSIS — R972 Elevated prostate specific antigen [PSA]: Secondary | ICD-10-CM | POA: Diagnosis not present

## 2020-03-02 DIAGNOSIS — Z85828 Personal history of other malignant neoplasm of skin: Secondary | ICD-10-CM | POA: Diagnosis not present

## 2020-03-02 DIAGNOSIS — D225 Melanocytic nevi of trunk: Secondary | ICD-10-CM | POA: Diagnosis not present

## 2020-03-02 DIAGNOSIS — L812 Freckles: Secondary | ICD-10-CM | POA: Diagnosis not present

## 2020-03-02 DIAGNOSIS — D1801 Hemangioma of skin and subcutaneous tissue: Secondary | ICD-10-CM | POA: Diagnosis not present

## 2020-03-02 DIAGNOSIS — L821 Other seborrheic keratosis: Secondary | ICD-10-CM | POA: Diagnosis not present

## 2020-03-04 DIAGNOSIS — R972 Elevated prostate specific antigen [PSA]: Secondary | ICD-10-CM | POA: Diagnosis not present

## 2020-03-04 DIAGNOSIS — M5441 Lumbago with sciatica, right side: Secondary | ICD-10-CM | POA: Diagnosis not present

## 2020-03-04 DIAGNOSIS — E1165 Type 2 diabetes mellitus with hyperglycemia: Secondary | ICD-10-CM | POA: Diagnosis not present

## 2020-03-04 DIAGNOSIS — E782 Mixed hyperlipidemia: Secondary | ICD-10-CM | POA: Diagnosis not present

## 2020-03-04 DIAGNOSIS — I10 Essential (primary) hypertension: Secondary | ICD-10-CM | POA: Diagnosis not present

## 2020-03-04 DIAGNOSIS — J309 Allergic rhinitis, unspecified: Secondary | ICD-10-CM | POA: Diagnosis not present

## 2020-03-04 DIAGNOSIS — R946 Abnormal results of thyroid function studies: Secondary | ICD-10-CM | POA: Diagnosis not present

## 2020-03-04 DIAGNOSIS — K635 Polyp of colon: Secondary | ICD-10-CM | POA: Diagnosis not present

## 2020-03-04 DIAGNOSIS — R944 Abnormal results of kidney function studies: Secondary | ICD-10-CM | POA: Diagnosis not present

## 2020-03-04 DIAGNOSIS — Z712 Person consulting for explanation of examination or test findings: Secondary | ICD-10-CM | POA: Diagnosis not present

## 2020-03-04 DIAGNOSIS — H4010X0 Unspecified open-angle glaucoma, stage unspecified: Secondary | ICD-10-CM | POA: Diagnosis not present

## 2020-05-06 NOTE — Progress Notes (Signed)
NEUROLOGY FOLLOW UP OFFICE NOTE  Stephen [name] [id number]  Assessment/Plan:   1.  Right hemispheric stroke, cryptogenic 2.  Hypertension - repeat BP 149/76 - typically runs 120s/70s. 3.  Hyperlipidemia 4.  Type 2 diabetes mellitus  1.  Secondary stroke prevention as managed by PCP: - ASA 81mg  daily - Statin therapy.  LDL goal less than 70 - Glycemic control. Hgb A1c goal less than 7 - Blood pressure control 2.  Continue exercise 3.  Follow up in 6 months.  Subjective:  Stephen [name] is an 85 year old left-handed male with HTN, HLD, CKD and diabetes who follows up for stroke.  UPDATE: Current medications:  ASA 81mg , amlodipine, Lopressor, Crestor 10mg , Omega 3-6-9 fatty acids  Underwent stroke workup: MRI of brain without contrast on 11/12/2019 personally reviewed showed 15 mm subacute infarct within the right centrum semiovale/corona radiata.  Echocardiogram on 11/11/2019 showed LVEF 65-70% with negative bubble study and no significant valvular abnormalities.  14 day ZIO patch cardiac monitoring in November demonstrated occasional supraventricular and vetricular ectopy but no atrial fibrillation.    He stopped the Plavix because it caused flushing.  He is now back on the ASA.  Feeling well.  He has been exercising.    HISTORY: On 10/05/2019, he developed slurred speech, also had trouble moving his left hand when writing.  He was also unsteady of his feet.  He had a mild headache as well. No associated language deficits, vision loss, dizziness, or numbness.  Headache and unsteadiness resolved by the next day and slurred speech improved but still persisted.  CT head personally reviewed revealed no acute intracranial abnormality.  CTA of head and neck showed moderate focal left A2 stenosis and moderate/severe right P2 stenosis with patent bilateral ICAs with 40-50% stenosis of right carotid bulb and up to 40% stenosis within left carotid bulb.  He was on ASA prior to  presentation and discharged on ASA with Plavix for 30 days followed by Plavix alone.  He was advised to follow up with his PCP for an echocardiogram.  Slurred speech persisted for another 3 days after hospital discharge.   He had a TIA back in 2010.  He was evaluated by neurologist, Dr. Leonie [name], in 2015 for mild heterogenous plaque at the carotid bifurcations bilaterally.  Allergic Reaction:  Plavix (flushing, paresthesias)  PAST MEDICAL HISTORY: Past Medical History:  Diagnosis Date  . Adenomatous colon polyp   . Allergy   . Arthritis   . BPH (benign prostatic hypertrophy)   . Cataract    bil cataracts removed  . Chest pain   . CKD (chronic kidney disease)   . Diabetes mellitus without complication ([place])    controlled with diet  . Diverticulosis   . GERD (gastroesophageal reflux disease)   . Glaucoma   . Helicobacter pylori (H. pylori)   . Hemorrhoids   . Hyperlipidemia   . Hypertension   . Kidney stones   . TIA (transient ischemic attack)    "side affect from medication had symptoms of TIA"  . UTI (lower urinary tract infection)     MEDICATIONS: Current Outpatient Medications on File Prior to Visit  Medication Sig Dispense Refill  . Alpha-Lipoic Acid 200 MG CAPS Take 1 capsule by mouth daily.    . AMBULATORY NON FORMULARY MEDICATION Flaxseed Meal 1/4 cup 3-4 times/week    . AMBULATORY NON FORMULARY MEDICATION Healthy Prostate  Take 1 tablet by mouth twice daily    . amLODipine (NORVASC) 10 MG  tablet Take 1 tablet (10 mg total) by mouth every evening. 30 tablet 2  . Ascorbic Acid (VITAMIN C) 500 MG CAPS Take 500 mg by mouth 2 (two) times daily.     Marland Kitchen aspirin EC 81 MG tablet Take 1 tablet (81 mg total) by mouth daily with breakfast. 1)Please take Aspirin 81 mg daily along with Plavix 75 mg daily for 30 days then after that STOP the aspirin and continue ONLY Plavix 75 mg daily indefinitely--for stroke prevention 30 tablet 0  . ASTAXANTHIN PO Take 1 tablet by mouth daily.     . Berberine Chloride POWD Take 500 mg by mouth 2 (two) times daily with a meal.    . Cholecalciferol (VITAMIN D3) 5000 UNITS TABS Take 1 tablet by mouth daily.    . clopidogrel (PLAVIX) 75 MG tablet Take 1 tablet (75 mg total) by mouth daily. 1)Please take Aspirin 81 mg daily along with Plavix 75 mg daily for 30 days then after that STOP the aspirin and continue ONLY Plavix 75 mg daily indefinitely--for stroke prevention 30 tablet 11  . Coenzyme Q10 (CO Q 10) 100 MG CAPS Take 1 capsule by mouth 2 (two) times daily.    . Cyanocobalamin (VITAMIN B12) 3000 MCG/ML LIQD Place 1 tablet under the tongue daily.    . Magnesium 500 MG TABS Take 500 mg by mouth daily.     . metoprolol tartrate (LOPRESSOR) 50 MG tablet Take 1 tablet (50 mg total) by mouth 2 (two) times daily. 60 tablet 3  . Misc Natural Products (BLACK CHERRY CONCENTRATE PO) Take 1 capsule by mouth in the morning and at bedtime.    . Multiple Vitamin (MULTIVITAMIN WITH MINERALS) TABS tablet Take 1 tablet by mouth daily.    . niacin (NIASPAN) 1000 MG CR tablet Take 1,000 mg by mouth at bedtime.     . NON FORMULARY Advance Memory Formula    . Omega 3-6-9 Fatty Acids (TRIPLE OMEGA-3-6-9) CAPS Take 1 capsule by mouth 2 (two) times daily.    Marland Kitchen OVER THE COUNTER MEDICATION Multiple over the counter medications.    . rosuvastatin (CRESTOR) 10 MG tablet Take 1 tablet (10 mg total) by mouth at bedtime. For stroke prevention 30 tablet 3  . timolol (BETIMOL) 0.5 % ophthalmic solution Place 1 drop into both eyes 2 (two) times daily.     . timolol (TIMOPTIC) 0.5 % ophthalmic solution     . vitamin E (VITAMIN E) 400 UNIT capsule Take 400 Units by mouth daily.      Current Facility-Administered Medications on File Prior to Visit  Medication Dose Route Frequency Provider Last Rate Last Admin  . 0.9 %  sodium chloride infusion  500 mL Intravenous Once Irene Shipper, MD        ALLERGIES: Allergies  Allergen Reactions  . Levofloxacin Other (See  Comments)    Severe headache  . Benicar Hct [Olmesartan Medoxomil-Hctz] Other (See Comments)    Renal damage  . Celebrex [Celecoxib] Other (See Comments)    confusion  . Combigan [Brimonidine Tartrate-Timolol] Other (See Comments)    Eye irritation   . Hydralazine Other (See Comments)    Unknown   . Metronidazole Other (See Comments)    Headache  . Propoxyphene Napsylate [Propoxyphene] Other (See Comments)    With Acetaminophen, severe headache and trouble urinating  . Clarithromycin Rash    Bad Headache       Plavix  Flushing   FAMILY HISTORY: Family History  Problem Relation Age  of Onset  . Heart attack Father   . Pancreatic cancer Brother   . Colon polyps Brother   . Lung cancer Brother   . Heart disease Brother   . Colon cancer Brother   . Prostate cancer Brother   . Diabetes Brother        x 3 broderline DM  . Esophageal cancer Neg Hx   . Stomach cancer Neg Hx       Objective:  Blood pressure (!) 149/76, pulse (!) 56, height 5\' 8"  (1.727 m), weight 169 lb (76.7 kg), SpO2 97 %. General: No acute distress.  Patient appears well-groomed.   Head:  Normocephalic/atraumatic Eyes:  Fundi examined but not visualized Neck: supple, no paraspinal tenderness, full range of motion Heart:  Regular rate and rhythm Lungs:  Clear to auscultation bilaterally Back: No paraspinal tenderness Neurological Exam: alert and oriented to person, place, and time. Attention span and concentration intact, recent and remote memory intact, fund of knowledge intact.  Speech fluent and not dysarthric, language intact.  CN II-XII intact. Bulk and tone normal, muscle strength 5/5 throughout.  Sensation to light touch intact.  Deep tendon reflexes 2+ throughout, toes downgoing.  Finger to nose testing intact.  Gait normal, Romberg negative.   Metta Clines, DO  CC: Allyn Kenner, MD

## 2020-05-07 ENCOUNTER — Other Ambulatory Visit: Payer: Self-pay

## 2020-05-07 ENCOUNTER — Ambulatory Visit (INDEPENDENT_AMBULATORY_CARE_PROVIDER_SITE_OTHER): Payer: Medicare Other | Admitting: Neurology

## 2020-05-07 ENCOUNTER — Encounter: Payer: Self-pay | Admitting: Neurology

## 2020-05-07 VITALS — BP 149/76 | HR 56 | Ht 68.0 in | Wt 169.0 lb

## 2020-05-07 DIAGNOSIS — E785 Hyperlipidemia, unspecified: Secondary | ICD-10-CM

## 2020-05-07 DIAGNOSIS — E119 Type 2 diabetes mellitus without complications: Secondary | ICD-10-CM

## 2020-05-07 DIAGNOSIS — I639 Cerebral infarction, unspecified: Secondary | ICD-10-CM | POA: Diagnosis not present

## 2020-05-07 DIAGNOSIS — I10 Essential (primary) hypertension: Secondary | ICD-10-CM

## 2020-05-07 NOTE — Patient Instructions (Signed)
1.  Continue aspirin 81mg  daily 2.  Continue Crestor  And blood pressure medications 3.  Continue exercise 4.  Follow up 6 months

## 2020-05-26 DIAGNOSIS — E1165 Type 2 diabetes mellitus with hyperglycemia: Secondary | ICD-10-CM | POA: Diagnosis not present

## 2020-05-26 DIAGNOSIS — E119 Type 2 diabetes mellitus without complications: Secondary | ICD-10-CM | POA: Diagnosis not present

## 2020-05-26 DIAGNOSIS — R946 Abnormal results of thyroid function studies: Secondary | ICD-10-CM | POA: Diagnosis not present

## 2020-05-26 DIAGNOSIS — E1169 Type 2 diabetes mellitus with other specified complication: Secondary | ICD-10-CM | POA: Diagnosis not present

## 2020-05-26 DIAGNOSIS — E782 Mixed hyperlipidemia: Secondary | ICD-10-CM | POA: Diagnosis not present

## 2020-05-26 DIAGNOSIS — R972 Elevated prostate specific antigen [PSA]: Secondary | ICD-10-CM | POA: Diagnosis not present

## 2020-05-26 DIAGNOSIS — I10 Essential (primary) hypertension: Secondary | ICD-10-CM | POA: Diagnosis not present

## 2020-05-28 DIAGNOSIS — H401113 Primary open-angle glaucoma, right eye, severe stage: Secondary | ICD-10-CM | POA: Diagnosis not present

## 2020-06-04 DIAGNOSIS — H409 Unspecified glaucoma: Secondary | ICD-10-CM | POA: Insufficient documentation

## 2020-06-04 DIAGNOSIS — I6529 Occlusion and stenosis of unspecified carotid artery: Secondary | ICD-10-CM | POA: Insufficient documentation

## 2020-06-04 DIAGNOSIS — I639 Cerebral infarction, unspecified: Secondary | ICD-10-CM | POA: Diagnosis not present

## 2020-06-04 DIAGNOSIS — I10 Essential (primary) hypertension: Secondary | ICD-10-CM | POA: Diagnosis not present

## 2020-06-04 DIAGNOSIS — R809 Proteinuria, unspecified: Secondary | ICD-10-CM | POA: Diagnosis not present

## 2020-06-04 DIAGNOSIS — R946 Abnormal results of thyroid function studies: Secondary | ICD-10-CM | POA: Diagnosis not present

## 2020-06-04 DIAGNOSIS — Z86010 Personal history of colonic polyps: Secondary | ICD-10-CM | POA: Diagnosis not present

## 2020-06-04 DIAGNOSIS — N289 Disorder of kidney and ureter, unspecified: Secondary | ICD-10-CM | POA: Insufficient documentation

## 2020-06-04 DIAGNOSIS — G459 Transient cerebral ischemic attack, unspecified: Secondary | ICD-10-CM | POA: Diagnosis not present

## 2020-06-04 DIAGNOSIS — E1165 Type 2 diabetes mellitus with hyperglycemia: Secondary | ICD-10-CM | POA: Diagnosis not present

## 2020-06-04 DIAGNOSIS — E782 Mixed hyperlipidemia: Secondary | ICD-10-CM | POA: Diagnosis not present

## 2020-06-04 DIAGNOSIS — E119 Type 2 diabetes mellitus without complications: Secondary | ICD-10-CM | POA: Insufficient documentation

## 2020-06-04 DIAGNOSIS — R944 Abnormal results of kidney function studies: Secondary | ICD-10-CM | POA: Diagnosis not present

## 2020-07-09 DIAGNOSIS — H401113 Primary open-angle glaucoma, right eye, severe stage: Secondary | ICD-10-CM | POA: Diagnosis not present

## 2020-08-06 DIAGNOSIS — H401113 Primary open-angle glaucoma, right eye, severe stage: Secondary | ICD-10-CM | POA: Diagnosis not present

## 2020-08-06 DIAGNOSIS — H401121 Primary open-angle glaucoma, left eye, mild stage: Secondary | ICD-10-CM | POA: Diagnosis not present

## 2020-08-20 ENCOUNTER — Encounter: Payer: Self-pay | Admitting: Internal Medicine

## 2020-08-20 ENCOUNTER — Other Ambulatory Visit (INDEPENDENT_AMBULATORY_CARE_PROVIDER_SITE_OTHER): Payer: Medicare Other

## 2020-08-20 ENCOUNTER — Ambulatory Visit (INDEPENDENT_AMBULATORY_CARE_PROVIDER_SITE_OTHER): Payer: Medicare Other | Admitting: Internal Medicine

## 2020-08-20 VITALS — BP 140/70 | HR 74 | Ht 67.0 in | Wt 164.0 lb

## 2020-08-20 DIAGNOSIS — R195 Other fecal abnormalities: Secondary | ICD-10-CM

## 2020-08-20 DIAGNOSIS — Z86010 Personal history of colonic polyps: Secondary | ICD-10-CM

## 2020-08-20 DIAGNOSIS — I639 Cerebral infarction, unspecified: Secondary | ICD-10-CM

## 2020-08-20 DIAGNOSIS — K5901 Slow transit constipation: Secondary | ICD-10-CM | POA: Diagnosis not present

## 2020-08-20 LAB — CBC
HCT: 46 % (ref 39.0–52.0)
Hemoglobin: 15.5 g/dL (ref 13.0–17.0)
MCHC: 33.7 g/dL (ref 30.0–36.0)
MCV: 97.8 fl (ref 78.0–100.0)
Platelets: 171 10*3/uL (ref 150.0–400.0)
RBC: 4.7 Mil/uL (ref 4.22–5.81)
RDW: 13.1 % (ref 11.5–15.5)
WBC: 8.6 10*3/uL (ref 4.0–10.5)

## 2020-08-20 NOTE — Progress Notes (Signed)
HISTORY OF PRESENT ILLNESS:  Stephen [name] is a 85 y.o. male with multiple medical problems as listed below who presents today for evaluation of dark stools.  He is concerned about GI bleeding.  He has a history of slow transit constipation, multiple adenomatous colon polyps, and general medical problems as listed.  Patient was last seen in this office May 2019 regarding constipation and the need for surveillance colonoscopy.  He underwent complete colonoscopy July 20, 2017.  He was found to have 2 diminutive polyps, diverticulosis, and hemorrhoids.  No routine follow-up recommended due to age.  He has also undergone prior upper endoscopy 2016.  This was normal.  Patient tells me that he noticed dark stools after taking a new vitamin supplement.  He is on multiple vitamins and supplements as listed.  He denies loose stools, hematochezia, abdominal pain, weight loss, or any other GI complaints.  His hemoglobin from September 2021 was 15.8.  Abdominal films from 2019 showed increased stool in the colon.  He does tell me that MiraLAX has significantly helped his constipation.  REVIEW OF SYSTEMS:  All non-GI ROS negative unless otherwise stated in the HPI except for arthritis  Past Medical History:  Diagnosis Date   Adenomatous colon polyp    Allergy    Arthritis    BPH (benign prostatic hypertrophy)    Cataract    bil cataracts removed   Chest pain    CKD (chronic kidney disease)    Diabetes mellitus without complication ([place])    controlled with diet   Diverticulosis    GERD (gastroesophageal reflux disease)    Glaucoma    Helicobacter pylori (H. pylori)    Hemorrhoids    Hyperlipidemia    Hypertension    Kidney stones    TIA (transient ischemic attack)    "side affect from medication had symptoms of TIA"   UTI (lower urinary tract infection)     Past Surgical History:  Procedure Laterality Date   CATARACT EXTRACTION Bilateral    COLONOSCOPY     CYSTOSCOPY WITH STENT PLACEMENT  Left 05/01/2012   Procedure: CYSTOSCOPY WITH URETEROSCOPY AND STONE EXTRACTION, LEFT STENT PLACEMENT ;  Surgeon: Malka So, MD;  Location: [facility];  Service: Urology;  Laterality: Left;  STONE EXTRACTION  STENT PLACEMENT     ESOPHAGOGASTRODUODENOSCOPY ENDOSCOPY     LITHOTRIPSY      Social History Stephen [name]  reports that he has never smoked. He has never used smokeless tobacco. He reports that he does not drink alcohol and does not use drugs.  family history includes Colon cancer in his brother; Colon polyps in his brother; Diabetes in his brother; Heart attack in his father; Heart disease in his brother; Lung cancer in his brother; Pancreatic cancer in his brother; Prostate cancer in his brother.  Allergies  Allergen Reactions   Levofloxacin Other (See Comments)    Severe headache   Benicar Hct [Olmesartan Medoxomil-Hctz] Other (See Comments)    Renal damage   Celebrex [Celecoxib] Other (See Comments)    confusion   Combigan [Brimonidine Tartrate-Timolol] Other (See Comments)    Eye irritation    Hydralazine Other (See Comments)    Unknown    Metronidazole Other (See Comments)    Headache   Propoxyphene Napsylate [Propoxyphene] Other (See Comments)    With Acetaminophen, severe headache and trouble urinating   Clarithromycin Rash    Bad Headache       PHYSICAL EXAMINATION: Vital signs: BP 140/70  Pulse 74   Ht '5\' 7"'$  (1.702 m)   Wt 164 lb (74.4 kg)   SpO2 97%   BMI 25.69 kg/m   Constitutional: generally well-appearing, no acute distress Psychiatric: alert and oriented x3, cooperative Eyes: extraocular movements intact, anicteric, conjunctiva pink Mouth: oral pharynx moist, no lesions Neck: supple no lymphadenopathy Cardiovascular: heart regular rate and rhythm, no murmur Lungs: clear to auscultation bilaterally Abdomen: soft, nontender, nondistended, no obvious ascites, no peritoneal signs, normal bowel sounds, no organomegaly Rectal: No external  abnormalities.  No mass or tenderness.  Dark brown Hemoccult negative stool Extremities: no lower extremity edema bilaterally Skin: no lesions on visible extremities Neuro: No focal deficits.  Cranial nerves intact  ASSESSMENT:  1.  Dark stool likely secondary to vitamin supplements.  Hemoccult negative today. 2.  History of multiple adenomatous colon polyps.  Aged out of surveillance 3.  Slow transit constipation.  Improved with MiraLAX 4.  General medical problems  PLAN:  1.  Reassurance 2.  Check CBC today to rule out significant interval change 3.  Continue MiraLAX 4.  Resume general medical care with PCP.  GI follow-up as needed Addendum: Hemoglobin has returned at 15.5.  No change from previous.  Patient notified  A total time of 45 minutes was spent preparing to see the patient, reviewing tests and endoscopic ports, obtaining interval comprehensive history, performing medically appropriate physical examination, performing stool Hemoccult study, counseling the patient regarding the above listed issues, ordering blood work, and documenting clinical information in the health record

## 2020-08-20 NOTE — Patient Instructions (Addendum)
If you are age 83 or older, your body mass index should be between 23-30. Your Body mass index is 25.69 kg/m. If this is out of the aforementioned range listed, please consider follow up with your Primary Care Provider. __________________________________________________________  The [facility] providers would like to encourage you to use [facility] to communicate with providers for non-urgent requests or questions.  Due to long hold times on the telephone, sending your provider a message by [facility] may be a faster and more efficient way to get a response.  Please allow 48 business hours for a response.  Please remember that this is for non-urgent requests.   Your provider has requested that you go to the basement level for lab work before leaving today. Press "B" on the elevator. The lab is located at the first door on the left as you exit the elevator.  Due to recent changes in healthcare laws, you may see the results of your imaging and laboratory studies on MyChart before your provider has had a chance to review them.  We understand that in some cases there may be results that are confusing or concerning to you. Not all laboratory results come back in the same time frame and the provider may be waiting for multiple results in order to interpret others.  Please give [place] 48 hours in order for your provider to thoroughly review all the results before contacting the office for clarification of your results.   Thank you for entrusting me with your care and choosing [facility].  Dr Henrene Pastor

## 2020-08-21 DIAGNOSIS — H401113 Primary open-angle glaucoma, right eye, severe stage: Secondary | ICD-10-CM | POA: Diagnosis not present

## 2020-08-21 DIAGNOSIS — H401121 Primary open-angle glaucoma, left eye, mild stage: Secondary | ICD-10-CM | POA: Diagnosis not present

## 2020-08-26 ENCOUNTER — Telehealth: Payer: Self-pay | Admitting: Internal Medicine

## 2020-08-26 NOTE — Telephone Encounter (Signed)
Patient called would like to discuss the last office note that was provided to him because he said it has some medications listed that he is not aware of ever being on.

## 2020-08-26 NOTE — Telephone Encounter (Signed)
Patient reviewed his med list on his after visit summary from last visit and he did not recall taking sodium chloride.  Patient was advised that was used during procedure in June 2019 and should be removed from his medication list.  All questions answered and patient thanked me for the call.  Sodium chloride removed from list.  No further questions.

## 2020-09-10 DIAGNOSIS — I10 Essential (primary) hypertension: Secondary | ICD-10-CM | POA: Diagnosis not present

## 2020-09-10 DIAGNOSIS — E1165 Type 2 diabetes mellitus with hyperglycemia: Secondary | ICD-10-CM | POA: Diagnosis not present

## 2020-09-17 DIAGNOSIS — I10 Essential (primary) hypertension: Secondary | ICD-10-CM | POA: Diagnosis not present

## 2020-09-17 DIAGNOSIS — E1165 Type 2 diabetes mellitus with hyperglycemia: Secondary | ICD-10-CM | POA: Diagnosis not present

## 2020-09-17 DIAGNOSIS — I6529 Occlusion and stenosis of unspecified carotid artery: Secondary | ICD-10-CM | POA: Diagnosis not present

## 2020-09-17 DIAGNOSIS — E782 Mixed hyperlipidemia: Secondary | ICD-10-CM | POA: Diagnosis not present

## 2020-09-17 DIAGNOSIS — H409 Unspecified glaucoma: Secondary | ICD-10-CM | POA: Diagnosis not present

## 2020-09-17 DIAGNOSIS — R809 Proteinuria, unspecified: Secondary | ICD-10-CM | POA: Diagnosis not present

## 2020-09-17 DIAGNOSIS — G459 Transient cerebral ischemic attack, unspecified: Secondary | ICD-10-CM | POA: Diagnosis not present

## 2020-09-17 DIAGNOSIS — Z86010 Personal history of colonic polyps: Secondary | ICD-10-CM | POA: Diagnosis not present

## 2020-09-17 DIAGNOSIS — R946 Abnormal results of thyroid function studies: Secondary | ICD-10-CM | POA: Diagnosis not present

## 2020-09-17 DIAGNOSIS — R944 Abnormal results of kidney function studies: Secondary | ICD-10-CM | POA: Diagnosis not present

## 2020-09-17 DIAGNOSIS — I639 Cerebral infarction, unspecified: Secondary | ICD-10-CM | POA: Diagnosis not present

## 2020-11-09 NOTE — Progress Notes (Deleted)
NEUROLOGY FOLLOW UP OFFICE NOTE  TRAVARES NELLES [id number]  Assessment/Plan:   Right hemispheric stroke, cryptogenic Hypertension Hyperlipidemia Type 2 diabetes mellitus  1  Secondary stroke prevention as managed by PCP: - ASA 81mg  daily - Statin.  LDL goal less than 70 - Hgb A1c goal less than 7 - Normotensive blood pressure 2  Exercise 3  Mediterranean diet 4  Follow up as needed  Subjective:  Jeneen Rinks A. Wrightsman is an 85 year old left-handed male with HTN, HLD, CKD and diabetes who follows up for stroke.   UPDATE: Current medications:  ASA 81mg , amlodipine, Lopressor, Crestor 10mg , Omega 3-6-9 fatty acids      HISTORY: On 10/05/2019, he developed slurred speech, also had trouble moving his left hand when writing.  He was also unsteady of his feet.  He had a mild headache as well. No associated language deficits, vision loss, dizziness, or numbness.  Headache and unsteadiness resolved by the next day and slurred speech improved but still persisted.  CT head personally reviewed revealed no acute intracranial abnormality.  CTA of head and neck showed moderate focal left A2 stenosis and moderate/severe right P2 stenosis with patent bilateral ICAs with 40-50% stenosis of right carotid bulb and up to 40% stenosis within left carotid bulb.  He was on ASA prior to presentation and discharged on ASA with Plavix for 30 days followed by Plavix alone.  He was advised to follow up with his PCP for an echocardiogram.  Slurred speech persisted for another 3 days after hospital discharge.    Underwent stroke workup: MRI of brain without contrast on 11/12/2019 personally reviewed showed 15 mm subacute infarct within the right centrum semiovale/corona radiata.  Echocardiogram on 11/11/2019 showed LVEF 65-70% with negative bubble study and no significant valvular abnormalities.  14 day ZIO patch cardiac monitoring in November 2021 demonstrated occasional supraventricular and vetricular ectopy but  no atrial fibrillation.     He stopped the Plavix because it caused flushing.     He had a TIA back in 2010.  He was evaluated by neurologist, Dr. Leonie Man, in 2015 for mild heterogenous plaque at the carotid bifurcations bilaterally.   Allergic Reaction:  Plavix (flushing, paresthesias)  PAST MEDICAL HISTORY: Past Medical History:  Diagnosis Date   Adenomatous colon polyp    Allergy    Arthritis    BPH (benign prostatic hypertrophy)    Cataract    bil cataracts removed   Chest pain    CKD (chronic kidney disease)    Diabetes mellitus without complication ([place])    controlled with diet   Diverticulosis    GERD (gastroesophageal reflux disease)    Glaucoma    Helicobacter pylori (H. pylori)    Hemorrhoids    Hyperlipidemia    Hypertension    Kidney stones    TIA (transient ischemic attack)    "side affect from medication had symptoms of TIA"   UTI (lower urinary tract infection)     MEDICATIONS: Current Outpatient Medications on File Prior to Visit  Medication Sig Dispense Refill   Alpha-Lipoic Acid 200 MG CAPS Take 1 capsule by mouth daily.     AMBULATORY NON FORMULARY MEDICATION Flaxseed Meal 1/4 cup 3-4 times/week     AMBULATORY NON FORMULARY MEDICATION Healthy Prostate  Take 1 tablet by mouth twice daily     amLODipine (NORVASC) 10 MG tablet Take 1 tablet (10 mg total) by mouth every evening. 30 tablet 2   Ascorbic Acid (VITAMIN C) 500 MG CAPS  Take 500 mg by mouth 2 (two) times daily.      aspirin 325 MG tablet Take 325 mg by mouth daily.     ASTAXANTHIN PO Take 1 tablet by mouth daily.     Berberine Chloride POWD Take 500 mg by mouth 2 (two) times daily with a meal.     Cholecalciferol (VITAMIN D3) 5000 UNITS TABS Take 1 tablet by mouth daily.     Coenzyme Q10 (CO Q 10) 100 MG CAPS Take 1 capsule by mouth 2 (two) times daily.     Cyanocobalamin (VITAMIN B12) 3000 MCG/ML LIQD Place 1 tablet under the tongue daily.     Magnesium 500 MG TABS Take 500 mg by mouth daily.      metoprolol tartrate (LOPRESSOR) 50 MG tablet Take 1 tablet (50 mg total) by mouth 2 (two) times daily. 60 tablet 3   Misc Natural Products (BLACK CHERRY CONCENTRATE PO) Take 1 capsule by mouth in the morning and at bedtime.     Multiple Vitamin (MULTIVITAMIN WITH MINERALS) TABS tablet Take 1 tablet by mouth daily.     niacin (NIASPAN) 1000 MG CR tablet Take 1,000 mg by mouth at bedtime.     NON FORMULARY Advance Memory Formula     Omega 3-6-9 Fatty Acids (TRIPLE OMEGA-3-6-9) CAPS Take 1 capsule by mouth 2 (two) times daily.     OVER THE COUNTER MEDICATION Multiple over the counter medications.     rosuvastatin (CRESTOR) 10 MG tablet Take 1 tablet (10 mg total) by mouth at bedtime. For stroke prevention 30 tablet 3   timolol (BETIMOL) 0.5 % ophthalmic solution Place 1 drop into both eyes 2 (two) times daily.      timolol (TIMOPTIC) 0.5 % ophthalmic solution      vitamin E 180 MG (400 UNITS) capsule Take 400 Units by mouth daily.     No current facility-administered medications on file prior to visit.    ALLERGIES: Allergies  Allergen Reactions   Levofloxacin Other (See Comments)    Severe headache   Benicar Hct [Olmesartan Medoxomil-Hctz] Other (See Comments)    Renal damage   Celebrex [Celecoxib] Other (See Comments)    confusion   Combigan [Brimonidine Tartrate-Timolol] Other (See Comments)    Eye irritation    Hydralazine Other (See Comments)    Unknown    Metronidazole Other (See Comments)    Headache   Propoxyphene Napsylate [Propoxyphene] Other (See Comments)    With Acetaminophen, severe headache and trouble urinating   Clarithromycin Rash    Bad Headache    FAMILY HISTORY: Family History  Problem Relation Age of Onset   Heart attack Father    Pancreatic cancer Brother    Colon polyps Brother    Lung cancer Brother    Heart disease Brother    Colon cancer Brother    Prostate cancer Brother    Diabetes Brother        x 3 broderline DM   Esophageal cancer Neg  Hx    Stomach cancer Neg Hx       Objective:  *** General: No acute distress.  Patient appears ***-groomed.   Head:  Normocephalic/atraumatic Eyes:  Fundi examined but not visualized Neck: supple, no paraspinal tenderness, full range of motion Heart:  Regular rate and rhythm Lungs:  Clear to auscultation bilaterally Back: No paraspinal tenderness Neurological Exam: alert and oriented to person, place, and time.  Speech fluent and not dysarthric, language intact.  CN II-XII intact. Bulk and tone normal,  muscle strength 5/5 throughout.  Sensation to light touch intact.  Deep tendon reflexes 2+ throughout, toes downgoing.  Finger to nose testing intact.  Gait normal, Romberg negative.   Metta Clines, DO  CC: ***

## 2020-11-10 ENCOUNTER — Ambulatory Visit: Payer: Medicare Other | Admitting: Neurology

## 2020-11-20 NOTE — Progress Notes (Signed)
NEUROLOGY FOLLOW UP OFFICE NOTE  Stephen [name] [id number]  Assessment/Plan:   Right hemispheric stroke, cryptogenic Hypertension -reports it is usually never elevated. Hyperlipidemia Type 2 diabetes mellitus  1  Secondary stroke prevention as managed by PCP: - ASA 81mg  daily - Statin.  LDL goal less than 70 - Hgb A1c goal less than 7 - Normotensive blood pressure - follow up with PCP 2  Exercise 3  Mediterranean diet 4  Follow up as needed  Subjective:  Stephen [name] is an 85 year old left-handed male with HTN, HLD, CKD and diabetes who follows up for stroke.   UPDATE: Current medications:  ASA 81mg , amlodipine, Lopressor, Crestor 10mg , Omega 3-6-9 fatty acids   He is feeling well.  He exercises regularly on the treadmill and light resistance.  He has his annual carotid ultrasound coming up.  Blood pressure high today.  He says this is not typical and that he checks at home and it is always 120s-130s/70s-80s.     HISTORY: On 10/05/2019, he developed slurred speech, also had trouble moving his left hand when writing.  He was also unsteady of his feet.  He had a mild headache as well. No associated language deficits, vision loss, dizziness, or numbness.  Headache and unsteadiness resolved by the next day and slurred speech improved but still persisted.  CT head personally reviewed revealed no acute intracranial abnormality.  CTA of head and neck showed moderate focal left A2 stenosis and moderate/severe right P2 stenosis with patent bilateral ICAs with 40-50% stenosis of right carotid bulb and up to 40% stenosis within left carotid bulb.  He was on ASA prior to presentation and discharged on ASA with Plavix for 30 days followed by Plavix alone.  He was advised to follow up with his PCP for an echocardiogram.  Slurred speech persisted for another 3 days after hospital discharge.    Underwent stroke workup: MRI of brain without contrast on 11/12/2019 personally reviewed showed  15 mm subacute infarct within the right centrum semiovale/corona radiata.  Echocardiogram on 11/11/2019 showed LVEF 65-70% with negative bubble study and no significant valvular abnormalities.  14 day ZIO patch cardiac monitoring in November 2021 demonstrated occasional supraventricular and vetricular ectopy but no atrial fibrillation.     He stopped the Plavix because it caused flushing.     He had a TIA back in 2010.  He was evaluated by neurologist, Dr. Leonie Man, in 2015 for mild heterogenous plaque at the carotid bifurcations bilaterally.   Allergic Reaction:  Plavix (flushing, paresthesias)  PAST MEDICAL HISTORY: Past Medical History:  Diagnosis Date   Adenomatous colon polyp    Allergy    Arthritis    BPH (benign prostatic hypertrophy)    Cataract    bil cataracts removed   Chest pain    CKD (chronic kidney disease)    Diabetes mellitus without complication ([place])    controlled with diet   Diverticulosis    GERD (gastroesophageal reflux disease)    Glaucoma    Helicobacter pylori (H. pylori)    Hemorrhoids    Hyperlipidemia    Hypertension    Kidney stones    TIA (transient ischemic attack)    "side affect from medication had symptoms of TIA"   UTI (lower urinary tract infection)     MEDICATIONS: Current Outpatient Medications on File Prior to Visit  Medication Sig Dispense Refill   Alpha-Lipoic Acid 200 MG CAPS Take 1 capsule by mouth daily.     AMBULATORY  NON FORMULARY MEDICATION Flaxseed Meal 1/4 cup 3-4 times/week     AMBULATORY NON FORMULARY MEDICATION Healthy Prostate  Take 1 tablet by mouth twice daily     amLODipine (NORVASC) 10 MG tablet Take 1 tablet (10 mg total) by mouth every evening. 30 tablet 2   Ascorbic Acid (VITAMIN C) 500 MG CAPS Take 500 mg by mouth 2 (two) times daily.      aspirin 325 MG tablet Take 325 mg by mouth daily.     ASTAXANTHIN PO Take 1 tablet by mouth daily.     Berberine Chloride POWD Take 500 mg by mouth 2 (two) times daily with a  meal.     Cholecalciferol (VITAMIN D3) 5000 UNITS TABS Take 1 tablet by mouth daily.     Coenzyme Q10 (CO Q 10) 100 MG CAPS Take 1 capsule by mouth 2 (two) times daily.     Cyanocobalamin (VITAMIN B12) 3000 MCG/ML LIQD Place 1 tablet under the tongue daily.     Magnesium 500 MG TABS Take 500 mg by mouth daily.     metoprolol tartrate (LOPRESSOR) 50 MG tablet Take 1 tablet (50 mg total) by mouth 2 (two) times daily. 60 tablet 3   Misc Natural Products (BLACK CHERRY CONCENTRATE PO) Take 1 capsule by mouth in the morning and at bedtime.     Multiple Vitamin (MULTIVITAMIN WITH MINERALS) TABS tablet Take 1 tablet by mouth daily.     niacin (NIASPAN) 1000 MG CR tablet Take 1,000 mg by mouth at bedtime.     NON FORMULARY Advance Memory Formula     Omega 3-6-9 Fatty Acids (TRIPLE OMEGA-3-6-9) CAPS Take 1 capsule by mouth 2 (two) times daily.     OVER THE COUNTER MEDICATION Multiple over the counter medications.     rosuvastatin (CRESTOR) 10 MG tablet Take 1 tablet (10 mg total) by mouth at bedtime. For stroke prevention 30 tablet 3   timolol (BETIMOL) 0.5 % ophthalmic solution Place 1 drop into both eyes 2 (two) times daily.      timolol (TIMOPTIC) 0.5 % ophthalmic solution      vitamin E 180 MG (400 UNITS) capsule Take 400 Units by mouth daily.     No current facility-administered medications on file prior to visit.    ALLERGIES: Allergies  Allergen Reactions   Levofloxacin Other (See Comments)    Severe headache   Benicar Hct [Olmesartan Medoxomil-Hctz] Other (See Comments)    Renal damage   Celebrex [Celecoxib] Other (See Comments)    confusion   Combigan [Brimonidine Tartrate-Timolol] Other (See Comments)    Eye irritation    Hydralazine Other (See Comments)    Unknown    Metronidazole Other (See Comments)    Headache   Propoxyphene Napsylate [Propoxyphene] Other (See Comments)    With Acetaminophen, severe headache and trouble urinating   Clarithromycin Rash    Bad Headache     FAMILY HISTORY: Family History  Problem Relation Age of Onset   Heart attack Father    Pancreatic cancer Brother    Colon polyps Brother    Lung cancer Brother    Heart disease Brother    Colon cancer Brother    Prostate cancer Brother    Diabetes Brother        x 3 broderline DM   Esophageal cancer Neg Hx    Stomach cancer Neg Hx       Objective:  Blood pressure (!) 195/77, pulse (!) 54, height 5\' 7"  (1.702 m), weight 163  lb (73.9 kg), SpO2 98 %. General: No acute distress.  Patient appears well-groomed.   Head:  Normocephalic/atraumatic Eyes:  Fundi examined but not visualized Neck: supple, no paraspinal tenderness, full range of motion Heart:  Regular rate and rhythm Lungs:  Clear to auscultation bilaterally Back: No paraspinal tenderness Neurological Exam: alert and oriented to person, place, and time.  Speech fluent and not dysarthric, language intact.  CN II-XII intact. Bulk and tone normal, muscle strength 5/5 throughout.  Sensation to light touch intact.  Deep tendon reflexes 2+ throughout, toes downgoing.  Finger to nose testing intact.  Gait normal, Romberg negative.   Metta Clines, DO  CC: Stephen Kenner, MD

## 2020-11-23 ENCOUNTER — Encounter: Payer: Self-pay | Admitting: Neurology

## 2020-11-23 ENCOUNTER — Ambulatory Visit (INDEPENDENT_AMBULATORY_CARE_PROVIDER_SITE_OTHER): Payer: Medicare Other | Admitting: Neurology

## 2020-11-23 ENCOUNTER — Other Ambulatory Visit: Payer: Self-pay

## 2020-11-23 VITALS — BP 195/77 | HR 54 | Ht 67.0 in | Wt 163.0 lb

## 2020-11-23 DIAGNOSIS — E785 Hyperlipidemia, unspecified: Secondary | ICD-10-CM | POA: Diagnosis not present

## 2020-11-23 DIAGNOSIS — I10 Essential (primary) hypertension: Secondary | ICD-10-CM

## 2020-11-23 DIAGNOSIS — E119 Type 2 diabetes mellitus without complications: Secondary | ICD-10-CM

## 2020-11-23 DIAGNOSIS — I639 Cerebral infarction, unspecified: Secondary | ICD-10-CM | POA: Diagnosis not present

## 2020-11-23 NOTE — Patient Instructions (Signed)
Continue aspirin 81mg  daily, rosuvastatin, and blood pressure medications Follow up as needed

## 2020-11-27 DIAGNOSIS — H401112 Primary open-angle glaucoma, right eye, moderate stage: Secondary | ICD-10-CM | POA: Diagnosis not present

## 2020-11-27 DIAGNOSIS — H401121 Primary open-angle glaucoma, left eye, mild stage: Secondary | ICD-10-CM | POA: Diagnosis not present

## 2020-11-27 DIAGNOSIS — H26492 Other secondary cataract, left eye: Secondary | ICD-10-CM | POA: Diagnosis not present

## 2021-01-26 DIAGNOSIS — E1165 Type 2 diabetes mellitus with hyperglycemia: Secondary | ICD-10-CM | POA: Diagnosis not present

## 2021-01-26 DIAGNOSIS — R946 Abnormal results of thyroid function studies: Secondary | ICD-10-CM | POA: Diagnosis not present

## 2021-01-26 DIAGNOSIS — E782 Mixed hyperlipidemia: Secondary | ICD-10-CM | POA: Diagnosis not present

## 2021-01-27 DIAGNOSIS — R946 Abnormal results of thyroid function studies: Secondary | ICD-10-CM | POA: Diagnosis not present

## 2021-01-27 DIAGNOSIS — G459 Transient cerebral ischemic attack, unspecified: Secondary | ICD-10-CM | POA: Diagnosis not present

## 2021-01-27 DIAGNOSIS — Z125 Encounter for screening for malignant neoplasm of prostate: Secondary | ICD-10-CM | POA: Diagnosis not present

## 2021-01-27 DIAGNOSIS — R809 Proteinuria, unspecified: Secondary | ICD-10-CM | POA: Diagnosis not present

## 2021-01-27 DIAGNOSIS — H409 Unspecified glaucoma: Secondary | ICD-10-CM | POA: Diagnosis not present

## 2021-01-27 DIAGNOSIS — N182 Chronic kidney disease, stage 2 (mild): Secondary | ICD-10-CM | POA: Diagnosis not present

## 2021-01-27 DIAGNOSIS — E1165 Type 2 diabetes mellitus with hyperglycemia: Secondary | ICD-10-CM | POA: Diagnosis not present

## 2021-01-27 DIAGNOSIS — Z86010 Personal history of colonic polyps: Secondary | ICD-10-CM | POA: Diagnosis not present

## 2021-01-27 DIAGNOSIS — I639 Cerebral infarction, unspecified: Secondary | ICD-10-CM | POA: Diagnosis not present

## 2021-01-27 DIAGNOSIS — I10 Essential (primary) hypertension: Secondary | ICD-10-CM | POA: Diagnosis not present

## 2021-01-27 DIAGNOSIS — I6529 Occlusion and stenosis of unspecified carotid artery: Secondary | ICD-10-CM | POA: Diagnosis not present

## 2021-01-27 DIAGNOSIS — E782 Mixed hyperlipidemia: Secondary | ICD-10-CM | POA: Diagnosis not present

## 2021-03-09 DIAGNOSIS — L812 Freckles: Secondary | ICD-10-CM | POA: Diagnosis not present

## 2021-03-09 DIAGNOSIS — D2272 Melanocytic nevi of left lower limb, including hip: Secondary | ICD-10-CM | POA: Diagnosis not present

## 2021-03-09 DIAGNOSIS — D2271 Melanocytic nevi of right lower limb, including hip: Secondary | ICD-10-CM | POA: Diagnosis not present

## 2021-03-09 DIAGNOSIS — Z85828 Personal history of other malignant neoplasm of skin: Secondary | ICD-10-CM | POA: Diagnosis not present

## 2021-03-09 DIAGNOSIS — L821 Other seborrheic keratosis: Secondary | ICD-10-CM | POA: Diagnosis not present

## 2021-03-09 DIAGNOSIS — D225 Melanocytic nevi of trunk: Secondary | ICD-10-CM | POA: Diagnosis not present

## 2021-03-29 DIAGNOSIS — H401121 Primary open-angle glaucoma, left eye, mild stage: Secondary | ICD-10-CM | POA: Diagnosis not present

## 2021-03-29 DIAGNOSIS — H11041 Peripheral pterygium, stationary, right eye: Secondary | ICD-10-CM | POA: Diagnosis not present

## 2021-03-29 DIAGNOSIS — H401112 Primary open-angle glaucoma, right eye, moderate stage: Secondary | ICD-10-CM | POA: Diagnosis not present

## 2021-06-25 DIAGNOSIS — R946 Abnormal results of thyroid function studies: Secondary | ICD-10-CM | POA: Diagnosis not present

## 2021-06-25 DIAGNOSIS — E782 Mixed hyperlipidemia: Secondary | ICD-10-CM | POA: Diagnosis not present

## 2021-06-25 DIAGNOSIS — Z125 Encounter for screening for malignant neoplasm of prostate: Secondary | ICD-10-CM | POA: Diagnosis not present

## 2021-06-25 DIAGNOSIS — E1165 Type 2 diabetes mellitus with hyperglycemia: Secondary | ICD-10-CM | POA: Diagnosis not present

## 2021-06-30 DIAGNOSIS — N182 Chronic kidney disease, stage 2 (mild): Secondary | ICD-10-CM | POA: Diagnosis not present

## 2021-06-30 DIAGNOSIS — I639 Cerebral infarction, unspecified: Secondary | ICD-10-CM | POA: Diagnosis not present

## 2021-06-30 DIAGNOSIS — R946 Abnormal results of thyroid function studies: Secondary | ICD-10-CM | POA: Diagnosis not present

## 2021-06-30 DIAGNOSIS — Z86010 Personal history of colonic polyps: Secondary | ICD-10-CM | POA: Diagnosis not present

## 2021-06-30 DIAGNOSIS — E1165 Type 2 diabetes mellitus with hyperglycemia: Secondary | ICD-10-CM | POA: Diagnosis not present

## 2021-06-30 DIAGNOSIS — R809 Proteinuria, unspecified: Secondary | ICD-10-CM | POA: Diagnosis not present

## 2021-06-30 DIAGNOSIS — G459 Transient cerebral ischemic attack, unspecified: Secondary | ICD-10-CM | POA: Diagnosis not present

## 2021-06-30 DIAGNOSIS — Z125 Encounter for screening for malignant neoplasm of prostate: Secondary | ICD-10-CM | POA: Diagnosis not present

## 2021-06-30 DIAGNOSIS — I6529 Occlusion and stenosis of unspecified carotid artery: Secondary | ICD-10-CM | POA: Diagnosis not present

## 2021-06-30 DIAGNOSIS — H409 Unspecified glaucoma: Secondary | ICD-10-CM | POA: Diagnosis not present

## 2021-06-30 DIAGNOSIS — I10 Essential (primary) hypertension: Secondary | ICD-10-CM | POA: Diagnosis not present

## 2021-06-30 DIAGNOSIS — E782 Mixed hyperlipidemia: Secondary | ICD-10-CM | POA: Diagnosis not present

## 2021-08-03 DIAGNOSIS — H11041 Peripheral pterygium, stationary, right eye: Secondary | ICD-10-CM | POA: Diagnosis not present

## 2021-08-03 DIAGNOSIS — H401112 Primary open-angle glaucoma, right eye, moderate stage: Secondary | ICD-10-CM | POA: Diagnosis not present

## 2021-08-03 DIAGNOSIS — Z961 Presence of intraocular lens: Secondary | ICD-10-CM | POA: Diagnosis not present

## 2021-08-03 DIAGNOSIS — H524 Presbyopia: Secondary | ICD-10-CM | POA: Diagnosis not present

## 2021-08-03 DIAGNOSIS — H401121 Primary open-angle glaucoma, left eye, mild stage: Secondary | ICD-10-CM | POA: Diagnosis not present

## 2021-08-14 IMAGING — MR MR HEAD W/O CM
13 series · 48 of 48 positions shown · non-contrast
Comparison: CT angiogram head/neck 10/06/2019. Noncontrast head CT
10/06/2019. Brain MRI 07/02/2013.

CLINICAL DATA: TIA (transient ischemic attack). Primary
hypertension. Hyperlipidemia, unspecified hyperlipidemia type. Type
2 diabetes mellitus without complication, without long-term current
use of insulin. Additional history provided by technologist: Patient
reports recent TIA 3 weeks ago, hypertension.

EXAM:
MRI HEAD WITHOUT CONTRAST
TECHNIQUE: Multiplanar, multiecho pulse sequences of the brain and surrounding
structures were obtained without intravenous contrast.

[Series 5: DWI · axial · 3.0mm · 0.80mm/px · z∈[-73,+73]mm · 3 of 50 slices shown (1 of 4)]
[im 1/50]
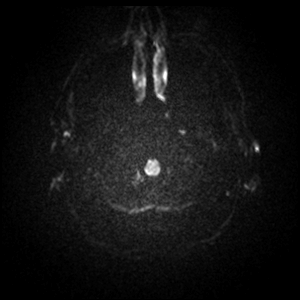
[im 25/50]
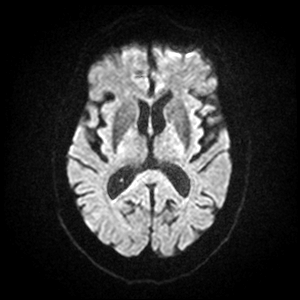
[im 50/50]
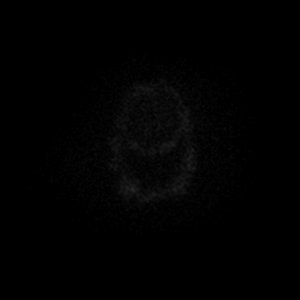

[Series 6: DWI · axial · 3.0mm · 0.80mm/px · z∈[-73,+73]mm · 4 of 50 slices shown (2 of 4)]
[im 1/50]
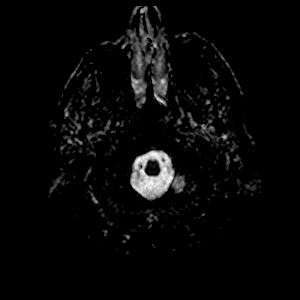
[im 17/50]
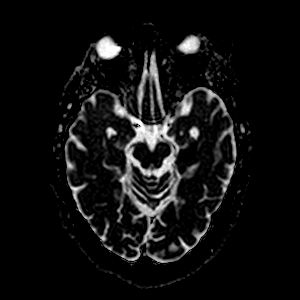
[im 33/50]
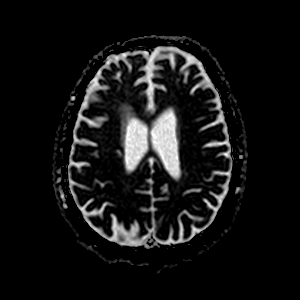
[im 50/50]
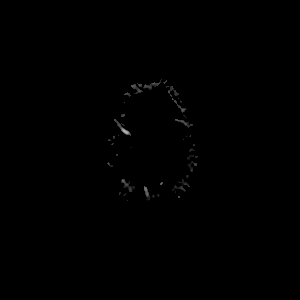

[Series 7: DWI · coronal · 5.0mm · 0.88mm/px · 2 of 30 slices shown (3 of 4)]
[im 1/30]
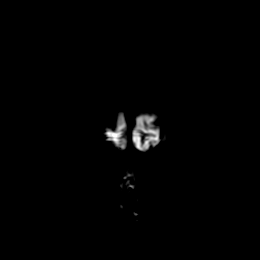
[im 30/30]
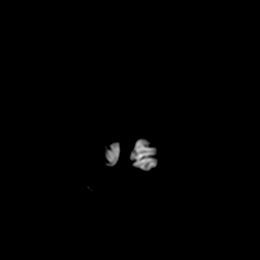

[Series 8: DWI · coronal · 5.0mm · 0.88mm/px · 2 of 30 slices shown (4 of 4)]
[im 1/30]
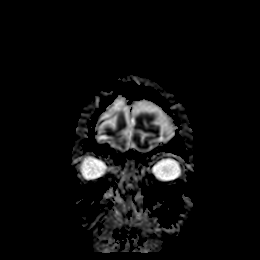
[im 30/30]
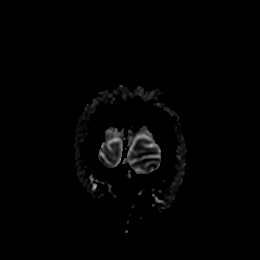

[Series 9: T1 · sagittal · 5.0mm · 0.75mm/px · 2 of 21 slices shown (1 of 2)]
[im 1/21]
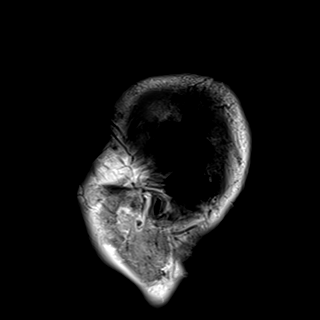
[im 21/21]
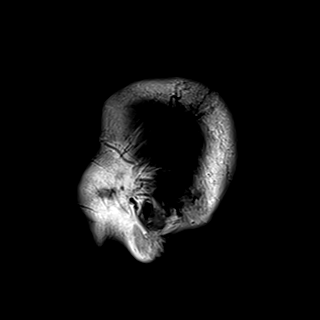

[Series 10: T2 · axial · 5.0mm · 0.75mm/px · z∈[-76,+76]mm · 2 of 23 slices shown (1 of 2)]
[im 1/23]
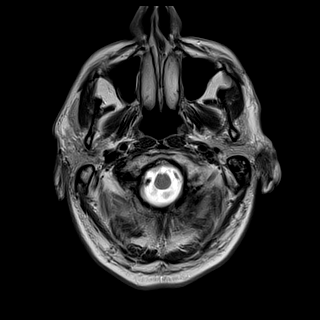
[im 23/23]
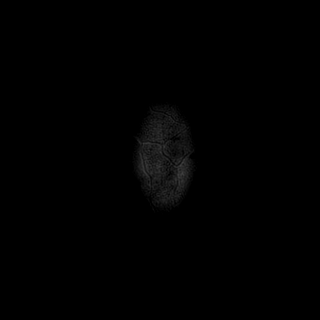

[Series 11: mag_images · axial · 3.0mm · 0.90mm/px · z∈[-85,+90]mm · 4 of 60 slices shown]
[im 1/60]
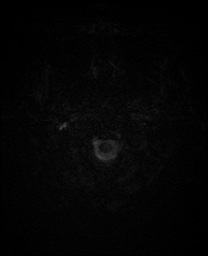
[im 20/60]
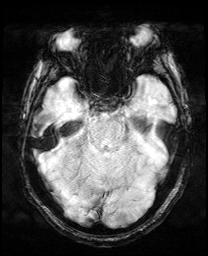
[im 40/60]
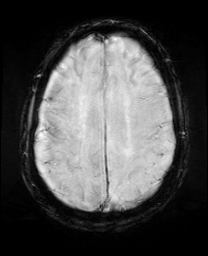
[im 60/60]
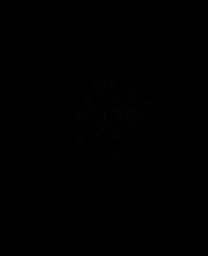

[Series 12: pha_images · axial · 3.0mm · 0.90mm/px · z∈[-85,+81]mm · 4 of 57 slices shown]
[im 1/57]
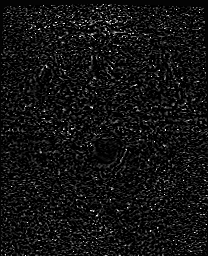
[im 19/57]
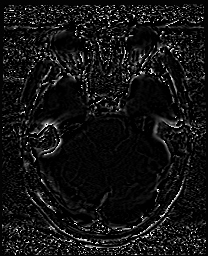
[im 38/57]
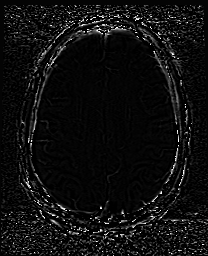
[im 57/57]
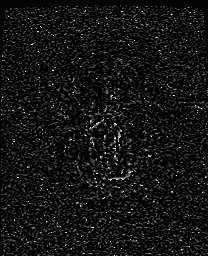

[Series 13: swi_images · axial · 3.0mm · 0.90mm/px · z∈[-85,+90]mm · 4 of 60 slices shown]
[im 1/60]
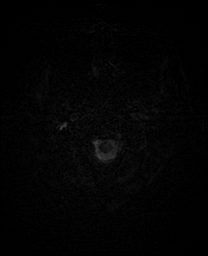
[im 20/60]
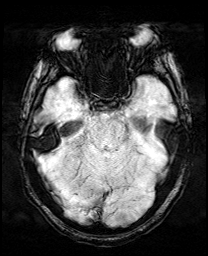
[im 40/60]
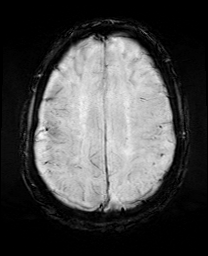
[im 60/60]
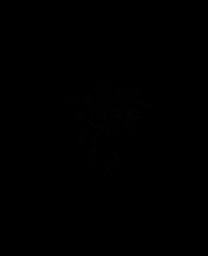

[Series 15: FLAIR · axial · 3.0mm · 0.45mm/px · z∈[-71,+75]mm · 4 of 50 slices shown (1 of 2)]
[im 1/50]
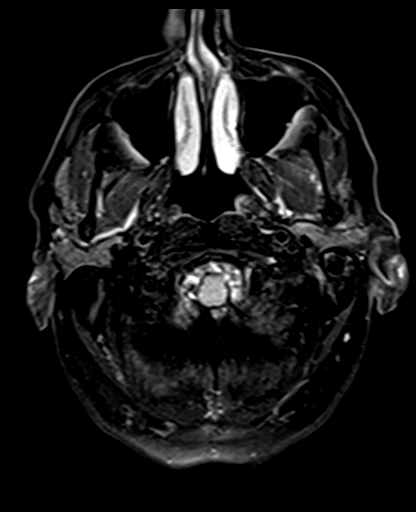
[im 17/50]
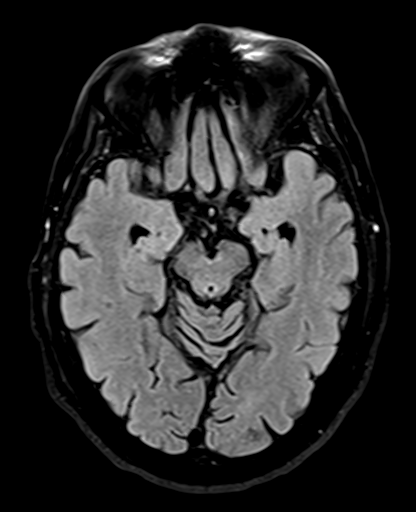
[im 33/50]
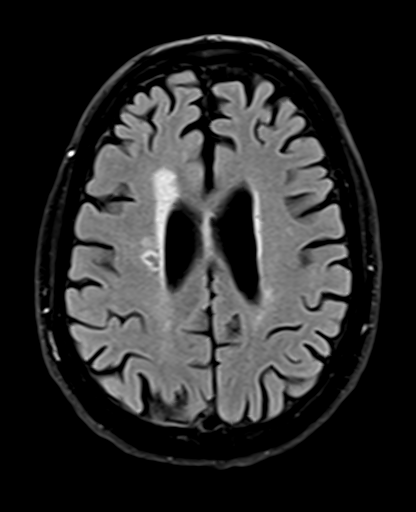
[im 50/50]
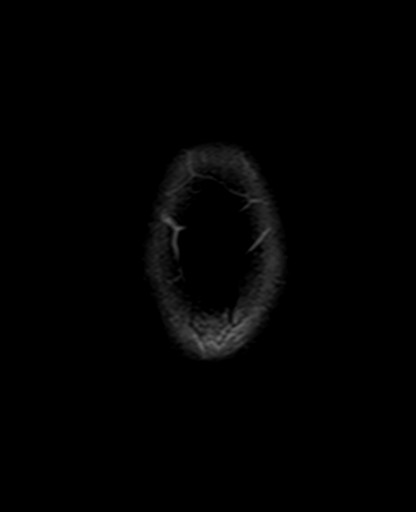

[Series 16: T1 · axial · 1.0mm · 0.98mm/px · z∈[-86,+88]mm · 13 of 175 slices shown (2 of 2)]
[im 1/175]
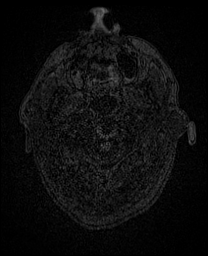
[im 15/175]
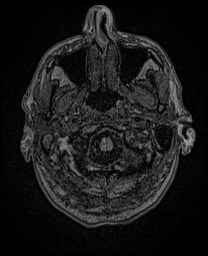
[im 30/175]
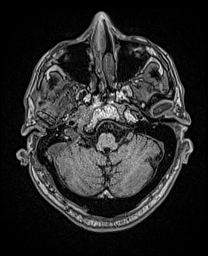
[im 44/175]
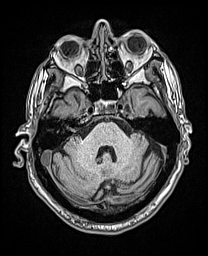
[im 59/175]
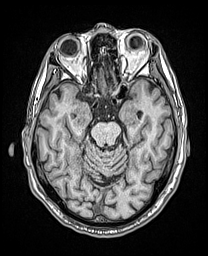
[im 73/175]
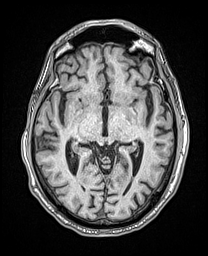
[im 88/175]
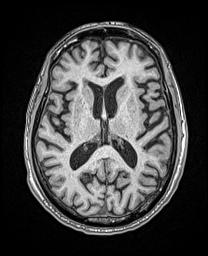
[im 102/175]
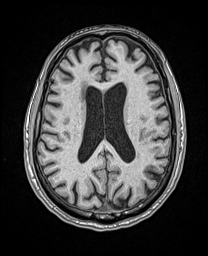
[im 117/175]
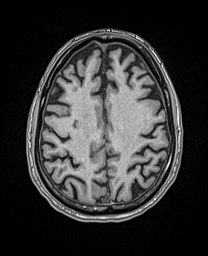
[im 131/175]
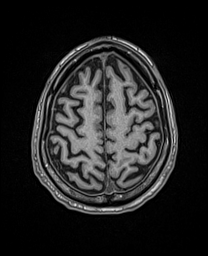
[im 146/175]
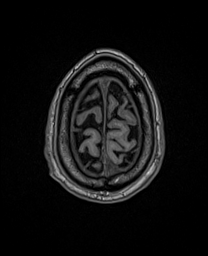
[im 160/175]
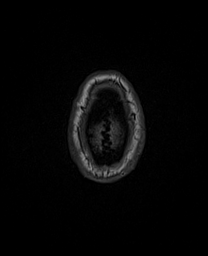
[im 175/175]
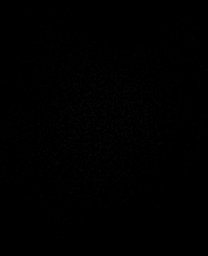

[Series 17: T2 · coronal · 5.0mm · 0.72mm/px · 2 of 30 slices shown (2 of 2)]
[im 1/30]
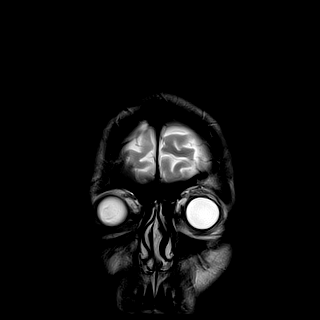
[im 30/30]
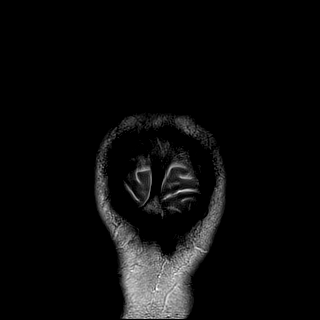

[Series 18: FLAIR · sagittal · 5.0mm · 0.94mm/px · 2 of 25 slices shown (2 of 2)]
[im 1/25]
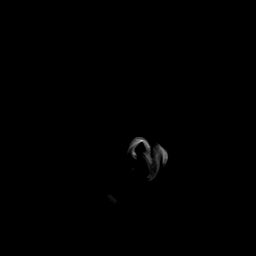
[im 25/25]
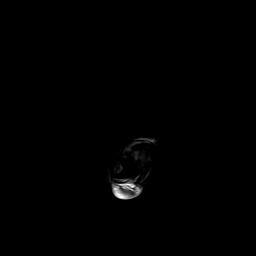

[48 of 48 positions shown; findings below may reference images not displayed]

FINDINGS: Brain:

Mild generalized cerebral atrophy.

There is a 15 mm focus of diffusion weighted hyperintensity within
the right centrum semiovale/corona radiata. There is corresponding
restricted diffusion on the ADC map peripheral, but T2 shine through
centrally. These findings are consistent with a subacute infarction.

Background mild multifocal T2/FLAIR hyperintensity within the
cerebral white matter which is nonspecific, but compatible with
chronic small vessel ischemic disease. These findings have
progressed as compared to the brain MRI of 07/02/2013.

A tiny chronic cortical infarct within the right frontal lobe motor
strip (series 15, image 38).

No evidence of intracranial mass.

No chronic intracranial blood products.

No extra-axial fluid collection.

No midline shift.

Vascular: Expected proximal arterial flow voids.

Skull and upper cervical spine: No focal marrow lesion. Incompletely
assessed upper cervical spondylosis. A C3-C4 disc bulge contributes
to apparent mild spinal canal stenosis and likely contacts the
ventral spinal cord.

Sinuses/Orbits: Visualized orbits show no acute finding. Small right
maxillary sinus mucous retention cyst. Minimal ethmoid sinus mucosal
thickening. Trace right mastoid effusion.

These results were called by telephone at the time of interpretation
on 11/12/2019 at [DATE] to provider ZULFIQAR AMERSON , who verbally
acknowledged these results.
IMPRESSION: 15 mm subacute infarct within the right centrum semiovale/corona
radiata.

Background mild cerebral white matter chronic small vessel ischemic
disease, progressed as compared to the brain MRI of 07/02/2013.

Tiny chronic cortical infarct within the right frontal lobe motor
strip, new as compared to the prior MRI.

Mild generalized cerebral atrophy.

## 2021-10-21 ENCOUNTER — Other Ambulatory Visit: Payer: Self-pay

## 2021-10-21 ENCOUNTER — Encounter (HOSPITAL_COMMUNITY): Payer: Self-pay | Admitting: Emergency Medicine

## 2021-10-21 DIAGNOSIS — K5900 Constipation, unspecified: Secondary | ICD-10-CM | POA: Diagnosis not present

## 2021-10-21 DIAGNOSIS — R52 Pain, unspecified: Secondary | ICD-10-CM | POA: Diagnosis not present

## 2021-10-21 DIAGNOSIS — Z7982 Long term (current) use of aspirin: Secondary | ICD-10-CM | POA: Insufficient documentation

## 2021-10-21 DIAGNOSIS — I10 Essential (primary) hypertension: Secondary | ICD-10-CM | POA: Insufficient documentation

## 2021-10-21 DIAGNOSIS — Z79899 Other long term (current) drug therapy: Secondary | ICD-10-CM | POA: Insufficient documentation

## 2021-10-21 DIAGNOSIS — K5641 Fecal impaction: Secondary | ICD-10-CM | POA: Diagnosis not present

## 2021-10-21 NOTE — ED Triage Notes (Signed)
Pt BIB [facility] for constipation; pt endorses some runny stool   178/76 HR 62 98% RA

## 2021-10-21 NOTE — ED Triage Notes (Signed)
Pt reports he feels as if he has an impaction but is having leakage around the impaction, pt reports he takes mag citrate capsules '1000mg'$  daily, last normal BM was 10/18/2021

## 2021-10-22 ENCOUNTER — Emergency Department (HOSPITAL_COMMUNITY): Payer: Medicare Other

## 2021-10-22 ENCOUNTER — Emergency Department (HOSPITAL_COMMUNITY)
Admission: EM | Admit: 2021-10-22 | Discharge: 2021-10-22 | Disposition: A | Discharge status: Home / Self Care | Payer: Medicare Other | Attending: Emergency Medicine | Admitting: Emergency Medicine

## 2021-10-22 DIAGNOSIS — K5900 Constipation, unspecified: Secondary | ICD-10-CM | POA: Diagnosis not present

## 2021-10-22 DIAGNOSIS — K5641 Fecal impaction: Secondary | ICD-10-CM

## 2021-10-22 MED ORDER — FLEET ENEMA 7-19 GM/118ML RE ENEM
1.0000 | ENEMA | Freq: Once | RECTAL | Status: AC
  Administered 2021-10-22: 1 via RECTAL

## 2021-10-22 MED ORDER — SORBITOL 70 % SOLN
960.0000 mL | TOPICAL_OIL | Freq: Once | ORAL | Status: AC
  Administered 2021-10-22: 960 mL via RECTAL
  Filled 2021-10-22: qty 240

## 2021-10-22 NOTE — Discharge Instructions (Signed)
Follow-up with primary doctor for any problems.

## 2021-10-22 NOTE — ED Provider Notes (Signed)
[facility] EMERGENCY DEPARTMENT Provider Note   CSN: [id number] Arrival date & time: 10/21/21  2127     History  Chief Complaint  Patient presents with   Constipation    Stephen [name] is a 86 y.o. male.  Patient is an 86 year old male with past medical history of hyperlipidemia, hypertension.  Patient presenting today with complaints of constipation.  He reports a 4 to 5-day history of no bowel movements.  He reports having stool at his rectum that he cannot push out.  He has tried using magnesium citrate and suppositories, but is only able to pass a small amount of liquid.  He denies any bleeding.  Colonoscopies in the past have always been unremarkable with the exception of follow-up with excision.  The history is provided by the patient.       Home Medications Prior to Admission medications   Medication Sig Start Date End Date Taking? Authorizing Provider  Alpha-Lipoic Acid 200 MG CAPS Take 1 capsule by mouth daily.    [provider]  AMBULATORY NON FORMULARY MEDICATION Flaxseed Meal 1/4 cup 3-4 times/week    [provider]  AMBULATORY NON FORMULARY MEDICATION Healthy Prostate  Take 1 tablet by mouth twice daily    [provider]  amLODipine (NORVASC) 10 MG tablet Take 1 tablet (10 mg total) by mouth every evening. 10/06/19   Roxan Hockey, MD  Ascorbic Acid (VITAMIN C) 500 MG CAPS Take 500 mg by mouth 2 (two) times daily.     [provider]  aspirin 325 MG tablet Take 325 mg by mouth daily.    [provider]  ASTAXANTHIN PO Take 1 tablet by mouth daily.    [provider]  Berberine Chloride POWD Take 500 mg by mouth 2 (two) times daily with a meal.    [provider]  Cholecalciferol (VITAMIN D3) 5000 UNITS TABS Take 1 tablet by mouth daily.    [provider]  Coenzyme Q10 (CO Q 10) 100 MG CAPS Take 1 capsule by mouth 2 (two) times daily.    [provider]  Cyanocobalamin (VITAMIN  B12) 3000 MCG/ML LIQD Place 1 tablet under the tongue daily.    [provider]  Magnesium 500 MG TABS Take 500 mg by mouth daily.    [provider]  metoprolol tartrate (LOPRESSOR) 50 MG tablet Take 1 tablet (50 mg total) by mouth 2 (two) times daily. 10/06/19   Roxan Hockey, MD  Misc Natural Products (BLACK CHERRY CONCENTRATE PO) Take 1 capsule by mouth in the morning and at bedtime.    [provider]  Multiple Vitamin (MULTIVITAMIN WITH MINERALS) TABS tablet Take 1 tablet by mouth daily.    [provider]  niacin (NIASPAN) 1000 MG CR tablet Take 1,000 mg by mouth at bedtime.    [provider]  NON FORMULARY Advance Memory Formula    [provider]  Omega 3-6-9 Fatty Acids (TRIPLE OMEGA-3-6-9) CAPS Take 1 capsule by mouth 2 (two) times daily.    [provider]  OVER THE COUNTER MEDICATION Multiple over the counter medications.    [provider]  rosuvastatin (CRESTOR) 10 MG tablet Take 1 tablet (10 mg total) by mouth at bedtime. For stroke prevention Patient taking differently: Take 5 mg by mouth at bedtime. For stroke prevention 10/06/19   Roxan Hockey, MD  timolol (BETIMOL) 0.5 % ophthalmic solution Place 1 drop into both eyes 2 (two) times daily.     [provider]  timolol (TIMOPTIC) 0.5 % ophthalmic solution  10/30/19   [provider]  vitamin E 180 MG (400 UNITS) capsule Take 400 Units by mouth daily.    [provider]      Allergies    Levofloxacin, Benicar hct [olmesartan medoxomil-hctz], Celebrex [celecoxib], Combigan [brimonidine tartrate-timolol], Hydralazine, Metronidazole, Propoxyphene napsylate [propoxyphene], and Clarithromycin    Review of Systems   Review of Systems  All other systems reviewed and are negative.   Physical Exam Updated Vital Signs BP (!) 157/81 (BP Location: Right Arm)   Pulse 79   Temp 98.4 F (36.9 C) (Oral)   Resp 16   Ht '5\' 8"'$  (1.727  m)   Wt 70.3 kg   SpO2 98%   BMI 23.57 kg/m  Physical Exam Vitals and nursing note reviewed.  Constitutional:      General: He is not in acute distress.    Appearance: He is well-developed. He is not diaphoretic.  HENT:     Head: Normocephalic and atraumatic.  Cardiovascular:     Rate and Rhythm: Normal rate and regular rhythm.     Heart sounds: No murmur heard.    No friction rub.  Pulmonary:     Effort: Pulmonary effort is normal. No respiratory distress.     Breath sounds: Normal breath sounds. No wheezing or rales.  Abdominal:     General: Bowel sounds are normal. There is no distension.     Palpations: Abdomen is soft.     Tenderness: There is no abdominal tenderness.  Genitourinary:    Comments: There is some stool noted in the rectal vault. Musculoskeletal:        General: Normal range of motion.     Cervical back: Normal range of motion and neck supple.  Skin:    General: Skin is warm and dry.  Neurological:     Mental Status: He is alert and oriented to person, place, and time.     Coordination: Coordination normal.     ED Results / Procedures / Treatments   Labs (all labs ordered are listed, but only abnormal results are displayed) Labs Reviewed - No data to display  EKG None  Radiology No results found.  Procedures Procedures    Medications Ordered in ED Medications  sodium phosphate (FLEET) 7-19 GM/118ML enema 1 enema (has no administration in time range)    ED Course/ Medical Decision Making/ A&P  Patient presenting here with complaints of constipation.  He is having difficulty passing stool out of his rectum.  He does have some stool on exam, however not a large impaction.  Patient was given a fleets enema with little results, then a smog enema and was able to have a bowel movement.  He is now feeling better.  KUB performed after the bowel movement does not reveal an excessive stool burden and I feel as though patient can safely be discharged.   His abdomen is benign.  Final Clinical Impression(s) / ED Diagnoses Final diagnoses:  None    Rx / DC Orders ED Discharge Orders     None         Veryl Speak, MD 10/22/21 [phone]

## 2021-10-25 ENCOUNTER — Telehealth: Payer: Self-pay

## 2021-10-25 DIAGNOSIS — G459 Transient cerebral ischemic attack, unspecified: Secondary | ICD-10-CM

## 2021-10-25 DIAGNOSIS — I10 Essential (primary) hypertension: Secondary | ICD-10-CM

## 2021-10-25 NOTE — Patient Outreach (Signed)
Received a Nurse call center notification for Mr. Zangara . I have sent a referral to [place] Team to call for follow up and determine if there are any Case Management needs.    [facility], [place], [place] Management [phone]

## 2021-10-26 ENCOUNTER — Telehealth: Payer: Self-pay

## 2021-10-26 NOTE — Telephone Encounter (Signed)
     Patient  visit on 9/29  at [place] you been able to follow up with your primary care physician? YES  The patient was or was not able to obtain any needed medicine or equipment. YES  Are there diet recommendations that you are having difficulty following? NA  Patient expresses understanding of discharge instructions and education provided has no other needs at this time. [place], [facility], Care Management  [phone] [address], [place], [place] [address] Phone: [phone] Email: [email or web address]

## 2021-10-27 DIAGNOSIS — K5909 Other constipation: Secondary | ICD-10-CM | POA: Diagnosis not present

## 2021-10-27 DIAGNOSIS — K644 Residual hemorrhoidal skin tags: Secondary | ICD-10-CM | POA: Diagnosis not present

## 2021-10-28 ENCOUNTER — Ambulatory Visit: Payer: Self-pay | Admitting: *Deleted

## 2021-10-28 NOTE — Patient Outreach (Addendum)
  Care Coordination   Initial Visit Note   10/29/2021 Name: Stephen [name] MRN: [id number] DOB: 03/12/1935  Stephen [name] is a 86 y.o. year old male who sees [place] Crane, Edwinna Areola, MD for primary care. I spoke with  [name] by phone today.  What matters to the patients health and wellness today?  Constipation/Hemorrhoids He has managed this time with sitz baths,  $390 suppositories 2 a day for 10 days,  take magnesium citrate hx of chronic constipation, Drinks 4 -20 oz bottles of water daily   He reports he is still in pain  He reports the duration has been 3-4 days He believes it is related to intake of bread  had 2 stools since discharged on 10/22/21 from the ED   History of being seen by Dr Scarlette Shorts hx of polyps Treated by Dr Jeffie Pollock for history of a 11 mm left kidneys stone-residual lower back pain from       Goals Addressed               This Visit's Progress     Patient Stated     manage constipation (THN) (pt-stated)   Not on track     Care Coordination Interventions: Evaluation of current treatment plan related to constipation and patient's adherence to plan as established by provider Reviewed medications with patient and discussed frequency of use Discussed plans with patient for ongoing care management follow up and provided patient with direct contact information for care management team Screening for signs and symptoms of depression related to chronic disease state  Assessed social determinant of health barriers Encouraged him to outreach for medication assistance as needed for elevated costs of medication. Encouraged a balance meal and decrease carbohydrates if he is aware that bread triggers his constipation        SDOH assessments and interventions completed:  Yes  SDOH Interventions Today    Flowsheet Row Most Recent Value  SDOH Interventions   Food Insecurity Interventions Intervention Not Indicated  Transportation Interventions Intervention Not  Indicated  Utilities Interventions Intervention Not Indicated  Financial Strain Interventions Intervention Not Indicated  Stress Interventions Intervention Not Indicated        Care Coordination Interventions Activated:  Yes  Care Coordination Interventions:  Yes, provided   Follow up plan: Follow up call scheduled for 11/26/21 3 pm     Encounter Outcome:  Pt. Visit Completed   Kye Silverstein L. Lavina Hamman, RN, BSN, [place] Coordinator Office number [phone]

## 2021-10-29 NOTE — Patient Instructions (Signed)
Visit Information  Thank you for taking time to visit with me today. Please don't hesitate to contact me if I can be of assistance to you.   Following are the goals we discussed today:   Goals Addressed               This Visit's Progress     Patient Stated     manage constipation (THN) (pt-stated)   Not on track     Care Coordination Interventions: Evaluation of current treatment plan related to constipation and patient's adherence to plan as established by provider Reviewed medications with patient and discussed frequency of use Discussed plans with patient for ongoing care management follow up and provided patient with direct contact information for care management team Screening for signs and symptoms of depression related to chronic disease state  Assessed social determinant of health barriers Encouraged him to outreach for medication assistance as needed for elevated costs of medication. Encouraged a balance meal and decrease carbohydrates if he is aware that bread triggers his constipation        Our next appointment is by telephone on 11/26/21 at 3 pm  Please call the care guide team at [phone] if you need to cancel or reschedule your appointment.   If you are experiencing a Mental Health or [place] or need someone to talk to, please call the Suicide and Crisis Lifeline: 988 call the [place] National Suicide Prevention Lifeline: 781-818-7246 or TTY: 431-687-2786 TTY 705-858-9751) to talk to a trained counselor call 1-800-273-TALK (toll free, 24 hour hotline) call the [facility]: 671-350-2545 call 911   The patient verbalized understanding of instructions, educational materials, and care plan provided today and DECLINED offer to receive copy of patient instructions, educational materials, and care plan.   The patient has been provided with contact information for the care management team and has been advised to call with any  health related questions or concerns.   [place] Lavina Hamman, RN, BSN, [place] Coordinator Office number [phone]

## 2021-11-03 ENCOUNTER — Encounter: Payer: Self-pay | Admitting: Internal Medicine

## 2021-11-24 ENCOUNTER — Ambulatory Visit (INDEPENDENT_AMBULATORY_CARE_PROVIDER_SITE_OTHER): Payer: Medicare Other | Admitting: Gastroenterology

## 2021-11-24 ENCOUNTER — Encounter: Payer: Self-pay | Admitting: Gastroenterology

## 2021-11-24 VITALS — BP 163/70 | HR 58 | Temp 98.7°F | Ht 68.0 in | Wt 150.0 lb

## 2021-11-24 DIAGNOSIS — K5909 Other constipation: Secondary | ICD-10-CM

## 2021-11-24 NOTE — Patient Instructions (Signed)
Continue your current regimen for constipation.  Please call us with any concerns, and we will see you back as needed!  It was a pleasure to see you today. I want to create trusting relationships with patients to provide genuine, compassionate, and quality care. I value your feedback. If you receive a survey regarding your visit,  I greatly appreciate you taking time to fill this out.   Annitta Needs, PhD, ANP-BC [facility]

## 2021-11-24 NOTE — Progress Notes (Signed)
Gastroenterology Office Note    Referring Provider: Celene Squibb, MD Primary Care Physician:  Celene Squibb, MD  Primary GI: Dr. Gala Romney, previously Dr. Henrene Pastor at [facility]     Chief Complaint   Chief Complaint  Patient presents with   Constipation    New patient. Referred for constipation. Tried miralax and stool softner as needed, anucort -hc '25mg'$  rectal supp.      History of Present Illness   Stephen [name] is an 86 y.o. male presenting today at the request of Celene Squibb, MD due to constipation.   Prior colonoscopy July 20, 2017.  He was found to have 2 diminutive polyps (tubular adenomas), diverticulosis, and hemorrhoids.  No routine follow-up recommended due to age.  He has also undergone prior upper endoscopy 2016.  This was normal.   Takes Magnesium daily. If 2 days without BM, will take Miralax and increase magnesium. No straining. Stool softener daily. Changed diet. Had issues with hemorrhoids recently but now resolved s/p using suppositories. No issues now. No GERD. Notes chronic lower back pain. No other concerns today.      Past Medical History:  Diagnosis Date   Adenomatous colon polyp    Allergy    Arthritis    BPH (benign prostatic hypertrophy)    Cataract    bil cataracts removed   Chest pain    CKD (chronic kidney disease)    Diabetes mellitus without complication ([place])    controlled with diet   Diverticulosis    GERD (gastroesophageal reflux disease)    Glaucoma    Helicobacter pylori (H. pylori)    Hemorrhoids    Hyperlipidemia    Hypertension    Kidney stones    TIA (transient ischemic attack)    "side affect from medication had symptoms of TIA"   UTI (lower urinary tract infection)     Past Surgical History:  Procedure Laterality Date   CATARACT EXTRACTION Bilateral    COLONOSCOPY     CYSTOSCOPY WITH STENT PLACEMENT Left 05/01/2012   Procedure: CYSTOSCOPY WITH URETEROSCOPY AND STONE EXTRACTION, LEFT STENT PLACEMENT ;  Surgeon: Malka So, MD;  Location: [facility];  Service: Urology;  Laterality: Left;  STONE EXTRACTION  STENT PLACEMENT     ESOPHAGOGASTRODUODENOSCOPY ENDOSCOPY     LITHOTRIPSY      Current Outpatient Medications  Medication Sig Dispense Refill   Alpha-Lipoic Acid 300 MG CAPS Take 1 capsule by mouth 2 (two) times daily.     AMBULATORY NON FORMULARY MEDICATION Healthy Prostate  Take 1 tablet by mouth twice daily     amLODipine (NORVASC) 10 MG tablet Take 1 tablet (10 mg total) by mouth every evening. 30 tablet 2   amLODipine (NORVASC) 10 MG tablet Take 1 tablet by mouth daily.     Ascorbic Acid (VITAMIN C) 500 MG CAPS Take 500 mg by mouth daily.     aspirin 325 MG tablet Take 325 mg by mouth daily.     Berberine Chloride POWD Take 500 mg by mouth 2 (two) times daily with a meal.     Cholecalciferol (VITAMIN D3) 5000 UNITS TABS Take 1 tablet by mouth daily.     Coenzyme Q10 (CO Q 10) 100 MG CAPS Take 1 capsule by mouth 2 (two) times daily.     Cyanocobalamin (VITAMIN B12) 3000 MCG/ML LIQD Place 1 tablet under the tongue daily.     docusate sodium (COLACE) 100 MG capsule Take 100 mg by mouth  2 (two) times daily.     Magnesium 500 MG TABS Take 500 mg by mouth.  Takes 2 daily     metoprolol tartrate (LOPRESSOR) 50 MG tablet Take 1 tablet (50 mg total) by mouth 2 (two) times daily. 60 tablet 3   Misc Natural Products (BLACK CHERRY CONCENTRATE PO) Take 1 capsule by mouth daily.     Multiple Vitamin (MULTIVITAMIN WITH MINERALS) TABS tablet Take 1 tablet by mouth daily.     niacin (NIASPAN) 1000 MG CR tablet Take 1,000 mg by mouth at bedtime.     Omega 3-6-9 Fatty Acids (TRIPLE OMEGA-3-6-9) CAPS Take 1 capsule by mouth 2 (two) times daily.     OVER THE COUNTER MEDICATION L- Carinitin '500mg'$  one bid     OVER THE COUNTER MEDICATION N - Acetyl cysteine '600mg'$  one bid     OVER THE COUNTER MEDICATION Vitamin K -2 150 mcg in the morning     OVER THE COUNTER MEDICATION Vision essentials ultra - 4 capsules daily with a  meal     OVER THE COUNTER MEDICATION Cardiosense probiotic one daily     OVER THE COUNTER MEDICATION Omepga Q plus max - 2 capsules daily     rosuvastatin (CRESTOR) 5 MG tablet Take 5 mg by mouth daily.     timolol (BETIMOL) 0.5 % ophthalmic solution Place 1 drop into both eyes 2 (two) times daily.      vitamin E 180 MG (400 UNITS) capsule Take 400 Units by mouth daily.     No current facility-administered medications for this visit.    Allergies as of 11/24/2021 - Review Complete 11/24/2021  Allergen Reaction Noted   Levofloxacin Other (See Comments) 03/08/2011   Benicar hct [olmesartan medoxomil-hctz] Other (See Comments) 12/10/2012   Benicar [olmesartan]  10/28/2021   Celecoxib Other (See Comments) 04/30/2012   Clarithromycin  10/28/2021   Combigan [brimonidine tartrate-timolol] Other (See Comments) 01/24/2017   Crestor [rosuvastatin]  11/24/2021   Dorzolamide  11/24/2021   Doxycycline  10/28/2021   Hydralazine Other (See Comments) 12/10/2012   Latanoprost  11/24/2021   Levofloxacin  10/28/2021   Metronidazole Other (See Comments) 03/23/2011   Plavix [clopidogrel]  11/24/2021   Propoxyphene Other (See Comments) 03/11/2014   Tetracyclines & related  11/24/2021   Clarithromycin Rash 03/18/2011    Family History  Problem Relation Age of Onset   Heart attack Father    Pancreatic cancer Brother    Colon polyps Brother    Lung cancer Brother    Heart disease Brother    Colon cancer Brother    Prostate cancer Brother    Diabetes Brother        x 3 broderline DM   Esophageal cancer Neg Hx    Stomach cancer Neg Hx     Social History   Socioeconomic History   Marital status: Widowed    Spouse name: Not on file   Number of children: 0   Years of education: 12th   Highest education level: Not on file  Occupational History   Occupation: Retired from [employer] work  Tobacco Use   Smoking status: Never    Passive exposure: Never   Smokeless tobacco: Never  Vaping Use    Vaping Use: Never used  Substance and Sexual Activity   Alcohol use: No   Drug use: No   Sexual activity: Never  Other Topics Concern   Not on file  Social History Narrative   Lives in [place] by himself, Lower level  Regular exercise   Right handed   Social Determinants of Health   Financial Resource Strain: Low Risk  (10/29/2021)   Overall Financial Resource Strain (CARDIA)    Difficulty of Paying Living Expenses: Not hard at all  Food Insecurity: No Food Insecurity (10/29/2021)   Hunger Vital Sign    Worried About Running Out of Food in the Last Year: Never true    Ran Out of Food in the Last Year: Never true  Transportation Needs: No Transportation Needs (10/29/2021)   PRAPARE - Hydrologist (Medical): No    Lack of Transportation (Non-Medical): No  Physical Activity: Not on file  Stress: No Stress Concern Present (10/29/2021)   [place]    Feeling of Stress : Only a little  Social Connections: Not on file  Intimate Partner Violence: Not on file     Review of Systems   Gen: Denies any fever, chills, fatigue, weight loss, lack of appetite.  CV: Denies chest pain, heart palpitations, peripheral edema, syncope.  Resp: Denies shortness of breath at rest or with exertion. Denies wheezing or cough.  GI: see HPI GU : Denies urinary burning, urinary frequency, urinary hesitancy MS: Denies joint pain, muscle weakness, cramps, or limitation of movement.  Derm: Denies rash, itching, dry skin Psych: Denies depression, anxiety, memory loss, and confusion Heme: Denies bruising, bleeding, and enlarged lymph nodes.   Physical Exam   BP (!) 163/70 (BP Location: Left Arm, Patient Position: Sitting, Cuff Size: Normal)   Pulse (!) 58   Temp 98.7 F (37.1 C) (Oral)   Ht '5\' 8"'$  (1.727 m)   Wt 150 lb (68 kg)   BMI 22.81 kg/m  General:   Alert and oriented. Pleasant and cooperative.  Well-nourished and well-developed.  Head:  Normocephalic and atraumatic. Eyes:  Without icterus Ears:  Normal auditory acuity. Lungs:  Clear to auscultation bilaterally.  Heart:  S1, S2 present without murmurs appreciated.  Abdomen:  +BS, soft, non-tender and non-distended. No HSM noted. No guarding or rebound. No masses appreciated. Small umbilical hernia.  Rectal:  Deferred  Msk:  Symmetrical without gross deformities. Normal posture. Extremities:  Without edema. Neurologic:  Alert and  oriented x4;  grossly normal neurologically. Skin:  Intact without significant lesions or rashes. Psych:  Alert and cooperative. Normal mood and affect.   Assessment   Stephen [name] is an 86 y.o. male presenting today at the request of Celene Squibb, MD due to constipation.   Prior colonoscopy July 20, 2017.  He was found to have 2 diminutive polyps (tubular adenomas), diverticulosis, and hemorrhoids.  No routine follow-up recommended due to age.  He has also undergone prior upper endoscopy 2016.  This was normal.   Constipation now managed with OTC magnesium supplement and occasional Miralax. Stool softener daily. Hemorrhoids have resolved. He has no concerns today and happy with this regimen.   We can see him back as needed. He understands to call if any concerns.     PLAN   Continue current bowel regimen  Return prn   Annitta Needs, PhD, [facility]astroenterology

## 2021-11-26 ENCOUNTER — Ambulatory Visit: Payer: Self-pay | Admitting: *Deleted

## 2021-11-26 NOTE — Patient Outreach (Signed)
  Care Coordination   11/26/2021 Name: Stephen [name] MRN: [id number] DOB: 19-Dec-1935   Care Coordination Outreach Attempts:  An unsuccessful telephone outreach was attempted today to offer the patient information about available care coordination services as a benefit of their health plan.   Follow Up Plan:  Additional outreach attempts will be made to offer the patient care coordination information and services.   Encounter Outcome:  No Answer  Care Coordination Interventions Activated:  No   Care Coordination Interventions:  No, not indicated    Valgene Deloatch L. Lavina Hamman, RN, BSN, [place] Coordinator Office number [phone]

## 2021-11-30 ENCOUNTER — Telehealth: Payer: Self-pay | Admitting: *Deleted

## 2021-11-30 NOTE — Patient Outreach (Signed)
  Care Coordination   Incoming from patient  Visit Note   11/30/2021 Name: Stephen [name] MRN: [id number] DOB: 1935/11/17  Stephen [name] is a 86 y.o. year old male who sees Stephen [name], Stephen Areola, MD for primary care. I  received a voice message from Stephen [name] left on Saturday 11/27/21 at [place]   What matters to the patients health and wellness today?  Patient apologized for missed [facility] RN CM appointment on 11/26/21. Reports he is doing well and reports he will call RN CM as needed   Goals Addressed               This Visit's Progress     Patient Stated     manage constipation [facility]) (pt-stated)   On track     Care Coordination Interventions: Received an incoming call from patient Rescheduled him to 12/27/21 as he reports not concerns for November        SDOH assessments and interventions completed:  No     Care Coordination Interventions Activated:  Yes  Care Coordination Interventions:  Yes, provided   Follow up plan: Follow up call scheduled for 12/27/21    Encounter Outcome:  Pt. Visit Completed   Stephen [name] L. Stephen Hamman, RN, BSN, [place] Coordinator Office number [phone]

## 2021-12-06 ENCOUNTER — Encounter: Payer: Self-pay | Admitting: *Deleted

## 2021-12-23 ENCOUNTER — Telehealth: Payer: Self-pay | Admitting: *Deleted

## 2021-12-23 NOTE — Patient Outreach (Signed)
  Care Coordination   12/23/2021 Name: Stephen [name] MRN: [id number] DOB: 1935-09-14   Care Coordination Outreach Attempts:  An unsuccessful telephone outreach was attempted today to offer the patient information about available care coordination services as a benefit of their health plan.   Return outreach after received note form [facility] CMA that patient had been attempting to call me on 12/21/21   Follow Up Plan:  Additional outreach attempts will be made to offer the patient care coordination information and services.   Encounter Outcome:  No Answer   Care Coordination Interventions:  No, not indicated    Maryse Brierley L. Lavina Hamman, RN, BSN, [place] Coordinator Office number [phone]

## 2021-12-24 DIAGNOSIS — E782 Mixed hyperlipidemia: Secondary | ICD-10-CM | POA: Diagnosis not present

## 2021-12-24 DIAGNOSIS — E1165 Type 2 diabetes mellitus with hyperglycemia: Secondary | ICD-10-CM | POA: Diagnosis not present

## 2021-12-24 DIAGNOSIS — R946 Abnormal results of thyroid function studies: Secondary | ICD-10-CM | POA: Diagnosis not present

## 2021-12-27 ENCOUNTER — Ambulatory Visit: Payer: Self-pay | Admitting: *Deleted

## 2021-12-27 NOTE — Patient Instructions (Incomplete)
Visit Information  Thank you for taking time to visit with me today. Please don't hesitate to contact me if I can be of assistance to you.   Following are the goals we discussed today:   Goals Addressed               This Visit's Progress     Patient Stated     manage constipation ([facility]) (pt-stated)   On track     Care Coordination Interventions: Returned call to patient Confirmed he wants assistance with getting a MRI of his back. He reports that this was discussed with Roseanne Kaufman Received permission from patient to outreach to his pcp/staff if Vicente Males is not able to assist Discussed the differences in a CT, ultrasound and MRI Secure message sent to [place] who replied she was not aware of this Discussed vegan cheese, yogurt as options for his diary intake as he states he is now eliminating cheese Confirmed he is having a stool at least every 3 days  Allowed time for ventilation and questions E-mail sent to Arizona Constable at Dr Juel Burrow office about patient request for a MRI of his back.        Our next appointment is by telephone on *** at ***  Please call the care guide team at [phone] if you need to cancel or reschedule your appointment.   If you are experiencing a Mental Health or [place] or need someone to talk to, please call the Suicide and Crisis Lifeline: 988 call the [place] National Suicide Prevention Lifeline: 305-199-2696 or TTY: 623-429-6090 TTY 639-095-0651) to talk to a trained counselor call 1-800-273-TALK (toll free, 24 hour hotline) call the [facility]: ([phone] [phone] call 911   The patient verbalized understanding of instructions, educational materials, and care plan provided today and DECLINED offer to receive copy of patient instructions, educational materials, and care plan.   The patient has been provided with contact information for the care management team and has been advised to call with any health related questions or  concerns.   Krystol Rocco L. Lavina Hamman, RN, BSN, [place] Coordinator Office number [phone]

## 2021-12-27 NOTE — Patient Outreach (Incomplete)
  Care Coordination   Follow Up Visit Note   12/28/2021 Name: Stephen [name] MRN: [id number] DOB: [birth year]  Stephen [name] is a 86 y.o. year old male who sees Nevada Crane, Edwinna Areola, MD for primary care. I spoke with  Stephen [name] by phone today.  What matters to the patients health and wellness today?  Returned a call to patient after he left a message at [facility] office in RN CM absence  He is requesting a MRI Back He states he spoke with Vicente Males B about this [place] Blocker is not aware of this request Outreach to pcp and patient has an upcoming pcp  appointment on 12/30/21      Goals Addressed               This Visit's Progress     Patient Stated     manage back pain/imaging [facility]) (pt-stated)   Not on track     Care Coordination Interventions: Returned call to patient Confirmed he wants assistance with getting a MRI of his back. He reports that this was discussed with Roseanne Kaufman Received permission from patient to outreach to his pcp/staff if Vicente Males is not able to assist Discussed the differences in a CT, ultrasound and MRI Discussed importance of adherence to all scheduled medical appointments Secure message sent to [place] who replied she was not aware of this Allowed time for ventilation and questions E-mail sent to Arizona Constable at Dr Juel Burrow office about patient request for a MRI of his back. Confirmed he follows up with his pcp on 12/30/21      COMPLETED: manage constipation ([facility]) (pt-stated)   On track     Care Coordination Interventions: Returned call to patient Confirmed his constipation is resolved Discussed vegan cheese, yogurt as options for his diary intake as he states he is now eliminating cheese  Confirmed he is having a stool at least every 3 days         SDOH assessments and interventions completed:  No     Care Coordination Interventions:  Yes, provided   Follow up plan: Follow up call scheduled for 01/03/22    Encounter Outcome:  Pt. Visit Completed   Kaliope Quinonez L.  Lavina Hamman, RN, BSN, [place] Coordinator Office number [phone]

## 2021-12-30 DIAGNOSIS — R946 Abnormal results of thyroid function studies: Secondary | ICD-10-CM | POA: Diagnosis not present

## 2021-12-30 DIAGNOSIS — E782 Mixed hyperlipidemia: Secondary | ICD-10-CM | POA: Diagnosis not present

## 2021-12-30 DIAGNOSIS — I10 Essential (primary) hypertension: Secondary | ICD-10-CM | POA: Diagnosis not present

## 2021-12-30 DIAGNOSIS — I6529 Occlusion and stenosis of unspecified carotid artery: Secondary | ICD-10-CM | POA: Diagnosis not present

## 2021-12-30 DIAGNOSIS — R809 Proteinuria, unspecified: Secondary | ICD-10-CM | POA: Diagnosis not present

## 2021-12-30 DIAGNOSIS — E1169 Type 2 diabetes mellitus with other specified complication: Secondary | ICD-10-CM | POA: Diagnosis not present

## 2021-12-30 DIAGNOSIS — I639 Cerebral infarction, unspecified: Secondary | ICD-10-CM | POA: Diagnosis not present

## 2021-12-30 DIAGNOSIS — G459 Transient cerebral ischemic attack, unspecified: Secondary | ICD-10-CM | POA: Diagnosis not present

## 2021-12-30 DIAGNOSIS — Z86010 Personal history of colonic polyps: Secondary | ICD-10-CM | POA: Diagnosis not present

## 2021-12-30 DIAGNOSIS — H409 Unspecified glaucoma: Secondary | ICD-10-CM | POA: Diagnosis not present

## 2021-12-30 DIAGNOSIS — N182 Chronic kidney disease, stage 2 (mild): Secondary | ICD-10-CM | POA: Diagnosis not present

## 2021-12-30 DIAGNOSIS — Z125 Encounter for screening for malignant neoplasm of prostate: Secondary | ICD-10-CM | POA: Diagnosis not present

## 2022-01-03 ENCOUNTER — Ambulatory Visit: Payer: Self-pay | Admitting: *Deleted

## 2022-01-03 NOTE — Patient Outreach (Signed)
  Care Coordination   01/03/2022 Name: Stephen [name] MRN: [id number] DOB: 03/25/1935   Care Coordination Outreach Attempts:  An unsuccessful telephone outreach was attempted today to offer the patient information about available care coordination services as a benefit of their health plan.   Follow Up Plan:  Additional outreach attempts will be made to offer the patient care coordination information and services.   Encounter Outcome:  No Answer   Care Coordination Interventions:  Yes, provided   Request possible update from [place]. Lavina Hamman, RN, BSN, [place] Coordinator Office number [phone]

## 2022-01-03 NOTE — Patient Instructions (Signed)
Visit Information  Thank you for taking time to visit with me today. Please don't hesitate to contact me if I can be of assistance to you.   Following are the goals we discussed today:   Goals Addressed               This Visit's Progress     Patient Stated     manage back pain/imaging [facility]) (pt-stated)        Care Coordination Interventions: Patient returned a call to RN CM after an unsuccessful follow up outreach to patient related to assistance with getting a MRI of his back Confirmed he discussed getting a MRI with pcp on 12/30/21 and was informed it would be ordered Assessed for worsening symptoms - positive for back pain, urinary incontinence, and weak urine stream Discussed possible causes- UTIs, kidney stone, enlarged prostate Confirmed he has seen and can be seen by an urologist (Dr Jeffie Pollock or [place]) as needed Had a discussion about his last PSA  Educated him on the diagnosis of chronic kidney disease listed on his medical problems on 01/27/21 Active listening as he discussed his urinary concerns E-mail sent to [name] at Dr Juel Burrow office for possible update on MRI, to request (per patient) if an urine specimen can be obtained         Our next appointment is by telephone on 02/01/22 at 1130  Please call the care guide team at [phone] if you need to cancel or reschedule your appointment.   If you are experiencing a Mental Health or [place] or need someone to talk to, please call the Suicide and Crisis Lifeline: 988 call the [place] National Suicide Prevention Lifeline: 731-071-3795 or TTY: 878-072-0754 TTY 6673514486) to talk to a trained counselor call 1-800-273-TALK (toll free, 24 hour hotline) call the [facility]: [phone] call 911   The patient verbalized understanding of instructions, educational materials, and care plan provided today and DECLINED offer to receive copy of patient instructions, educational materials, and  care plan.   The patient has been provided with contact information for the care management team and has been advised to call with any health related questions or concerns.   Beulah Capobianco L. Lavina Hamman, RN, BSN, [place] Coordinator Office number [phone]

## 2022-01-03 NOTE — Patient Outreach (Signed)
  Care Coordination   Follow Up Visit Note   01/03/2022 Name: Stephen [name] MRN: [id number] DOB: May 25, 1935  Stephen [name] is a 86 y.o. year old male who sees Nevada Crane, Edwinna Areola, MD for primary care. I spoke with  [name] by phone today.  What matters to the patients health and wellness today?  Urination flow & incontinence concern PMH Chronic Kidney disease (CKD), UTI. Kidney stones  had been followed by Dr Jeffie Pollock & Dr Alyson Ingles Reports he had an urinary incontinence episode on 01/02/22 PSA=1.2 (normal)  on 06/25/21  He reports the best number to reach him is his cell phone Updated in EPIC  He discusses his interaction with [place] [employer] [place] book he is reading     Goals Addressed               This Visit's Progress     Patient Stated     manage back pain/imaging [facility]) (pt-stated)        Care Coordination Interventions: Patient returned a call to RN CM after an unsuccessful follow up outreach to patient related to assistance with getting a MRI of his back Confirmed he discussed getting a MRI with pcp on 12/30/21 and was informed it would be ordered Assessed for worsening symptoms - positive for back pain, urinary incontinence, and weak urine stream Discussed possible causes- UTIs, kidney stone, enlarged prostate Confirmed he has seen and can be seen by an urologist (Dr Jeffie Pollock or [place]) as needed Had a discussion about his last PSA  Educated him on the diagnosis of chronic kidney disease listed on his medical problems on 01/27/21 Active listening as he discussed his urinary concerns E-mail sent to [name] at Dr Juel Burrow office for possible update on MRI, to request (per patient) if an urine specimen can be obtained         SDOH assessments and interventions completed:  No     Care Coordination Interventions:  Yes, provided   Follow up plan: Follow up call scheduled for 02/01/22    Encounter Outcome:  Pt. Visit Completed   Jevon Littlepage L. Lavina Hamman,  RN, BSN, [place] Coordinator Office number [phone]

## 2022-01-06 ENCOUNTER — Ambulatory Visit: Payer: Self-pay | Admitting: *Deleted

## 2022-01-06 NOTE — Patient Outreach (Signed)
  Care Coordination   Outreach from patient  Visit Note   01/06/2022 Name: Stephen [name] MRN: [id number] DOB: 1935/04/07  Stephen [name] is a 86 y.o. year old male who sees Stephen [name], Stephen Areola, MD for primary care. I spoke with  Stephen [name] by phone today.  What matters to the patients health and wellness today?  Updates on pcp assistance with tests/imaging for his back    Goals Addressed               This Visit's Progress     Patient Stated     manage back pain/imaging [facility]) (pt-stated)   Not on track     Care Coordination Interventions: Patient call to RN CM related to an update about assistance with getting a MRI of his back Confirmed he discussed getting a MRI with pcp on 12/30/21 and was informed it would be ordered Updated patient on the pending response from a 01/03/22 e-mail to Stephen [name] at the pcp office  After concluded the call with the patient, reviewed EPIC for any possible ordered tests in care everywhere and imaging - none found Left a message at pcp front desk (office closed for lunch 1-2 pm) requesting a call from [name] assessments and interventions completed:  No     Care Coordination Interventions:  Yes, provided   Follow up plan: Follow up call scheduled for 02/01/21    Encounter Outcome:  Pt. Visit Completed    Stephen [name]. Lavina Hamman, RN, BSN, [place] Coordinator Office number [phone]

## 2022-01-06 NOTE — Patient Instructions (Signed)
Visit Information  Thank you for taking time to visit with me today. Please don't hesitate to contact me if I can be of assistance to you.   Following are the goals we discussed today:   Goals Addressed               This Visit's Progress     Patient Stated     manage back pain/imaging [facility]) (pt-stated)   Not on track     Care Coordination Interventions: Patient call to RN CM related to an update about assistance with getting a MRI of his back Confirmed he discussed getting a MRI with pcp on 12/30/21 and was informed it would be ordered Updated patient on the pending response from a 01/03/22 e-mail to [name] at the pcp office  After concluded the call with the patient, reviewed EPIC for any possible ordered tests in care everywhere and imaging - none found Left a message at pcp front desk (office closed for lunch 1-2 pm) requesting a call from [place] next appointment is by telephone on 02/01/22 at 1130  Please call the care guide team at [phone] if you need to cancel or reschedule your appointment.   If you are experiencing a Mental Health or [place] or need someone to talk to, please call the Suicide and Crisis Lifeline: 988 call the [place] National Suicide Prevention Lifeline: 845-847-7603 or TTY: 401-087-5442 TTY (670)048-7157) to talk to a trained counselor call 1-800-273-TALK (toll free, 24 hour hotline) call the [facility]: 256-542-1027 call 911   The patient verbalized understanding of instructions, educational materials, and care plan provided today and DECLINED offer to receive copy of patient instructions, educational materials, and care plan.   The patient has been provided with contact information for the care management team and has been advised to call with any health related questions or concerns.   Brent Taillon L. Lavina Hamman, RN, BSN, [place] Coordinator Office number [phone]

## 2022-01-10 ENCOUNTER — Encounter: Payer: Self-pay | Admitting: *Deleted

## 2022-01-13 ENCOUNTER — Ambulatory Visit: Payer: Self-pay | Admitting: *Deleted

## 2022-01-13 NOTE — Patient Instructions (Signed)
Visit Information  Thank you for taking time to visit with me today. Please don't hesitate to contact me if I can be of assistance to you.   Following are the goals we discussed today:   Goals Addressed               This Visit's Progress     Patient Stated     manage back pain/imaging [facility]) (pt-stated)   Not on track     Care Coordination Interventions: Patient left a message for RN CM related to an update about assistance with getting a MRI of his back pending response from an email and message left on 01/03/22 & 01/13/22 e-mail to [name] at the pcp office         Our next appointment is by telephone on 02/01/22 at 1130  Please call the care guide team at [phone] if you need to cancel or reschedule your appointment.   If you are experiencing a Mental Health or [place] or need someone to talk to, please call the Suicide and Crisis Lifeline: 988 call the [place] National Suicide Prevention Lifeline: 807-782-5429 or TTY: 574-504-3322 TTY 815-689-8299) to talk to a trained counselor call 1-800-273-TALK (toll free, 24 hour hotline) call the [facility]: (432)394-1715 call 911   The patient verbalized understanding of instructions, educational materials, and care plan provided today and DECLINED offer to receive copy of patient instructions, educational materials, and care plan.   The patient has been provided with contact information for the care management team and has been advised to call with any health related questions or concerns.    Chanel Mcadams L. Lavina Hamman, RN, BSN, [place] Coordinator Office number [phone]

## 2022-01-13 NOTE — Patient Outreach (Signed)
  Care Coordination   Care coordination outreach  Visit Note   01/13/2022 Name: TYLEK BONEY MRN: [id number] DOB: [birth year]  MEDHANSH BRINKMEIER is a 86 y.o. year old male who sees Nevada Crane, Edwinna Areola, MD for primary care. I  received a message from Mr Rademaker inquiring about treatment for his back  What matters to the patients health and wellness today?  Back treatment  He previously reported to RN CM that he had discussed getting a MRI with pcp on 12/30/21 and was informed it would be ordered    Goals Addressed   None     SDOH assessments and interventions completed:  No     Care Coordination Interventions:  Yes, provided   Follow up plan: Follow up call scheduled for 02/01/22    Encounter Outcome:  Pt. Visit Completed   Ionna Avis L. Lavina Hamman, RN, BSN, [place] Coordinator Office number [phone]

## 2022-01-14 ENCOUNTER — Ambulatory Visit: Payer: Self-pay | Admitting: *Deleted

## 2022-01-14 DIAGNOSIS — H401121 Primary open-angle glaucoma, left eye, mild stage: Secondary | ICD-10-CM | POA: Diagnosis not present

## 2022-01-14 DIAGNOSIS — H11041 Peripheral pterygium, stationary, right eye: Secondary | ICD-10-CM | POA: Diagnosis not present

## 2022-01-14 DIAGNOSIS — H401112 Primary open-angle glaucoma, right eye, moderate stage: Secondary | ICD-10-CM | POA: Diagnosis not present

## 2022-01-14 NOTE — Patient Instructions (Addendum)
Visit Information  Thank you for taking time to visit with me today. Please don't hesitate to contact me if I can be of assistance to you.   Following are the goals we discussed today:   Goals Addressed               This Visit's Progress     Patient Stated     manage back pain/imaging Stephen [name]) (pt-stated)   Not on track     Care Coordination Interventions: Patient left a message for RN CM related to an update about assistance with getting a MRI of his  on 01/14/22 Outreach to Dr Stephen [name] Neurology 2 messages left to possible get patient seen. Spoke with Stephen [name] Dr Stephen [name] Nurse- a new referral would be needed to see patient as he was previously seen for TIA RN CM updated patient on all interventions to include emails, messages left and responses from pcp and neurology, Dr Stephen [name] need to have a new referral to see Dr Stephen [name] from pcp office from [place] This was reviewed with the patient -indicating MRI can be done if he develops worsening neurological symptoms Assessed for worsening neurological symptoms- Confirmed with patient that on today he is having rectal pain, dark stool he reports he has confirmed with another provider not to be related to bleeding and that the patient feels this may be related to his back pain. RN CM discussed & educated Stephen [name] on the differences in the pcp (Programmer, applications, help determine the specific worsening symptom, treat and/or to refer to a specialist as needed), gastroenterologist (GI, intestines, rectum), neurologist (nervous system) and urologist (bladder, urinary system) services RN CM encouraged patient to make an appointment with his gastroenterologist per his convenience as the patient agrees the pain may be related to his rectum and denies further neuropathy, neurological worsening symptoms. He was also encourage that he does have the availability and is welcome to return to his pcp, urologist and gastroenterologist to help him find the origin  of his symptoms and also see others for second opinions as needed He voiced understanding  Updated [place] next appointment is by telephone on 02/01/22 at 1130  Please call the care guide team at [phone] if you need to cancel or reschedule your appointment.   If you are experiencing a Mental Health or [place] or need someone to talk to, please call the Suicide and Crisis Lifeline: 988 call the [place] National Suicide Prevention Lifeline: 770-325-8050 or TTY: (210)096-0576 TTY 5405598418) to talk to a trained counselor call 1-800-273-TALK (toll free, 24 hour hotline) call the [facility]: ([phone] call 911   The patient verbalized understanding of instructions, educational materials, and care plan provided today and DECLINED offer to receive copy of patient instructions, educational materials, and care plan.   The patient has been provided with contact information for the care management team and has been advised to call with any health related questions or concerns.   Stephen [name] L. Stephen Hamman, RN, BSN, [place] Coordinator Office number [phone]

## 2022-01-14 NOTE — Patient Outreach (Addendum)
Care Coordination   Follow Up Visit Note   01/14/2022 Name: Stephen [name] MRN: [id number] DOB: 27-Oct-1935  Stephen [name] is a 86 y.o. year old male who sees Stephen [name], Stephen Areola, MD for primary care. I spoke with  Stephen [name] by phone today.  What matters to the patients health and wellness today?  Follow up with patient after he left a message on 01/14/22 inquiring about MRI order status from pcp for back pain Spoke with him 01/15/22 after messages sent to [place] via email on 01/14/22 and messages left x 2 at Stephen [name] office (neurology) Stephen [name] 2022 note indicated patient to return as needed  Stephen [name] voiced appreciation the RN CM interventions  Stephen [name] updated on a return call from [facility], Stephen [name] nurse- referral needed for him to be seen Patient voiced understanding He agrees to have RN CM send another message to pcp & staff  Updated Stephen [name] on the response from his pcp that indicated on 12/30/21 office visit for low back pain that if Stephen [name] starts having worsening neurological symptoms with pain going down his leg, nerve pain, neuropathy or radiculopathy then an MRI will be ordered for further evaluation  After reviewing this, Stephen [name] shares that today is having rectal pain with dark stool.  He denies worsening neurological pain. He reports consulting with another provider who informed him "It is not related to him bleeding" but a supplement he is taking. Stephen [name] states with this stool he is continuing to have the back pain and believes they may be related   He confirms he will call his gastroenterologist to schedule an appointment per his convenience He thanked RN CM for interventions and education provided    Goals Addressed               This Visit's Progress     Patient Stated     manage back pain/imaging [facility]) (pt-stated)   Not on track     Care Coordination Interventions: Patient left a message for RN CM related to an update about  assistance with getting a MRI of his  on 01/14/22 Outreach to Stephen [name] Neurology 2 messages left to possible get patient seen. Spoke with Stephen [name] Stephen [name] Nurse- a new referral would be needed to see patient as he was previously seen for TIA RN CM updated patient on all interventions to include emails, messages left and responses from pcp and neurology, Stephen Rulon [name] need to have a new referral to see Stephen [name] from pcp office from [place] C This was reviewed with the patient -indicating MRI can be done if he develops worsening neurological symptoms Assessed for worsening neurological symptoms- Confirmed with patient that on today he is having rectal pain, dark stool he reports he has confirmed with another provider not to be related to bleeding and that the patient feels this may be related to his back pain. RN CM discussed & educated Stephen [name] on the differences in the pcp (Programmer, applications, help determine the specific worsening symptom, treat and/or to refer to a specialist as needed), gastroenterologist (GI, intestines, rectum), neurologist (nervous system) and urologist (bladder, urinary system) services RN CM encouraged patient to make an appointment with his gastroenterologist per his convenience as the patient agrees the pain may be related to his rectum and denies further neuropathy, neurological worsening symptoms. He was also encourage that he does have the availability and is welcome to return to his pcp,  urologist and gastroenterologist to help him find the origin of his symptoms and also see others for second opinions as needed He voiced understanding  Updated Stephen [name]        SDOH assessments and interventions completed:  No     Care Coordination Interventions:  Yes, provided   Follow up plan: Follow up call scheduled for 02/01/22    Encounter Outcome:  Pt. Visit Completed   Stephen [name] L. Lavina Hamman, RN, BSN, [place] Coordinator Office number [phone]

## 2022-01-31 ENCOUNTER — Telehealth: Payer: Self-pay | Admitting: *Deleted

## 2022-01-31 NOTE — Progress Notes (Signed)
  Care Coordination Note  01/31/2022 Name: Stephen [name] MRN: [id number] DOB: 1936/01/06  Stephen [name] is a 87 y.o. year old male who is a primary care patient of Nevada Crane, Edwinna Areola, MD and is actively engaged with the care management team. I reached out to [name] by phone today to assist with re-scheduling a follow up visit with the RN Case Manager  Follow up plan: Patient declines further follow up and engagement by the care management team. Appropriate care team members and provider have been notified via electronic communication.   [place]  Direct Dial: [phone]

## 2022-01-31 NOTE — Progress Notes (Signed)
  Care Coordination Note  01/31/2022 Name: Stephen [name] MRN: [id number] DOB: 29-Jul-1935  [name] is a 87 y.o. year old male who is a primary care patient of Nevada Crane, Edwinna Areola, MD and is actively engaged with the care management team. I reached out to [name] by phone today to assist with provider appointment scheduling a follow up visit with the RN Case Manager  Follow up plan: Unsuccessful telephone outreach attempt made. A HIPAA compliant phone message was left for the patient providing contact information and requesting a return call.   [place]  Direct Dial: [phone]

## 2022-02-01 ENCOUNTER — Encounter: Payer: Self-pay | Admitting: *Deleted

## 2022-02-09 DIAGNOSIS — M5451 Vertebrogenic low back pain: Secondary | ICD-10-CM | POA: Diagnosis not present

## 2022-02-17 DIAGNOSIS — M545 Low back pain, unspecified: Secondary | ICD-10-CM | POA: Diagnosis not present

## 2022-03-03 DIAGNOSIS — M545 Low back pain, unspecified: Secondary | ICD-10-CM | POA: Diagnosis not present

## 2022-03-10 ENCOUNTER — Ambulatory Visit: Payer: Self-pay | Admitting: *Deleted

## 2022-03-10 DIAGNOSIS — M545 Low back pain, unspecified: Secondary | ICD-10-CM | POA: Diagnosis not present

## 2022-03-10 NOTE — Patient Outreach (Signed)
  Care Coordination- case closure  Name: Stephen [name]     MRN: [id number]       DOB: September 12, 1935   Received a message from [place] that patient declined further [facility] services as of 02/01/22   Patient Stated       manage back pain/imaging [facility]) (pt-stated)     Not on track        Care Coordination Interventions: case closure    Plan case closure

## 2022-03-15 DIAGNOSIS — D2272 Melanocytic nevi of left lower limb, including hip: Secondary | ICD-10-CM | POA: Diagnosis not present

## 2022-03-15 DIAGNOSIS — D0362 Melanoma in situ of left upper limb, including shoulder: Secondary | ICD-10-CM | POA: Diagnosis not present

## 2022-03-15 DIAGNOSIS — L812 Freckles: Secondary | ICD-10-CM | POA: Diagnosis not present

## 2022-03-15 DIAGNOSIS — D2271 Melanocytic nevi of right lower limb, including hip: Secondary | ICD-10-CM | POA: Diagnosis not present

## 2022-03-15 DIAGNOSIS — Z85828 Personal history of other malignant neoplasm of skin: Secondary | ICD-10-CM | POA: Diagnosis not present

## 2022-03-15 DIAGNOSIS — L821 Other seborrheic keratosis: Secondary | ICD-10-CM | POA: Diagnosis not present

## 2022-03-15 DIAGNOSIS — D485 Neoplasm of uncertain behavior of skin: Secondary | ICD-10-CM | POA: Diagnosis not present

## 2022-03-15 DIAGNOSIS — D225 Melanocytic nevi of trunk: Secondary | ICD-10-CM | POA: Diagnosis not present

## 2022-03-17 DIAGNOSIS — M545 Low back pain, unspecified: Secondary | ICD-10-CM | POA: Diagnosis not present

## 2022-03-24 DIAGNOSIS — M545 Low back pain, unspecified: Secondary | ICD-10-CM | POA: Diagnosis not present

## 2022-04-14 DIAGNOSIS — Z85828 Personal history of other malignant neoplasm of skin: Secondary | ICD-10-CM | POA: Diagnosis not present

## 2022-04-14 DIAGNOSIS — D0362 Melanoma in situ of left upper limb, including shoulder: Secondary | ICD-10-CM | POA: Diagnosis not present

## 2022-04-26 DIAGNOSIS — R946 Abnormal results of thyroid function studies: Secondary | ICD-10-CM | POA: Diagnosis not present

## 2022-04-26 DIAGNOSIS — E1169 Type 2 diabetes mellitus with other specified complication: Secondary | ICD-10-CM | POA: Diagnosis not present

## 2022-04-26 DIAGNOSIS — E782 Mixed hyperlipidemia: Secondary | ICD-10-CM | POA: Diagnosis not present

## 2022-05-03 DIAGNOSIS — G459 Transient cerebral ischemic attack, unspecified: Secondary | ICD-10-CM | POA: Diagnosis not present

## 2022-05-03 DIAGNOSIS — R809 Proteinuria, unspecified: Secondary | ICD-10-CM | POA: Diagnosis not present

## 2022-05-03 DIAGNOSIS — I10 Essential (primary) hypertension: Secondary | ICD-10-CM | POA: Diagnosis not present

## 2022-05-03 DIAGNOSIS — K5909 Other constipation: Secondary | ICD-10-CM | POA: Diagnosis not present

## 2022-05-03 DIAGNOSIS — E1122 Type 2 diabetes mellitus with diabetic chronic kidney disease: Secondary | ICD-10-CM | POA: Diagnosis not present

## 2022-05-03 DIAGNOSIS — E1169 Type 2 diabetes mellitus with other specified complication: Secondary | ICD-10-CM | POA: Diagnosis not present

## 2022-05-03 DIAGNOSIS — N182 Chronic kidney disease, stage 2 (mild): Secondary | ICD-10-CM | POA: Diagnosis not present

## 2022-05-03 DIAGNOSIS — I6529 Occlusion and stenosis of unspecified carotid artery: Secondary | ICD-10-CM | POA: Diagnosis not present

## 2022-05-03 DIAGNOSIS — E039 Hypothyroidism, unspecified: Secondary | ICD-10-CM | POA: Diagnosis not present

## 2022-05-03 DIAGNOSIS — R946 Abnormal results of thyroid function studies: Secondary | ICD-10-CM | POA: Diagnosis not present

## 2022-05-03 DIAGNOSIS — E782 Mixed hyperlipidemia: Secondary | ICD-10-CM | POA: Diagnosis not present

## 2022-05-03 DIAGNOSIS — I129 Hypertensive chronic kidney disease with stage 1 through stage 4 chronic kidney disease, or unspecified chronic kidney disease: Secondary | ICD-10-CM | POA: Diagnosis not present

## 2022-05-17 DIAGNOSIS — H401121 Primary open-angle glaucoma, left eye, mild stage: Secondary | ICD-10-CM | POA: Diagnosis not present

## 2022-05-17 DIAGNOSIS — H11041 Peripheral pterygium, stationary, right eye: Secondary | ICD-10-CM | POA: Diagnosis not present

## 2022-05-17 DIAGNOSIS — H401112 Primary open-angle glaucoma, right eye, moderate stage: Secondary | ICD-10-CM | POA: Diagnosis not present

## 2022-05-17 DIAGNOSIS — E119 Type 2 diabetes mellitus without complications: Secondary | ICD-10-CM | POA: Diagnosis not present

## 2022-09-06 DIAGNOSIS — L821 Other seborrheic keratosis: Secondary | ICD-10-CM | POA: Diagnosis not present

## 2022-09-06 DIAGNOSIS — Z85828 Personal history of other malignant neoplasm of skin: Secondary | ICD-10-CM | POA: Diagnosis not present

## 2022-09-06 DIAGNOSIS — Z85820 Personal history of malignant melanoma of skin: Secondary | ICD-10-CM | POA: Diagnosis not present

## 2022-09-19 DIAGNOSIS — H11041 Peripheral pterygium, stationary, right eye: Secondary | ICD-10-CM | POA: Diagnosis not present

## 2022-09-19 DIAGNOSIS — H401112 Primary open-angle glaucoma, right eye, moderate stage: Secondary | ICD-10-CM | POA: Diagnosis not present

## 2022-09-19 DIAGNOSIS — H401121 Primary open-angle glaucoma, left eye, mild stage: Secondary | ICD-10-CM | POA: Diagnosis not present

## 2022-10-27 DIAGNOSIS — E1169 Type 2 diabetes mellitus with other specified complication: Secondary | ICD-10-CM | POA: Diagnosis not present

## 2022-10-27 DIAGNOSIS — E782 Mixed hyperlipidemia: Secondary | ICD-10-CM | POA: Diagnosis not present

## 2022-10-27 DIAGNOSIS — R946 Abnormal results of thyroid function studies: Secondary | ICD-10-CM | POA: Diagnosis not present

## 2022-11-02 DIAGNOSIS — I129 Hypertensive chronic kidney disease with stage 1 through stage 4 chronic kidney disease, or unspecified chronic kidney disease: Secondary | ICD-10-CM | POA: Diagnosis not present

## 2022-11-02 DIAGNOSIS — Z860100 Personal history of colon polyps, unspecified: Secondary | ICD-10-CM | POA: Diagnosis not present

## 2022-11-02 DIAGNOSIS — R946 Abnormal results of thyroid function studies: Secondary | ICD-10-CM | POA: Diagnosis not present

## 2022-11-02 DIAGNOSIS — R809 Proteinuria, unspecified: Secondary | ICD-10-CM | POA: Diagnosis not present

## 2022-11-02 DIAGNOSIS — I6529 Occlusion and stenosis of unspecified carotid artery: Secondary | ICD-10-CM | POA: Diagnosis not present

## 2022-11-02 DIAGNOSIS — H409 Unspecified glaucoma: Secondary | ICD-10-CM | POA: Diagnosis not present

## 2022-11-02 DIAGNOSIS — I10 Essential (primary) hypertension: Secondary | ICD-10-CM | POA: Diagnosis not present

## 2022-11-02 DIAGNOSIS — E1169 Type 2 diabetes mellitus with other specified complication: Secondary | ICD-10-CM | POA: Diagnosis not present

## 2022-11-02 DIAGNOSIS — G459 Transient cerebral ischemic attack, unspecified: Secondary | ICD-10-CM | POA: Diagnosis not present

## 2022-11-02 DIAGNOSIS — N182 Chronic kidney disease, stage 2 (mild): Secondary | ICD-10-CM | POA: Diagnosis not present

## 2022-11-02 DIAGNOSIS — E782 Mixed hyperlipidemia: Secondary | ICD-10-CM | POA: Diagnosis not present

## 2022-11-02 DIAGNOSIS — E1122 Type 2 diabetes mellitus with diabetic chronic kidney disease: Secondary | ICD-10-CM | POA: Diagnosis not present

## 2022-12-02 DIAGNOSIS — Z6826 Body mass index (BMI) 26.0-26.9, adult: Secondary | ICD-10-CM | POA: Diagnosis not present

## 2022-12-02 DIAGNOSIS — E663 Overweight: Secondary | ICD-10-CM | POA: Diagnosis not present

## 2022-12-02 DIAGNOSIS — J069 Acute upper respiratory infection, unspecified: Secondary | ICD-10-CM | POA: Diagnosis not present

## 2022-12-14 DIAGNOSIS — E119 Type 2 diabetes mellitus without complications: Secondary | ICD-10-CM | POA: Diagnosis not present

## 2022-12-14 DIAGNOSIS — H401113 Primary open-angle glaucoma, right eye, severe stage: Secondary | ICD-10-CM | POA: Diagnosis not present

## 2022-12-14 DIAGNOSIS — H401122 Primary open-angle glaucoma, left eye, moderate stage: Secondary | ICD-10-CM | POA: Diagnosis not present

## 2022-12-14 DIAGNOSIS — Z961 Presence of intraocular lens: Secondary | ICD-10-CM | POA: Diagnosis not present

## 2023-01-04 DIAGNOSIS — H401122 Primary open-angle glaucoma, left eye, moderate stage: Secondary | ICD-10-CM | POA: Diagnosis not present

## 2023-01-04 DIAGNOSIS — H401113 Primary open-angle glaucoma, right eye, severe stage: Secondary | ICD-10-CM | POA: Diagnosis not present

## 2023-04-06 DIAGNOSIS — H401122 Primary open-angle glaucoma, left eye, moderate stage: Secondary | ICD-10-CM | POA: Diagnosis not present

## 2023-04-06 DIAGNOSIS — H401113 Primary open-angle glaucoma, right eye, severe stage: Secondary | ICD-10-CM | POA: Diagnosis not present

## 2023-04-18 DIAGNOSIS — Z85828 Personal history of other malignant neoplasm of skin: Secondary | ICD-10-CM | POA: Diagnosis not present

## 2023-04-18 DIAGNOSIS — L812 Freckles: Secondary | ICD-10-CM | POA: Diagnosis not present

## 2023-04-18 DIAGNOSIS — L821 Other seborrheic keratosis: Secondary | ICD-10-CM | POA: Diagnosis not present

## 2023-04-18 DIAGNOSIS — D692 Other nonthrombocytopenic purpura: Secondary | ICD-10-CM | POA: Diagnosis not present

## 2023-04-18 DIAGNOSIS — Z85820 Personal history of malignant melanoma of skin: Secondary | ICD-10-CM | POA: Diagnosis not present

## 2023-04-18 DIAGNOSIS — D2271 Melanocytic nevi of right lower limb, including hip: Secondary | ICD-10-CM | POA: Diagnosis not present

## 2023-04-18 DIAGNOSIS — L565 Disseminated superficial actinic porokeratosis (DSAP): Secondary | ICD-10-CM | POA: Diagnosis not present

## 2023-04-18 DIAGNOSIS — D2272 Melanocytic nevi of left lower limb, including hip: Secondary | ICD-10-CM | POA: Diagnosis not present

## 2023-04-18 DIAGNOSIS — L853 Xerosis cutis: Secondary | ICD-10-CM | POA: Diagnosis not present

## 2023-04-18 DIAGNOSIS — D225 Melanocytic nevi of trunk: Secondary | ICD-10-CM | POA: Diagnosis not present

## 2023-04-28 DIAGNOSIS — R946 Abnormal results of thyroid function studies: Secondary | ICD-10-CM | POA: Diagnosis not present

## 2023-04-28 DIAGNOSIS — E782 Mixed hyperlipidemia: Secondary | ICD-10-CM | POA: Diagnosis not present

## 2023-04-28 DIAGNOSIS — E1169 Type 2 diabetes mellitus with other specified complication: Secondary | ICD-10-CM | POA: Diagnosis not present

## 2023-04-28 DIAGNOSIS — Z125 Encounter for screening for malignant neoplasm of prostate: Secondary | ICD-10-CM | POA: Diagnosis not present

## 2023-05-03 DIAGNOSIS — G459 Transient cerebral ischemic attack, unspecified: Secondary | ICD-10-CM | POA: Diagnosis not present

## 2023-05-03 DIAGNOSIS — N182 Chronic kidney disease, stage 2 (mild): Secondary | ICD-10-CM | POA: Diagnosis not present

## 2023-05-03 DIAGNOSIS — Z0001 Encounter for general adult medical examination with abnormal findings: Secondary | ICD-10-CM | POA: Diagnosis not present

## 2023-05-03 DIAGNOSIS — R946 Abnormal results of thyroid function studies: Secondary | ICD-10-CM | POA: Diagnosis not present

## 2023-05-03 DIAGNOSIS — E1169 Type 2 diabetes mellitus with other specified complication: Secondary | ICD-10-CM | POA: Diagnosis not present

## 2023-05-03 DIAGNOSIS — I6529 Occlusion and stenosis of unspecified carotid artery: Secondary | ICD-10-CM | POA: Diagnosis not present

## 2023-05-03 DIAGNOSIS — K5909 Other constipation: Secondary | ICD-10-CM | POA: Diagnosis not present

## 2023-05-03 DIAGNOSIS — R809 Proteinuria, unspecified: Secondary | ICD-10-CM | POA: Diagnosis not present

## 2023-05-03 DIAGNOSIS — I10 Essential (primary) hypertension: Secondary | ICD-10-CM | POA: Diagnosis not present

## 2023-05-03 DIAGNOSIS — E782 Mixed hyperlipidemia: Secondary | ICD-10-CM | POA: Diagnosis not present

## 2023-05-03 DIAGNOSIS — I639 Cerebral infarction, unspecified: Secondary | ICD-10-CM | POA: Diagnosis not present

## 2023-05-03 DIAGNOSIS — H409 Unspecified glaucoma: Secondary | ICD-10-CM | POA: Diagnosis not present

## 2023-08-02 DIAGNOSIS — H401122 Primary open-angle glaucoma, left eye, moderate stage: Secondary | ICD-10-CM | POA: Diagnosis not present

## 2023-08-02 DIAGNOSIS — H401113 Primary open-angle glaucoma, right eye, severe stage: Secondary | ICD-10-CM | POA: Diagnosis not present

## 2023-08-18 DIAGNOSIS — R946 Abnormal results of thyroid function studies: Secondary | ICD-10-CM | POA: Diagnosis not present

## 2023-08-18 DIAGNOSIS — E1169 Type 2 diabetes mellitus with other specified complication: Secondary | ICD-10-CM | POA: Diagnosis not present

## 2023-08-18 DIAGNOSIS — E782 Mixed hyperlipidemia: Secondary | ICD-10-CM | POA: Diagnosis not present

## 2023-08-24 DIAGNOSIS — I129 Hypertensive chronic kidney disease with stage 1 through stage 4 chronic kidney disease, or unspecified chronic kidney disease: Secondary | ICD-10-CM | POA: Diagnosis not present

## 2023-08-24 DIAGNOSIS — N182 Chronic kidney disease, stage 2 (mild): Secondary | ICD-10-CM | POA: Diagnosis not present

## 2023-08-24 DIAGNOSIS — I639 Cerebral infarction, unspecified: Secondary | ICD-10-CM | POA: Diagnosis not present

## 2023-08-24 DIAGNOSIS — E782 Mixed hyperlipidemia: Secondary | ICD-10-CM | POA: Diagnosis not present

## 2023-08-24 DIAGNOSIS — R946 Abnormal results of thyroid function studies: Secondary | ICD-10-CM | POA: Diagnosis not present

## 2023-08-24 DIAGNOSIS — R809 Proteinuria, unspecified: Secondary | ICD-10-CM | POA: Diagnosis not present

## 2023-08-24 DIAGNOSIS — E1122 Type 2 diabetes mellitus with diabetic chronic kidney disease: Secondary | ICD-10-CM | POA: Diagnosis not present

## 2023-08-24 DIAGNOSIS — E1169 Type 2 diabetes mellitus with other specified complication: Secondary | ICD-10-CM | POA: Diagnosis not present

## 2023-08-24 DIAGNOSIS — I6529 Occlusion and stenosis of unspecified carotid artery: Secondary | ICD-10-CM | POA: Diagnosis not present

## 2023-08-24 DIAGNOSIS — H409 Unspecified glaucoma: Secondary | ICD-10-CM | POA: Diagnosis not present

## 2023-08-24 DIAGNOSIS — G459 Transient cerebral ischemic attack, unspecified: Secondary | ICD-10-CM | POA: Diagnosis not present

## 2023-08-24 DIAGNOSIS — I10 Essential (primary) hypertension: Secondary | ICD-10-CM | POA: Diagnosis not present

## 2023-09-13 ENCOUNTER — Emergency Department (HOSPITAL_COMMUNITY)

## 2023-09-13 ENCOUNTER — Emergency Department (HOSPITAL_COMMUNITY)
Admission: EM | Admit: 2023-09-13 | Discharge: 2023-09-13 | Discharge status: Against Medical Advice | Source: Home / Self Care

## 2023-09-13 DIAGNOSIS — Z66 Do not resuscitate: Secondary | ICD-10-CM | POA: Diagnosis not present

## 2023-09-13 DIAGNOSIS — F01518 Vascular dementia, unspecified severity, with other behavioral disturbance: Secondary | ICD-10-CM | POA: Diagnosis not present

## 2023-09-13 DIAGNOSIS — R7989 Other specified abnormal findings of blood chemistry: Secondary | ICD-10-CM | POA: Diagnosis not present

## 2023-09-13 DIAGNOSIS — M47812 Spondylosis without myelopathy or radiculopathy, cervical region: Secondary | ICD-10-CM | POA: Diagnosis not present

## 2023-09-13 DIAGNOSIS — E782 Mixed hyperlipidemia: Secondary | ICD-10-CM | POA: Diagnosis not present

## 2023-09-13 DIAGNOSIS — R8271 Bacteriuria: Secondary | ICD-10-CM | POA: Diagnosis not present

## 2023-09-13 DIAGNOSIS — H409 Unspecified glaucoma: Secondary | ICD-10-CM | POA: Diagnosis present

## 2023-09-13 DIAGNOSIS — R296 Repeated falls: Secondary | ICD-10-CM | POA: Diagnosis not present

## 2023-09-13 DIAGNOSIS — N200 Calculus of kidney: Secondary | ICD-10-CM | POA: Diagnosis not present

## 2023-09-13 DIAGNOSIS — K219 Gastro-esophageal reflux disease without esophagitis: Secondary | ICD-10-CM | POA: Diagnosis not present

## 2023-09-13 DIAGNOSIS — W19XXXA Unspecified fall, initial encounter: Secondary | ICD-10-CM | POA: Diagnosis present

## 2023-09-13 DIAGNOSIS — M47811 Spondylosis without myelopathy or radiculopathy, occipito-atlanto-axial region: Secondary | ICD-10-CM | POA: Diagnosis not present

## 2023-09-13 DIAGNOSIS — R404 Transient alteration of awareness: Secondary | ICD-10-CM | POA: Diagnosis not present

## 2023-09-13 DIAGNOSIS — R4182 Altered mental status, unspecified: Secondary | ICD-10-CM | POA: Diagnosis not present

## 2023-09-13 DIAGNOSIS — R9089 Other abnormal findings on diagnostic imaging of central nervous system: Secondary | ICD-10-CM | POA: Diagnosis not present

## 2023-09-13 DIAGNOSIS — Z8249 Family history of ischemic heart disease and other diseases of the circulatory system: Secondary | ICD-10-CM | POA: Diagnosis not present

## 2023-09-13 DIAGNOSIS — Z7982 Long term (current) use of aspirin: Secondary | ICD-10-CM | POA: Diagnosis not present

## 2023-09-13 DIAGNOSIS — G9341 Metabolic encephalopathy: Secondary | ICD-10-CM | POA: Diagnosis not present

## 2023-09-13 DIAGNOSIS — R58 Hemorrhage, not elsewhere classified: Secondary | ICD-10-CM | POA: Diagnosis not present

## 2023-09-13 DIAGNOSIS — Z833 Family history of diabetes mellitus: Secondary | ICD-10-CM | POA: Diagnosis not present

## 2023-09-13 DIAGNOSIS — Z1152 Encounter for screening for COVID-19: Secondary | ICD-10-CM | POA: Diagnosis not present

## 2023-09-13 DIAGNOSIS — R140 Abdominal distension (gaseous): Secondary | ICD-10-CM | POA: Insufficient documentation

## 2023-09-13 DIAGNOSIS — R7881 Bacteremia: Secondary | ICD-10-CM | POA: Diagnosis not present

## 2023-09-13 DIAGNOSIS — J9600 Acute respiratory failure, unspecified whether with hypoxia or hypercapnia: Secondary | ICD-10-CM | POA: Diagnosis not present

## 2023-09-13 DIAGNOSIS — Z5321 Procedure and treatment not carried out due to patient leaving prior to being seen by health care provider: Secondary | ICD-10-CM | POA: Insufficient documentation

## 2023-09-13 DIAGNOSIS — I639 Cerebral infarction, unspecified: Secondary | ICD-10-CM | POA: Diagnosis not present

## 2023-09-13 DIAGNOSIS — T796XXA Traumatic ischemia of muscle, initial encounter: Secondary | ICD-10-CM | POA: Diagnosis present

## 2023-09-13 DIAGNOSIS — N182 Chronic kidney disease, stage 2 (mild): Secondary | ICD-10-CM | POA: Diagnosis present

## 2023-09-13 DIAGNOSIS — M545 Low back pain, unspecified: Secondary | ICD-10-CM | POA: Insufficient documentation

## 2023-09-13 DIAGNOSIS — M1990 Unspecified osteoarthritis, unspecified site: Secondary | ICD-10-CM | POA: Diagnosis not present

## 2023-09-13 DIAGNOSIS — R6520 Severe sepsis without septic shock: Secondary | ICD-10-CM | POA: Diagnosis not present

## 2023-09-13 DIAGNOSIS — R531 Weakness: Secondary | ICD-10-CM | POA: Diagnosis not present

## 2023-09-13 DIAGNOSIS — Z043 Encounter for examination and observation following other accident: Secondary | ICD-10-CM | POA: Diagnosis not present

## 2023-09-13 DIAGNOSIS — K7200 Acute and subacute hepatic failure without coma: Secondary | ICD-10-CM | POA: Diagnosis not present

## 2023-09-13 DIAGNOSIS — S0083XA Contusion of other part of head, initial encounter: Secondary | ICD-10-CM | POA: Diagnosis not present

## 2023-09-13 DIAGNOSIS — I129 Hypertensive chronic kidney disease with stage 1 through stage 4 chronic kidney disease, or unspecified chronic kidney disease: Secondary | ICD-10-CM | POA: Diagnosis present

## 2023-09-13 DIAGNOSIS — K5641 Fecal impaction: Secondary | ICD-10-CM | POA: Diagnosis not present

## 2023-09-13 DIAGNOSIS — E1122 Type 2 diabetes mellitus with diabetic chronic kidney disease: Secondary | ICD-10-CM | POA: Diagnosis not present

## 2023-09-13 DIAGNOSIS — N400 Enlarged prostate without lower urinary tract symptoms: Secondary | ICD-10-CM | POA: Diagnosis not present

## 2023-09-13 DIAGNOSIS — I10 Essential (primary) hypertension: Secondary | ICD-10-CM | POA: Diagnosis not present

## 2023-09-13 DIAGNOSIS — B957 Other staphylococcus as the cause of diseases classified elsewhere: Secondary | ICD-10-CM | POA: Diagnosis not present

## 2023-09-13 DIAGNOSIS — M6282 Rhabdomyolysis: Secondary | ICD-10-CM | POA: Diagnosis not present

## 2023-09-13 DIAGNOSIS — A419 Sepsis, unspecified organism: Secondary | ICD-10-CM | POA: Diagnosis not present

## 2023-09-13 DIAGNOSIS — M6281 Muscle weakness (generalized): Secondary | ICD-10-CM | POA: Diagnosis not present

## 2023-09-13 DIAGNOSIS — E860 Dehydration: Secondary | ICD-10-CM | POA: Diagnosis not present

## 2023-09-13 DIAGNOSIS — E872 Acidosis, unspecified: Secondary | ICD-10-CM | POA: Diagnosis present

## 2023-09-13 DIAGNOSIS — K5909 Other constipation: Secondary | ICD-10-CM | POA: Diagnosis not present

## 2023-09-13 DIAGNOSIS — Y92009 Unspecified place in unspecified non-institutional (private) residence as the place of occurrence of the external cause: Secondary | ICD-10-CM | POA: Diagnosis not present

## 2023-09-13 DIAGNOSIS — Z8673 Personal history of transient ischemic attack (TIA), and cerebral infarction without residual deficits: Secondary | ICD-10-CM | POA: Diagnosis not present

## 2023-09-13 DIAGNOSIS — K6289 Other specified diseases of anus and rectum: Secondary | ICD-10-CM | POA: Insufficient documentation

## 2023-09-13 DIAGNOSIS — E119 Type 2 diabetes mellitus without complications: Secondary | ICD-10-CM | POA: Diagnosis not present

## 2023-09-13 DIAGNOSIS — A403 Sepsis due to Streptococcus pneumoniae: Secondary | ICD-10-CM | POA: Diagnosis not present

## 2023-09-13 DIAGNOSIS — G8190 Hemiplegia, unspecified affecting unspecified side: Secondary | ICD-10-CM | POA: Diagnosis not present

## 2023-09-13 DIAGNOSIS — S0590XA Unspecified injury of unspecified eye and orbit, initial encounter: Secondary | ICD-10-CM | POA: Diagnosis not present

## 2023-09-13 DIAGNOSIS — I6782 Cerebral ischemia: Secondary | ICD-10-CM | POA: Diagnosis not present

## 2023-09-13 DIAGNOSIS — N179 Acute kidney failure, unspecified: Secondary | ICD-10-CM | POA: Diagnosis present

## 2023-09-13 DIAGNOSIS — R6521 Severe sepsis with septic shock: Secondary | ICD-10-CM | POA: Diagnosis not present

## 2023-09-13 DIAGNOSIS — F419 Anxiety disorder, unspecified: Secondary | ICD-10-CM | POA: Diagnosis not present

## 2023-09-13 DIAGNOSIS — R41841 Cognitive communication deficit: Secondary | ICD-10-CM | POA: Diagnosis not present

## 2023-09-13 DIAGNOSIS — D72829 Elevated white blood cell count, unspecified: Secondary | ICD-10-CM | POA: Diagnosis not present

## 2023-09-13 DIAGNOSIS — E1143 Type 2 diabetes mellitus with diabetic autonomic (poly)neuropathy: Secondary | ICD-10-CM | POA: Diagnosis not present

## 2023-09-13 DIAGNOSIS — E7849 Other hyperlipidemia: Secondary | ICD-10-CM | POA: Diagnosis not present

## 2023-09-13 DIAGNOSIS — M19012 Primary osteoarthritis, left shoulder: Secondary | ICD-10-CM | POA: Diagnosis not present

## 2023-09-13 DIAGNOSIS — R7401 Elevation of levels of liver transaminase levels: Secondary | ICD-10-CM | POA: Diagnosis not present

## 2023-09-13 DIAGNOSIS — R911 Solitary pulmonary nodule: Secondary | ICD-10-CM | POA: Diagnosis not present

## 2023-09-13 DIAGNOSIS — R6510 Systemic inflammatory response syndrome (SIRS) of non-infectious origin without acute organ dysfunction: Secondary | ICD-10-CM | POA: Diagnosis present

## 2023-09-13 DIAGNOSIS — I6529 Occlusion and stenosis of unspecified carotid artery: Secondary | ICD-10-CM | POA: Diagnosis present

## 2023-09-13 NOTE — ED Triage Notes (Signed)
 Arrived POV from home. Patient reports no bowel movement since Saturday. Patient's abdomen is distended. Patient reports lower back pain 10/10, rectal pain 3/10. Patient states he feels like he needs to go but nothing comes out, also reports stool leaking out.

## 2023-09-15 ENCOUNTER — Other Ambulatory Visit: Payer: Self-pay

## 2023-09-15 ENCOUNTER — Inpatient Hospital Stay (HOSPITAL_COMMUNITY)
Admission: EM | Admit: 2023-09-15 | Discharge: 2023-09-21 | DRG: 564 | Disposition: A | Discharge status: Skilled Nursing Facility | Attending: Family Medicine | Admitting: Family Medicine

## 2023-09-15 ENCOUNTER — Emergency Department (HOSPITAL_COMMUNITY)

## 2023-09-15 ENCOUNTER — Observation Stay (HOSPITAL_COMMUNITY)

## 2023-09-15 DIAGNOSIS — A419 Sepsis, unspecified organism: Secondary | ICD-10-CM | POA: Diagnosis not present

## 2023-09-15 DIAGNOSIS — R7401 Elevation of levels of liver transaminase levels: Secondary | ICD-10-CM | POA: Diagnosis present

## 2023-09-15 DIAGNOSIS — E860 Dehydration: Secondary | ICD-10-CM | POA: Diagnosis present

## 2023-09-15 DIAGNOSIS — E1122 Type 2 diabetes mellitus with diabetic chronic kidney disease: Secondary | ICD-10-CM | POA: Diagnosis present

## 2023-09-15 DIAGNOSIS — R404 Transient alteration of awareness: Secondary | ICD-10-CM | POA: Diagnosis not present

## 2023-09-15 DIAGNOSIS — E872 Acidosis, unspecified: Secondary | ICD-10-CM | POA: Diagnosis present

## 2023-09-15 DIAGNOSIS — S0590XA Unspecified injury of unspecified eye and orbit, initial encounter: Secondary | ICD-10-CM | POA: Diagnosis not present

## 2023-09-15 DIAGNOSIS — R9089 Other abnormal findings on diagnostic imaging of central nervous system: Secondary | ICD-10-CM | POA: Diagnosis not present

## 2023-09-15 DIAGNOSIS — F419 Anxiety disorder, unspecified: Secondary | ICD-10-CM | POA: Diagnosis present

## 2023-09-15 DIAGNOSIS — N400 Enlarged prostate without lower urinary tract symptoms: Secondary | ICD-10-CM | POA: Diagnosis present

## 2023-09-15 DIAGNOSIS — Z9841 Cataract extraction status, right eye: Secondary | ICD-10-CM

## 2023-09-15 DIAGNOSIS — T796XXA Traumatic ischemia of muscle, initial encounter: Secondary | ICD-10-CM | POA: Diagnosis not present

## 2023-09-15 DIAGNOSIS — Z66 Do not resuscitate: Secondary | ICD-10-CM | POA: Diagnosis present

## 2023-09-15 DIAGNOSIS — Z7982 Long term (current) use of aspirin: Secondary | ICD-10-CM

## 2023-09-15 DIAGNOSIS — R911 Solitary pulmonary nodule: Secondary | ICD-10-CM | POA: Diagnosis not present

## 2023-09-15 DIAGNOSIS — E119 Type 2 diabetes mellitus without complications: Secondary | ICD-10-CM

## 2023-09-15 DIAGNOSIS — D72829 Elevated white blood cell count, unspecified: Secondary | ICD-10-CM

## 2023-09-15 DIAGNOSIS — I639 Cerebral infarction, unspecified: Secondary | ICD-10-CM | POA: Diagnosis not present

## 2023-09-15 DIAGNOSIS — N200 Calculus of kidney: Secondary | ICD-10-CM | POA: Diagnosis not present

## 2023-09-15 DIAGNOSIS — Z888 Allergy status to other drugs, medicaments and biological substances status: Secondary | ICD-10-CM

## 2023-09-15 DIAGNOSIS — N182 Chronic kidney disease, stage 2 (mild): Secondary | ICD-10-CM | POA: Diagnosis present

## 2023-09-15 DIAGNOSIS — R7881 Bacteremia: Secondary | ICD-10-CM | POA: Diagnosis present

## 2023-09-15 DIAGNOSIS — B957 Other staphylococcus as the cause of diseases classified elsewhere: Secondary | ICD-10-CM | POA: Diagnosis present

## 2023-09-15 DIAGNOSIS — Z833 Family history of diabetes mellitus: Secondary | ICD-10-CM

## 2023-09-15 DIAGNOSIS — J9600 Acute respiratory failure, unspecified whether with hypoxia or hypercapnia: Secondary | ICD-10-CM | POA: Diagnosis not present

## 2023-09-15 DIAGNOSIS — K5909 Other constipation: Secondary | ICD-10-CM | POA: Diagnosis present

## 2023-09-15 DIAGNOSIS — M6282 Rhabdomyolysis: Principal | ICD-10-CM | POA: Diagnosis present

## 2023-09-15 DIAGNOSIS — Y92009 Unspecified place in unspecified non-institutional (private) residence as the place of occurrence of the external cause: Secondary | ICD-10-CM

## 2023-09-15 DIAGNOSIS — Z8673 Personal history of transient ischemic attack (TIA), and cerebral infarction without residual deficits: Secondary | ICD-10-CM

## 2023-09-15 DIAGNOSIS — Z1152 Encounter for screening for COVID-19: Secondary | ICD-10-CM

## 2023-09-15 DIAGNOSIS — Z9842 Cataract extraction status, left eye: Secondary | ICD-10-CM

## 2023-09-15 DIAGNOSIS — I6529 Occlusion and stenosis of unspecified carotid artery: Secondary | ICD-10-CM | POA: Diagnosis present

## 2023-09-15 DIAGNOSIS — R6520 Severe sepsis without septic shock: Secondary | ICD-10-CM

## 2023-09-15 DIAGNOSIS — S0083XA Contusion of other part of head, initial encounter: Secondary | ICD-10-CM | POA: Diagnosis present

## 2023-09-15 DIAGNOSIS — I129 Hypertensive chronic kidney disease with stage 1 through stage 4 chronic kidney disease, or unspecified chronic kidney disease: Secondary | ICD-10-CM | POA: Diagnosis present

## 2023-09-15 DIAGNOSIS — Z043 Encounter for examination and observation following other accident: Secondary | ICD-10-CM | POA: Diagnosis not present

## 2023-09-15 DIAGNOSIS — Z79899 Other long term (current) drug therapy: Secondary | ICD-10-CM

## 2023-09-15 DIAGNOSIS — R7989 Other specified abnormal findings of blood chemistry: Secondary | ICD-10-CM | POA: Diagnosis present

## 2023-09-15 DIAGNOSIS — R58 Hemorrhage, not elsewhere classified: Secondary | ICD-10-CM | POA: Diagnosis not present

## 2023-09-15 DIAGNOSIS — G8190 Hemiplegia, unspecified affecting unspecified side: Secondary | ICD-10-CM | POA: Diagnosis not present

## 2023-09-15 DIAGNOSIS — R6510 Systemic inflammatory response syndrome (SIRS) of non-infectious origin without acute organ dysfunction: Secondary | ICD-10-CM | POA: Diagnosis present

## 2023-09-15 DIAGNOSIS — G9341 Metabolic encephalopathy: Secondary | ICD-10-CM | POA: Diagnosis present

## 2023-09-15 DIAGNOSIS — W19XXXA Unspecified fall, initial encounter: Secondary | ICD-10-CM | POA: Diagnosis present

## 2023-09-15 DIAGNOSIS — K219 Gastro-esophageal reflux disease without esophagitis: Secondary | ICD-10-CM | POA: Diagnosis present

## 2023-09-15 DIAGNOSIS — M47811 Spondylosis without myelopathy or radiculopathy, occipito-atlanto-axial region: Secondary | ICD-10-CM | POA: Diagnosis not present

## 2023-09-15 DIAGNOSIS — R296 Repeated falls: Secondary | ICD-10-CM | POA: Diagnosis present

## 2023-09-15 DIAGNOSIS — Z8249 Family history of ischemic heart disease and other diseases of the circulatory system: Secondary | ICD-10-CM

## 2023-09-15 DIAGNOSIS — E782 Mixed hyperlipidemia: Secondary | ICD-10-CM | POA: Diagnosis present

## 2023-09-15 DIAGNOSIS — H409 Unspecified glaucoma: Secondary | ICD-10-CM | POA: Diagnosis present

## 2023-09-15 DIAGNOSIS — R4182 Altered mental status, unspecified: Secondary | ICD-10-CM

## 2023-09-15 DIAGNOSIS — I10 Essential (primary) hypertension: Secondary | ICD-10-CM | POA: Diagnosis present

## 2023-09-15 DIAGNOSIS — N179 Acute kidney failure, unspecified: Secondary | ICD-10-CM | POA: Diagnosis present

## 2023-09-15 DIAGNOSIS — I6782 Cerebral ischemia: Secondary | ICD-10-CM | POA: Diagnosis not present

## 2023-09-15 DIAGNOSIS — M47812 Spondylosis without myelopathy or radiculopathy, cervical region: Secondary | ICD-10-CM | POA: Diagnosis not present

## 2023-09-15 DIAGNOSIS — M19012 Primary osteoarthritis, left shoulder: Secondary | ICD-10-CM | POA: Diagnosis not present

## 2023-09-15 LAB — CBC WITH DIFFERENTIAL/PLATELET
Abs Immature Granulocytes: 0.4 K/uL — ABNORMAL HIGH (ref 0.00–0.07)
Basophils Absolute: 0 K/uL (ref 0.0–0.1)
Basophils Relative: 0 %
Eosinophils Absolute: 0 K/uL (ref 0.0–0.5)
Eosinophils Relative: 0 %
HCT: 46.3 % (ref 39.0–52.0)
Hemoglobin: 16.2 g/dL (ref 13.0–17.0)
Lymphocytes Relative: 10 %
Lymphs Abs: 2 K/uL (ref 0.7–4.0)
MCH: 32.8 pg (ref 26.0–34.0)
MCHC: 35 g/dL (ref 30.0–36.0)
MCV: 93.7 fL (ref 80.0–100.0)
Metamyelocytes Relative: 2 %
Monocytes Absolute: 1.4 K/uL — ABNORMAL HIGH (ref 0.1–1.0)
Monocytes Relative: 7 %
Neutro Abs: 16 K/uL — ABNORMAL HIGH (ref 1.7–7.7)
Neutrophils Relative %: 81 %
Platelets: 175 K/uL (ref 150–400)
RBC: 4.94 MIL/uL (ref 4.22–5.81)
RDW: 12.9 % (ref 11.5–15.5)
Smear Review: NORMAL
WBC: 19.8 K/uL — ABNORMAL HIGH (ref 4.0–10.5)
nRBC: 0 % (ref 0.0–0.2)

## 2023-09-15 LAB — RESP PANEL BY RT-PCR (RSV, FLU A&B, COVID)  RVPGX2
Influenza A by PCR: NEGATIVE
Influenza B by PCR: NEGATIVE
Resp Syncytial Virus by PCR: NEGATIVE
SARS Coronavirus 2 by RT PCR: NEGATIVE

## 2023-09-15 LAB — GLUCOSE, CAPILLARY
Glucose-Capillary: 95 mg/dL (ref 70–99)
Glucose-Capillary: 128 mg/dL — ABNORMAL HIGH (ref 70–99)

## 2023-09-15 LAB — COMPREHENSIVE METABOLIC PANEL WITH GFR
ALT: 75 U/L — ABNORMAL HIGH (ref 0–44)
AST: 334 U/L — ABNORMAL HIGH (ref 15–41)
Albumin: 3.8 g/dL (ref 3.5–5.0)
Alkaline Phosphatase: 81 U/L (ref 38–126)
Anion gap: 19 — ABNORMAL HIGH (ref 5–15)
BUN: 37 mg/dL — ABNORMAL HIGH (ref 8–23)
CO2: 21 mmol/L — ABNORMAL LOW (ref 22–32)
Calcium: 9.3 mg/dL (ref 8.9–10.3)
Chloride: 99 mmol/L (ref 98–111)
Creatinine, Ser: 1.31 mg/dL — ABNORMAL HIGH (ref 0.61–1.24)
GFR, Estimated: 53 mL/min — ABNORMAL LOW (ref >60)
Glucose, Bld: 140 mg/dL — ABNORMAL HIGH (ref 70–99)
Potassium: 4.5 mmol/L (ref 3.5–5.1)
Sodium: 139 mmol/L (ref 135–145)
Total Bilirubin: 1.7 mg/dL — ABNORMAL HIGH (ref 0.0–1.2)
Total Protein: 7.3 g/dL (ref 6.5–8.1)

## 2023-09-15 LAB — AMMONIA: Ammonia: <13 umol/L (ref 9–35)

## 2023-09-15 LAB — CK: Total CK: 7422 U/L — ABNORMAL HIGH (ref 49–397)

## 2023-09-15 LAB — RAPID URINE DRUG SCREEN, HOSP PERFORMED
Amphetamines: NOT DETECTED
Barbiturates: NOT DETECTED
Benzodiazepines: NOT DETECTED
Cocaine: NOT DETECTED
Opiates: NOT DETECTED
Tetrahydrocannabinol: NOT DETECTED

## 2023-09-15 LAB — URINALYSIS, ROUTINE W REFLEX MICROSCOPIC
Bacteria, UA: NONE SEEN
Bilirubin Urine: NEGATIVE
Glucose, UA: NEGATIVE mg/dL
Ketones, ur: 20 mg/dL — AB
Leukocytes,Ua: NEGATIVE
Nitrite: NEGATIVE
Protein, ur: 100 mg/dL — AB
Specific Gravity, Urine: 1.028 (ref 1.005–1.030)
pH: 5 (ref 5.0–8.0)

## 2023-09-15 LAB — MAGNESIUM
Magnesium: 2.9 mg/dL — ABNORMAL HIGH (ref 1.7–2.4)
Magnesium: 2.8 mg/dL — ABNORMAL HIGH (ref 1.7–2.4)

## 2023-09-15 LAB — TROPONIN I (HIGH SENSITIVITY)
Troponin I (High Sensitivity): 113 ng/L (ref <18)
Troponin I (High Sensitivity): 95 ng/L — ABNORMAL HIGH (ref <18)
Troponin I (High Sensitivity): 107 ng/L (ref <18)
Troponin I (High Sensitivity): 99 ng/L — ABNORMAL HIGH (ref <18)

## 2023-09-15 LAB — LACTIC ACID, PLASMA
Lactic Acid, Venous: 3 mmol/L (ref 0.5–1.9)
Lactic Acid, Venous: 2.3 mmol/L (ref 0.5–1.9)

## 2023-09-15 LAB — HEMOGLOBIN A1C
Hgb A1c MFr Bld: 6.4 % — ABNORMAL HIGH (ref 4.8–5.6)
Mean Plasma Glucose: 136.98 mg/dL

## 2023-09-15 LAB — PHOSPHORUS: Phosphorus: 4 mg/dL (ref 2.5–4.6)

## 2023-09-15 LAB — CBG MONITORING, ED: Glucose-Capillary: 106 mg/dL — ABNORMAL HIGH (ref 70–99)

## 2023-09-15 LAB — LIPASE, BLOOD: Lipase: 23 U/L (ref 11–51)

## 2023-09-15 MED ORDER — LEVALBUTEROL HCL 0.63 MG/3ML IN NEBU: 0.6300 mg | INHALATION_SOLUTION | Freq: Four times a day (QID) | RESPIRATORY_TRACT | Status: DC | PRN

## 2023-09-15 MED ORDER — FLORANEX PO PACK
1.0000 g | PACK | Freq: Three times a day (TID) | ORAL | Status: DC
  Administered 2023-09-16 – 2023-09-21 (×15): 1 g via ORAL
  Filled 2023-09-15 (×18): qty 1

## 2023-09-15 MED ORDER — VITAMIN C 500 MG PO TABS
500.0000 mg | ORAL_TABLET | Freq: Every day | ORAL | Status: DC
  Administered 2023-09-16 – 2023-09-18 (×3): 500 mg via ORAL
  Filled 2023-09-15 (×3): qty 1

## 2023-09-15 MED ORDER — ONDANSETRON HCL 4 MG/2ML IJ SOLN: 4.0000 mg | Freq: Four times a day (QID) | INTRAMUSCULAR | Status: DC | PRN

## 2023-09-15 MED ORDER — AMLODIPINE BESYLATE 5 MG PO TABS
10.0000 mg | ORAL_TABLET | Freq: Every evening | ORAL | Status: DC
  Administered 2023-09-15 – 2023-09-20 (×6): 10 mg via ORAL
  Filled 2023-09-15 (×6): qty 2

## 2023-09-15 MED ORDER — TIMOLOL MALEATE 0.25 % OP SOLN
1.0000 [drp] | Freq: Two times a day (BID) | OPHTHALMIC | Status: DC
  Administered 2023-09-16 – 2023-09-21 (×8): 1 [drp] via OPHTHALMIC
  Filled 2023-09-15 (×3): qty 5

## 2023-09-15 MED ORDER — HEPARIN SODIUM (PORCINE) 5000 UNIT/ML IJ SOLN
5000.0000 [IU] | Freq: Three times a day (TID) | INTRAMUSCULAR | Status: DC
  Administered 2023-09-15 – 2023-09-21 (×17): 5000 [IU] via SUBCUTANEOUS
  Filled 2023-09-15 (×17): qty 1

## 2023-09-15 MED ORDER — SODIUM CHLORIDE 0.9 % IV SOLN
2.0000 g | Freq: Once | INTRAVENOUS | Status: AC
  Administered 2023-09-15: 2 g via INTRAVENOUS
  Filled 2023-09-15: qty 20

## 2023-09-15 MED ORDER — BISACODYL 5 MG PO TBEC: 5.0000 mg | DELAYED_RELEASE_TABLET | Freq: Every day | ORAL | Status: DC | PRN

## 2023-09-15 MED ORDER — SODIUM CHLORIDE 0.9% FLUSH
3.0000 mL | Freq: Two times a day (BID) | INTRAVENOUS | Status: DC
  Administered 2023-09-16 – 2023-09-20 (×8): 3 mL via INTRAVENOUS

## 2023-09-15 MED ORDER — INSULIN ASPART 100 UNIT/ML IJ SOLN
0.0000 [IU] | Freq: Three times a day (TID) | INTRAMUSCULAR | Status: DC
  Administered 2023-09-16 – 2023-09-19 (×5): 1 [IU] via SUBCUTANEOUS

## 2023-09-15 MED ORDER — SODIUM CHLORIDE 0.9 % IV SOLN
2.0000 g | INTRAVENOUS | Status: DC
  Administered 2023-09-15: 2 g via INTRAVENOUS
  Filled 2023-09-15: qty 20

## 2023-09-15 MED ORDER — OXYCODONE HCL 5 MG PO TABS
5.0000 mg | ORAL_TABLET | ORAL | Status: DC | PRN
  Administered 2023-09-15 – 2023-09-21 (×5): 5 mg via ORAL
  Filled 2023-09-15 (×4): qty 1

## 2023-09-15 MED ORDER — LACTATED RINGERS IV SOLN: INTRAVENOUS | Status: DC

## 2023-09-15 MED ORDER — TRAZODONE HCL 50 MG PO TABS
25.0000 mg | ORAL_TABLET | Freq: Every evening | ORAL | Status: DC | PRN
Start: 2023-09-15 — End: 2023-09-17

## 2023-09-15 MED ORDER — LACTATED RINGERS IV BOLUS
1000.0000 mL | Freq: Once | INTRAVENOUS | Status: AC
  Administered 2023-09-15: 1000 mL via INTRAVENOUS

## 2023-09-15 MED ORDER — VITAMIN E 180 MG (400 UNIT) PO CAPS
400.0000 [IU] | ORAL_CAPSULE | Freq: Every day | ORAL | Status: DC
  Administered 2023-09-16 – 2023-09-21 (×6): 400 [IU] via ORAL
  Filled 2023-09-15 (×8): qty 1

## 2023-09-15 MED ORDER — IPRATROPIUM BROMIDE 0.02 % IN SOLN: 0.5000 mg | Freq: Four times a day (QID) | RESPIRATORY_TRACT | Status: DC | PRN

## 2023-09-15 MED ORDER — ACETAMINOPHEN 650 MG RE SUPP
650.0000 mg | Freq: Four times a day (QID) | RECTAL | Status: DC | PRN
  Administered 2023-09-16: 650 mg via RECTAL
  Filled 2023-09-15: qty 1

## 2023-09-15 MED ORDER — SODIUM CHLORIDE 0.9 % IV BOLUS
1000.0000 mL | Freq: Once | INTRAVENOUS | Status: AC
  Administered 2023-09-15: 1000 mL via INTRAVENOUS

## 2023-09-15 MED ORDER — SODIUM CHLORIDE 0.9% FLUSH
3.0000 mL | Freq: Two times a day (BID) | INTRAVENOUS | Status: DC
  Administered 2023-09-16 – 2023-09-20 (×9): 3 mL via INTRAVENOUS

## 2023-09-15 MED ORDER — SENNOSIDES-DOCUSATE SODIUM 8.6-50 MG PO TABS: 1.0000 | ORAL_TABLET | Freq: Every evening | ORAL | Status: DC | PRN

## 2023-09-15 MED ORDER — IOHEXOL 300 MG/ML  SOLN
100.0000 mL | Freq: Once | INTRAMUSCULAR | Status: AC | PRN
  Administered 2023-09-15: 100 mL via INTRAVENOUS

## 2023-09-15 MED ORDER — CHLORHEXIDINE GLUCONATE CLOTH 2 % EX PADS
6.0000 | MEDICATED_PAD | Freq: Every day | CUTANEOUS | Status: DC
  Administered 2023-09-16 – 2023-09-18 (×3): 6 via TOPICAL

## 2023-09-15 MED ORDER — ASPIRIN 325 MG PO TABS
325.0000 mg | ORAL_TABLET | Freq: Every day | ORAL | Status: DC
  Administered 2023-09-16 – 2023-09-21 (×6): 325 mg via ORAL
  Filled 2023-09-15 (×6): qty 1

## 2023-09-15 MED ORDER — ONDANSETRON HCL 4 MG PO TABS
4.0000 mg | ORAL_TABLET | Freq: Four times a day (QID) | ORAL | Status: DC | PRN
Start: 2023-09-15 — End: 2023-09-21

## 2023-09-15 MED ORDER — ADULT MULTIVITAMIN W/MINERALS CH
1.0000 | ORAL_TABLET | Freq: Every day | ORAL | Status: DC
  Administered 2023-09-16 – 2023-09-21 (×6): 1 via ORAL
  Filled 2023-09-15 (×6): qty 1

## 2023-09-15 MED ORDER — LACTATED RINGERS IV SOLN: INTRAVENOUS | Status: AC

## 2023-09-15 MED ORDER — SODIUM CHLORIDE 0.9 % IV SOLN: INTRAVENOUS | Status: DC

## 2023-09-15 MED ORDER — ACETAMINOPHEN 325 MG PO TABS
650.0000 mg | ORAL_TABLET | Freq: Four times a day (QID) | ORAL | Status: DC | PRN
  Administered 2023-09-20 – 2023-09-21 (×2): 650 mg via ORAL
  Filled 2023-09-15 (×2): qty 2

## 2023-09-15 MED ORDER — FLEET ENEMA RE ENEM: 1.0000 | ENEMA | Freq: Once | RECTAL | Status: DC | PRN

## 2023-09-15 MED ORDER — NIACIN ER (ANTIHYPERLIPIDEMIC) 500 MG PO TBCR
1000.0000 mg | EXTENDED_RELEASE_TABLET | Freq: Every day | ORAL | Status: DC
  Administered 2023-09-16 – 2023-09-20 (×5): 1000 mg via ORAL
  Filled 2023-09-15 (×7): qty 2

## 2023-09-15 MED ORDER — LATANOPROSTENE BUNOD 0.024 % OP SOLN: 1.0000 [drp] | Freq: Every day | OPHTHALMIC | Status: DC

## 2023-09-15 MED ORDER — METOPROLOL TARTRATE 50 MG PO TABS
50.0000 mg | ORAL_TABLET | Freq: Two times a day (BID) | ORAL | Status: DC
  Administered 2023-09-15: 50 mg via ORAL
  Filled 2023-09-15: qty 1

## 2023-09-15 MED ORDER — HYDROMORPHONE HCL 1 MG/ML IJ SOLN: 0.5000 mg | INTRAMUSCULAR | Status: DC | PRN

## 2023-09-15 NOTE — Assessment & Plan Note (Addendum)
 Generalized weakness, status post fall -Severe debility -Per POA needs assist with all ADLs and  - Consulting PT OT for evaluation, consulting TOC for possible placement  - S/p evaluation aside from superficial bruising no signs of traumatic laceration open wound CT head cervical spine chest abdomen all reviewed, negative for any acute traumatic findings  CT head, chest, cervical spine, maxillofacial area follow-up reviewed negative for any acute traumatic findings.  Right renal calculi, the largest 7 mm, but no evidence of passing stone or hydroureteronephrosis. 4. Liquid stool in the colon but no evidence of obstruction or colitis. 5. Lumbar degenerative changes. Potential for at least moderate spinal stenosis at L2-3 and L3-4.  - Fall precautions - MRI of the brain-reviewed, chronic microvascular changes Acute intracranial abnormalities

## 2023-09-15 NOTE — Hospital Course (Addendum)
 Stephen [name] is a 88 year old male with extensive history of constipation, glaucoma, HTN, HLD, DM2, BPH, CKD, GERD, TIA,... Presented to ED after patient was found down by family at home.  Per family patient was found facedown on the tile floor outside the bathroom covered in liquid stool.  Patient  was found awake alert oriented x 2, unable to get up, and hold his weight on his legs.  Visible facial swelling on the left side and bruising on the right cheek.  Was not confirmed, unknown how long patient was down.  Last well-known was Wednesday.  Patient was seen in the ED and was below chief complaint of constipation and impaction 2 days ago. Apparently at home he took a full bottle of magnesium  citrate. At this point patient has no complaints of pain, per family he was complaining of back pain at home. He is noted for progressive weakness and difficult to ambulate now.   ED evaluation: Blood pressure (!) 148/63, pulse 85, temperature 98.3 F (36.8 C), temperature source Rectal, resp. rate 17, height 5' 8 (1.727 m), weight 69.9 kg, SpO2 93%.  Labs: Glucose 140, BUN 37, creatinine 1.31, anion gap 19, creatinine 2.9, AST 334, ALT 75, total bilirubin 1.7, GFR 53, total CK 7000 422, troponin 113, 19, lactic acid 3, 2.3, WBC 19.8,Respiratory panel including;  RSV, influenza A/B COVID all negative, UA large hemoglobin, ketones, negative for bacteria, WBC 0-5, chest x-ray clean, CT head, chest, cervical spine, maxillofacial area follow-up reviewed negative for any acute traumatic findings.  Right renal calculi, the largest 7 mm, but no evidence of passing stone or hydroureteronephrosis. 4. Liquid stool in the colon but no evidence of obstruction or colitis. 5. Lumbar degenerative changes. Potential for at least moderate spinal stenosis at L2-3 and L3-4.

## 2023-09-15 NOTE — Assessment & Plan Note (Addendum)
 Acute on chronic stage II CKD - BUN 37, GFR 53 - Continue IV fluid resuscitation -Avoid nephrotoxin, hypotension  Lab Results  Component Value Date   CREATININE 1.31 (H) 09/15/2023   CREATININE 1.25 (H) 10/06/2019   CREATININE 1.13 01/24/2017

## 2023-09-15 NOTE — H&P (Addendum)
 History and Physical   Patient: Stephen [name]                            PCP: Shona Norleen PEDLAR, MD                    DOB: [birth year]            DOA: 09/15/2023 FMW:[id number]             DOS: 09/15/2023, 3:25 PM  Shona Norleen PEDLAR, MD  Patient coming from:   HOME  I have personally reviewed patient's medical records, in electronic medical records, including:  [facility], and care everywhere.    Chief Complaint:   Chief Complaint  Patient presents with   Altered Mental Status    History of present illness:    Stephen [name] is a 88 year old male with extensive history of constipation, glaucoma, HTN, HLD, DM2, BPH, CKD, GERD, TIA,... Presented to ED after patient was found down by family at home.  Per family patient was found facedown on the tile floor outside the bathroom covered in liquid stool.  Patient  was found awake alert oriented x 2, unable to get up, and hold his weight on his legs.  Visible facial swelling on the left side and bruising on the right cheek.  Was not confirmed, unknown how long patient was down.  Last well-known was Wednesday.  Patient was seen in the ED and was below chief complaint of constipation and impaction 2 days ago. Apparently at home he took a full bottle of magnesium citrate. At this point patient has no complaints of pain, per family he was complaining of back pain at home. He is noted for progressive weakness and difficult to ambulate now.   ED evaluation: Blood pressure (!) 148/63, pulse 85, temperature 98.3 F (36.8 C), temperature source Rectal, resp. rate 17, height 5' 8 (1.727 m), weight 69.9 kg, SpO2 93%.  Labs: Glucose 140, BUN 37, creatinine 1.31, anion gap 19, creatinine 2.9, AST 334, ALT 75, total bilirubin 1.7, GFR 53, total CK 7000 422, troponin 113, 19, lactic acid 3, 2.3, WBC 19.8, Respiratory panel including;  RSV, influenza A/B COVID all negative UA large hemoglobin, ketones, negative for bacteria, WBC 0-5, chest x-ray  clean CT head, chest, cervical spine, maxillofacial area follow-up reviewed negative for any acute traumatic findings.  Right renal calculi, the largest 7 mm, but no evidence of passing stone or hydroureteronephrosis. 4. Liquid stool in the colon but no evidence of obstruction or colitis. 5. Lumbar degenerative changes. Potential for at least moderate spinal stenosis at L2-3 and L3-4.   Patient Denies having: Limited patient is confused POA reports of no known fever, Chills, Cough, SOB, Chest Pain, Abd pain, N/V/D, headache, dizziness, lightheadedness,  Dysuria, Joint pain, rash, or open wounds.     Review of Systems: As per HPI, otherwise 10 point review of systems were negative.   ----------------------------------------------------------------------------------------------------------------------  Allergies  Allergen Reactions   Levofloxacin Other (See Comments)    Severe headache   Benicar Hct [Olmesartan Medoxomil-Hctz] Other (See Comments)    Renal damage   Benicar [Olmesartan]     Other reaction(s): Not available   Celecoxib Other (See Comments)    confusion Other reaction(s): Not available   Clarithromycin      Other reaction(s): Not available   Combigan [Brimonidine Tartrate-Timolol ] Other (See Comments)    Eye irritation    Crestor  [Rosuvastatin ]  Kidney problems   Dorzolamide     Allergic reaction   Doxycycline     Other reaction(s): Not available   Hydralazine Other (See Comments)    Unknown  Other reaction(s): Not available   Latanoprost     Allergic reaction   Levofloxacin     Other reaction(s): Not available   Metronidazole Other (See Comments)    Headache Other reaction(s): Not available   Plavix  [Clopidogrel ]     Caused confusion and constipation   Propoxyphene Other (See Comments)    With Acetaminophen , severe headache and trouble urinating Other reaction(s): Not available   Tetracyclines & Related     Severe headache   Clarithromycin  Rash     Bad Headache    Home MEDs:  Prior to Admission medications   Medication Sig Start Date End Date Taking? Authorizing Provider  amLODipine  (NORVASC ) 10 MG tablet Take 1 tablet (10 mg total) by mouth every evening. 10/06/19  Yes Pearlean Manus, MD  metoprolol  tartrate (LOPRESSOR ) 50 MG tablet Take 1 tablet (50 mg total) by mouth 2 (two) times daily. 10/06/19  Yes Emokpae, Courage, MD  niacin  (NIASPAN ) 1000 MG CR tablet Take 1,000 mg by mouth at bedtime.   Yes [provider]  rosuvastatin  (CRESTOR ) 5 MG tablet Take 5 mg by mouth daily. 10/20/21  Yes [provider]  Alpha-Lipoic Acid 300 MG CAPS Take 1 capsule by mouth 2 (two) times daily.    [provider]  AMBULATORY NON FORMULARY MEDICATION Healthy Prostate  Take 1 tablet by mouth twice daily    [provider]  Ascorbic Acid (VITAMIN C ) 500 MG CAPS Take 500 mg by mouth daily.    [provider]  aspirin  325 MG tablet Take 325 mg by mouth daily.    [provider]  Berberine Chloride POWD Take 500 mg by mouth 2 (two) times daily with a meal.    [provider]  Cholecalciferol (VITAMIN D3) 5000 UNITS TABS Take 1 tablet by mouth daily.    [provider]  Coenzyme Q10 (CO Q 10) 100 MG CAPS Take 1 capsule by mouth 2 (two) times daily.    [provider]  Cyanocobalamin (VITAMIN B12) 3000 MCG/ML LIQD Place 1 tablet under the tongue daily.    [provider]  docusate sodium  (COLACE) 100 MG capsule Take 100 mg by mouth daily.    [provider]  Magnesium 500 MG TABS Take 500 mg by mouth.  Takes 2 daily    [provider]  Misc Natural Products (BLACK CHERRY CONCENTRATE PO) Take 1 capsule by mouth daily.    [provider]  Multiple Vitamin (MULTIVITAMIN WITH MINERALS) TABS tablet Take 1 tablet by mouth daily.    [provider]  Omega 3-6-9 Fatty Acids (TRIPLE OMEGA-3-6-9) CAPS Take 1 capsule by mouth 2 (two) times daily.     [provider]  timolol  (TIMOPTIC ) 0.25 % ophthalmic solution Place 1 drop into both eyes 2 (two) times daily.    [provider]  vitamin E  180 MG (400 UNITS) capsule Take 400 Units by mouth daily.    [provider]  VYZULTA  0.024 % SOLN Place 1 drop into both eyes at bedtime. 08/25/23   [provider]    PRN MEDs: acetaminophen  **OR** acetaminophen , bisacodyl , HYDROmorphone  (DILAUDID ) injection, ipratropium, levalbuterol , ondansetron  **OR** ondansetron  (ZOFRAN ) IV, oxyCODONE , senna-docusate, sodium phosphate, traZODone   Past Medical History:  Diagnosis Date   Adenomatous colon polyp    Allergy  Arthritis    BPH (benign prostatic hypertrophy)    Cataract    bil cataracts removed   Chest pain    CKD (chronic kidney disease)    Diabetes mellitus without complication (HCC)    controlled with diet   Diverticulosis    GERD (gastroesophageal reflux disease)    Glaucoma    Helicobacter pylori (H. pylori)    Hemorrhoids    Hyperlipidemia    Hypertension    Kidney stones    TIA (transient ischemic attack)    side affect from medication had symptoms of TIA   UTI (lower urinary tract infection)     Past Surgical History:  Procedure Laterality Date   CATARACT EXTRACTION Bilateral    COLONOSCOPY     CYSTOSCOPY WITH STENT PLACEMENT Left 05/01/2012   Procedure: CYSTOSCOPY WITH URETEROSCOPY AND STONE EXTRACTION, LEFT STENT PLACEMENT ;  Surgeon: Norleen JINNY Seltzer, MD;  Location: [facility];  Service: Urology;  Laterality: Left;  STONE EXTRACTION  STENT PLACEMENT     ESOPHAGOGASTRODUODENOSCOPY ENDOSCOPY     LITHOTRIPSY       reports that he has never smoked. He has never been exposed to tobacco smoke. He has never used smokeless tobacco. He reports that he does not drink alcohol and does not use drugs.   Family History  Problem Relation Age of Onset   Heart attack Father    Pancreatic cancer Brother    Colon polyps Brother    Lung cancer Brother     Heart disease Brother    Colon cancer Brother    Prostate cancer Brother    Diabetes Brother        x 3 broderline DM   Esophageal cancer Neg Hx    Stomach cancer Neg Hx     Physical Exam:   Vitals:   09/15/23 1145 09/15/23 1200 09/15/23 1215 09/15/23 1430  BP: (!) 163/77 (!) 174/65 (!) 148/63 (!) 165/74  Pulse: 72 73 85 79  Resp: 17 16 17 16   Temp:    98.7 F (37.1 C)  TempSrc:    Rectal  SpO2: 98% 95% 93% 93%  Weight:      Height:       Constitutional: Confused, laying in bed comfortably calm, comfortable Eyes: PERRL, lids and conjunctivae normal ENMT: Mucous membranes are moist. Posterior pharynx clear of any exudate or lesions.Normal dentition.  Neck: normal, supple, no masses, no thyromegaly Respiratory: clear to auscultation bilaterally, no wheezing, no crackles. Normal respiratory effort. No accessory muscle use.  Cardiovascular: Regular rate and rhythm, no murmurs / rubs / gallops. No extremity edema. 2+ pedal pulses. No carotid bruits.  Abdomen: no tenderness, no masses palpated. No hepatosplenomegaly. Bowel sounds positive.  Musculoskeletal: Global generalized weakness, unsteady gait no clubbing / cyanosis. No joint deformity upper and lower extremities. Good ROM, no contractures. Normal muscle tone.  Neurologic: CN II-XII grossly intact. Sensation intact, DTR normal.  Strength 5/5 in all 4.  Psychiatric: Normal judgment and insight. Alert and oriented x 3. Normal mood.  Skin: no rashes, lesions, ulcers. No induration Wounds: per nursing documentation     Labs on admission:    I have personally reviewed following labs and imaging studies  CBC: Recent Labs  Lab 09/15/23 0905  WBC 19.8*  NEUTROABS 16.0*  HGB 16.2  HCT 46.3  MCV 93.7  PLT 175   Basic Metabolic Panel: Recent Labs  Lab 09/15/23 0905 09/15/23 1223  NA 139  --   K 4.5  --  CL 99  --   CO2 21*  --   GLUCOSE 140*  --   BUN 37*  --   CREATININE 1.31*  --   CALCIUM  9.3  --   MG  2.9* 2.8*  PHOS  --  4.0   GFR: Estimated Creatinine Clearance: 39.2 mL/min (A) (by C-G formula based on SCr of 1.31 mg/dL (H)). Liver Function Tests: Recent Labs  Lab 09/15/23 0905  AST 334*  ALT 75*  ALKPHOS 81  BILITOT 1.7*  PROT 7.3  ALBUMIN 3.8   Recent Labs  Lab 09/15/23 0905  LIPASE 23   Recent Labs  Lab 09/15/23 0905  AMMONIA <13   Coagulation Profile: No results for input(s): INR, PROTIME in the last 168 hours. Cardiac Enzymes: Recent Labs  Lab 09/15/23 0905  CKTOTAL 7,422*    Urine analysis:    Component Value Date/Time   COLORURINE AMBER (A) 09/15/2023 0936   APPEARANCEUR CLEAR 09/15/2023 0936   APPEARANCEUR Clear 12/06/2019 1424   LABSPEC 1.028 09/15/2023 0936   PHURINE 5.0 09/15/2023 0936   GLUCOSEU NEGATIVE 09/15/2023 0936   HGBUR LARGE (A) 09/15/2023 0936   BILIRUBINUR NEGATIVE 09/15/2023 0936   BILIRUBINUR Negative 12/06/2019 1424   KETONESUR 20 (A) 09/15/2023 0936   PROTEINUR 100 (A) 09/15/2023 0936   UROBILINOGEN 0.2 05/26/2012 1304   NITRITE NEGATIVE 09/15/2023 0936   LEUKOCYTESUR NEGATIVE 09/15/2023 0936    Last A1C:  Lab Results  Component Value Date   HGBA1C 6.4 (H) 07/02/2013     Radiologic Exams on Admission:   DG Shoulder Left Result Date: 09/15/2023 CLINICAL DATA:  Fall EXAM: LEFT SHOULDER - 2+ VIEW COMPARISON:  None available FINDINGS: Irregularity of the greater tuberosity indicative of rotator cuff tendinopathy. Mild degenerative changes of the glenohumeral joint. No fracture or dislocation. IMPRESSION: No acute abnormality of the LEFT shoulder. Electronically Signed   By: Aliene Lloyd M.D.   On: 09/15/2023 12:35   CT Head Wo Contrast Result Date: 09/15/2023 CLINICAL DATA:  Found on the floor.  Altered mental status. EXAM: CT HEAD WITHOUT CONTRAST CT MAXILLOFACIAL WITHOUT CONTRAST CT CERVICAL SPINE WITHOUT CONTRAST TECHNIQUE: Multidetector CT imaging of the head, cervical spine, and maxillofacial structures were  performed using the standard protocol without intravenous contrast. Multiplanar CT image reconstructions of the cervical spine and maxillofacial structures were also generated. RADIATION DOSE REDUCTION: This exam was performed according to the departmental dose-optimization program which includes automated exposure control, adjustment of the mA and/or kV according to patient size and/or use of iterative reconstruction technique. COMPARISON:  11/12/2019 FINDINGS: CT HEAD FINDINGS Brain: Age related volume loss. Chronic small-vessel ischemic changes of the white matter. No sign of acute infarction, mass lesion, hemorrhage, hydrocephalus or extra-axial collection. Vascular: There is atherosclerotic calcification of the major vessels at the base of the brain. Skull: Negative Other: None CT MAXILLOFACIAL FINDINGS Osseous: No facial fracture. Orbits: No intraorbital injury. Sinuses: Sinuses are clear. Soft tissues: Mild soft tissue swelling of the face. CT CERVICAL SPINE FINDINGS Alignment: No malalignment. Skull base and vertebrae: No regional fracture. Solid bridging anterior osteophytes from C5 through C7. Soft tissues and spinal canal: No traumatic soft tissue finding. 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 Mild osteoarthritis at the C1-2 articulation. C2-3: Facet osteoarthritis on the right. Moderate right foraminal stenosis. C3-4: Endplate and uncovertebral osteophytes more prominent on the left. Bilateral facet degeneration worse on the left. Left foraminal stenosis that could cause neural compression.  C4-5: Endplate and uncovertebral osteophytes worse on the right. Facet degeneration worse on the right. Right foraminal stenosis that could cause neural compression. C5 through C7: Fusion of the vertebral bodies. No compressive canal stenosis. Mild bilateral chronic bony foraminal narrowing. C7-T1: Anterior osteophytes but without complete solid union. No  stenosis of the canal or foramina. Upper chest: Negative Other: IMPRESSION: HEAD CT: No acute or traumatic finding. Age related volume loss and chronic small-vessel ischemic changes of the white matter. MAXILLOFACIAL CT: No facial fracture. Mild soft tissue swelling of the face. CERVICAL SPINE CT: No acute or traumatic finding. Chronic degenerative changes as outlined above. Electronically Signed   By: Oneil Officer M.D.   On: 09/15/2023 11:25   CT Cervical Spine Wo Contrast Result Date: 09/15/2023 CLINICAL DATA:  Found on the floor.  Altered mental status. EXAM: CT HEAD WITHOUT CONTRAST CT MAXILLOFACIAL WITHOUT CONTRAST CT CERVICAL SPINE WITHOUT CONTRAST TECHNIQUE: Multidetector CT imaging of the head, cervical spine, and maxillofacial structures were performed using the standard protocol without intravenous contrast. Multiplanar CT image reconstructions of the cervical spine and maxillofacial structures were also generated. RADIATION DOSE REDUCTION: This exam was performed according to the departmental dose-optimization program which includes automated exposure control, adjustment of the mA and/or kV according to patient size and/or use of iterative reconstruction technique. COMPARISON:  11/12/2019 FINDINGS: CT HEAD FINDINGS Brain: Age related volume loss. Chronic small-vessel ischemic changes of the white matter. No sign of acute infarction, mass lesion, hemorrhage, hydrocephalus or extra-axial collection. Vascular: There is atherosclerotic calcification of the major vessels at the base of the brain. Skull: Negative Other: None CT MAXILLOFACIAL FINDINGS Osseous: No facial fracture. Orbits: No intraorbital injury. Sinuses: Sinuses are clear. Soft tissues: Mild soft tissue swelling of the face. CT CERVICAL SPINE FINDINGS Alignment: No malalignment. Skull base and vertebrae: No regional fracture. Solid bridging anterior osteophytes from C5 through C7. Soft tissues and spinal canal: No traumatic soft tissue  finding. 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 Mild osteoarthritis at the C1-2 articulation. C2-3: Facet osteoarthritis on the right. Moderate right foraminal stenosis. C3-4: Endplate and uncovertebral osteophytes more prominent on the left. Bilateral facet degeneration worse on the left. Left foraminal stenosis that could cause neural compression. C4-5: Endplate and uncovertebral osteophytes worse on the right. Facet degeneration worse on the right. Right foraminal stenosis that could cause neural compression. C5 through C7: Fusion of the vertebral bodies. No compressive canal stenosis. Mild bilateral chronic bony foraminal narrowing. C7-T1: Anterior osteophytes but without complete solid union. No stenosis of the canal or foramina. Upper chest: Negative Other: IMPRESSION: HEAD CT: No acute or traumatic finding. Age related volume loss and chronic small-vessel ischemic changes of the white matter. MAXILLOFACIAL CT: No facial fracture. Mild soft tissue swelling of the face. CERVICAL SPINE CT: No acute or traumatic finding. Chronic degenerative changes as outlined above. Electronically Signed   By: Oneil Officer M.D.   On: 09/15/2023 11:25   CT Maxillofacial WO CM Result Date: 09/15/2023 CLINICAL DATA:  Found on the floor.  Altered mental status. EXAM: CT HEAD WITHOUT CONTRAST CT MAXILLOFACIAL WITHOUT CONTRAST CT CERVICAL SPINE WITHOUT CONTRAST TECHNIQUE: Multidetector CT imaging of the head, cervical spine, and maxillofacial structures were performed using the standard protocol without intravenous contrast. Multiplanar CT image reconstructions of the cervical spine and maxillofacial structures were also generated. RADIATION DOSE REDUCTION: This exam was performed according to the departmental dose-optimization program which includes automated exposure control,  adjustment of the mA and/or kV according to patient size and/or use of iterative  reconstruction technique. COMPARISON:  11/12/2019 FINDINGS: CT HEAD FINDINGS Brain: Age related volume loss. Chronic small-vessel ischemic changes of the white matter. No sign of acute infarction, mass lesion, hemorrhage, hydrocephalus or extra-axial collection. Vascular: There is atherosclerotic calcification of the major vessels at the base of the brain. Skull: Negative Other: None CT MAXILLOFACIAL FINDINGS Osseous: No facial fracture. Orbits: No intraorbital injury. Sinuses: Sinuses are clear. Soft tissues: Mild soft tissue swelling of the face. CT CERVICAL SPINE FINDINGS Alignment: No malalignment. Skull base and vertebrae: No regional fracture. Solid bridging anterior osteophytes from C5 through C7. Soft tissues and spinal canal: No traumatic soft tissue finding. 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 Mild osteoarthritis at the C1-2 articulation. C2-3: Facet osteoarthritis on the right. Moderate right foraminal stenosis. C3-4: Endplate and uncovertebral osteophytes more prominent on the left. Bilateral facet degeneration worse on the left. Left foraminal stenosis that could cause neural compression. C4-5: Endplate and uncovertebral osteophytes worse on the right. Facet degeneration worse on the right. Right foraminal stenosis that could cause neural compression. C5 through C7: Fusion of the vertebral bodies. No compressive canal stenosis. Mild bilateral chronic bony foraminal narrowing. C7-T1: Anterior osteophytes but without complete solid union. No stenosis of the canal or foramina. Upper chest: Negative Other: IMPRESSION: HEAD CT: No acute or traumatic finding. Age related volume loss and chronic small-vessel ischemic changes of the white matter. MAXILLOFACIAL CT: No facial fracture. Mild soft tissue swelling of the face. CERVICAL SPINE CT: No acute or traumatic finding. Chronic degenerative changes as outlined above. Electronically Signed   By:  Oneil Officer M.D.   On: 09/15/2023 11:25   CT CHEST ABDOMEN PELVIS W CONTRAST Result Date: 09/15/2023 CLINICAL DATA:  Found on the floor. Loss of bowel control. Left-sided facial swelling. EXAM: CT CHEST, ABDOMEN, AND PELVIS WITH CONTRAST TECHNIQUE: Multidetector CT imaging of the chest, abdomen and pelvis was performed following the standard protocol during bolus administration of intravenous contrast. RADIATION DOSE REDUCTION: This exam was performed according to the departmental dose-optimization program which includes automated exposure control, adjustment of the mA and/or kV according to patient size and/or use of iterative reconstruction technique. CONTRAST:  OMNIPAQUE  IOHEXOL  300 MG/ML  SOLN COMPARISON:  None Available. FINDINGS: CT CHEST FINDINGS Cardiovascular: Normal heart size. No pericardial fluid. Some coronary artery calcification is present. Some aortic atherosclerotic calcification is present. No evidence of aneurysm. Mediastinum/Nodes: No mass or adenopathy. Lungs/Pleura: No pleural disease. No underlying emphysema. No mass. No nodule in need of further follow-up. Benign 2 mm subpleural nodule in the posterior right upper lobe. Musculoskeletal: No regional fracture. Chronic spinal degenerative changes with bridging osteophytes. CT ABDOMEN PELVIS FINDINGS Hepatobiliary: Normal Pancreas: Normal Spleen: Normal Adrenals/Urinary Tract: Adrenal glands are normal. Mild atrophic change of the left kidney without focal finding or obstruction. Right kidney is normal size and contains multiple stones, the largest 7 mm, but no evidence of passing stone or hydroureteronephrosis. Bladder appears normal. Stomach/Bowel: Stomach and small intestine are unremarkable. Normal appendix. Liquid stool in the colon but no evidence of obstruction or colitis. Vascular/Lymphatic: No lymphadenopathy. Aortic atherosclerosis without aneurysm. Reproductive: Normal Other: No free fluid or air. Musculoskeletal: Lumbar  degenerative changes. Potential for at least moderate spinal stenosis at L2-3 and L3-4. IMPRESSION: 1. No acute or traumatic finding. 2. Aortic atherosclerosis. Coronary artery calcification. 3. Right renal calculi, the largest 7  mm, but no evidence of passing stone or hydroureteronephrosis. 4. Liquid stool in the colon but no evidence of obstruction or colitis. 5. Lumbar degenerative changes. Potential for at least moderate spinal stenosis at L2-3 and L3-4. Aortic Atherosclerosis (ICD10-I70.0). Electronically Signed   By: Oneil Officer M.D.   On: 09/15/2023 11:20   DG Abdomen 1 View Result Date: 09/13/2023 CLINICAL DATA:  Constipation/fecal impaction. EXAM: ABDOMEN - 1 VIEW COMPARISON:  10/22/2021. FINDINGS: The bowel gas pattern is non-obstructive.  No abnormal stool burden. No evidence of pneumoperitoneum. No acute osseous abnormalities. The soft tissues are within normal limits. Surgical changes, devices, tubes and lines: None. IMPRESSION: Nonobstructive bowel gas pattern. No abnormal stool burden. Electronically Signed   By: Ree Molt M.D.   On: 09/13/2023 17:44    EKG:   Independently reviewed.  Orders placed or performed during the hospital encounter of 09/15/23   ED EKG   ED EKG   EKG 12-Lead   EKG 12-Lead   Repeat EKG   Repeat EKG   EKG 12-Lead   EKG 12-Lead   EKG 12-Lead   ---------------------------------------------------------------------------------------------------------------------------------------    Assessment / Plan:   Principal Problem:   Sepsis (HCC) Active Problems:   Rhabdomyolysis   Lactic acidosis   Elevated troponin   Falls   Acute metabolic encephalopathy   Essential hypertension   Glaucoma, both eyes   Stage 2 chronic kidney disease   Carotid artery stenosis   Chronic constipation   Transaminitis   Mixed hyperlipidemia   Type 2 diabetes mellitus (HCC)   Assessment and Plan: * Sepsis (HCC) - POA met SIRS criteria, with leukocytosis: WBC  19.8, creatinine 1.31, lactic acid 3.0,  - Ruling out sepsis -No source of infection, UA negative chest x-ray clean Presentation likely due to diarrhea, dehydration, lactic acidosis Remain afebrile, normotensive, -Continue IV fluid resuscitation continue empiric antibiotic with Rocephin   Acute metabolic encephalopathy - Metabolic encephalopathy with confusion, awake alert oriented x 2, generalized weakness, difficult gait -CT head-reviewed negative -Continue neurochecks -History of TIA, continue aspirin , with holding statins due to transaminitis, rhabdomyolysis  Falls Generalized weakness, status post fall - S/p evaluation aside from superficial bruising no signs of traumatic laceration open wound CT head cervical spine chest abdomen all reviewed, negative for any acute traumatic findings  CT head, chest, cervical spine, maxillofacial area follow-up reviewed negative for any acute traumatic findings.  Right renal calculi, the largest 7 mm, but no evidence of passing stone or hydroureteronephrosis. 4. Liquid stool in the colon but no evidence of obstruction or colitis. 5. Lumbar degenerative changes. Potential for at least moderate spinal stenosis at L2-3 and L3-4.  - Fall precautions - Consulting PT OT for evaluation recommendations   Elevated troponin - Elevated troponin with elevated total CK likely ischemic demand - Denies any chest pain negative for any acute changes EKG -Will continue to monitor closely  - Recycle cardiac enzymes  - As needed nitroglycerin, aspirin , analgesics  Lactic acidosis - Lactic acid 3.0, WBC 19.8 -Meeting SIRS criteria (afebrile, normotensive) - UA-negative, chest x-ray within normal limits-no source of infection -Likely reactive -Empiric antibiotics initiated in ED will continue for now -Follow-up with blood cultures  Rhabdomyolysis Due to poor wound mobility was found down - Total CK elevated to 7,422 - With AKI with a creatinine of  1.31 -S/p aggressive IV fluid resuscitation-continue maintenance IV fluid with LR 150 mL -Will monitor CK and creatinine closely  Transaminitis -Monitoring LFTs - Obtaining hepatitis panel - Avoiding hepatotoxins  Chronic  constipation C/o loose stool-diarrhea - Status post ingestion of possible multiple bottles of mag citrate at home -Withholding bowel regimen -Monitoring closely  Carotid artery stenosis - Continue aspirin , holding statins (due to transaminitis)  Stage 2 chronic kidney disease Acute on chronic stage II CKD - BUN 37, GFR 53 - Continue IV fluid resuscitation -Avoid nephrotoxin, hypotension  Lab Results  Component Value Date   CREATININE 1.31 (H) 09/15/2023   CREATININE 1.25 (H) 10/06/2019   CREATININE 1.13 01/24/2017      Glaucoma, both eyes Will continue eyedrop regimen including timolol , high-dose vitamin E   Essential hypertension - Currently hypertensive,  - Resuming home medication of Norvasc  and metoprolol  -Along with as needed hydralazine  Type 2 diabetes mellitus (HCC) - Holding home medication, checking CBG q. ACH, SSI coverage  Mixed hyperlipidemia - Holding home medication of Crestor  due to transaminitis, and rhabdo myelitis     Consults called: PT/OT, TOC -------------------------------------------------------------------------------------------------------------------------------------------- DVT prophylaxis:  heparin  injection 5,000 Units Start: 09/15/23 1400 SCDs Start: 09/15/23 1220   Code Status:   Code Status: Do not attempt resuscitation (DNR) PRE-ARREST INTERVENTIONS DESIRED   Admission status: Patient will be admitted as Observation, with a greater than 2 midnight length of stay. Level of care: Med-Surg   Family Communication: POA nephew Nyshaun Standage present at bedside discussed goals of care, plan of care, CODE STATUS-confirmed DNR/DNI status (The above findings and plan of care has been discussed with patient in  detail, the patient expressed understanding and agreement of above plan)   Patient lives alone, no longer can live independently.  -----------------------------------------------------------------------------------------------------------------------------------------  Disposition Plan:  Anticipated 1-2 days Status is: Observation The patient remains OBS appropriate and will d/c before 2 midnights.  -------------------------------------------------------------------------------------------------------------------  Time spent:  88  Min.  Was spent seeing and evaluating the patient, reviewing all medical records, drawn plan of care.  SIGNED: Adriana DELENA Grams, MD, FHM. FAAFP. [place] [facility], Pager  (Please use [email or web address] to page/ or secure chat through epic) If 7PM-7AM, please contact night-coverage [email or web address],  09/15/2023, 3:25 PM

## 2023-09-15 NOTE — Assessment & Plan Note (Signed)
-  Monitoring LFTs - Obtaining hepatitis panel - Avoiding hepatotoxins

## 2023-09-15 NOTE — Assessment & Plan Note (Addendum)
-   Denies any chest pain - Continue aspirin , holding statins (due to transaminitis)

## 2023-09-15 NOTE — Assessment & Plan Note (Addendum)
-   Holding home medication of Crestor  due to transaminitis, and rhabdo myelitis

## 2023-09-15 NOTE — Progress Notes (Signed)
 Pt continued to have intermittent elevated heart rate. Per [place], obtain EKG and transfer to ICU. Report given to Cody,RN. Bertina Roark PEAK

## 2023-09-15 NOTE — Assessment & Plan Note (Addendum)
-   Metabolic encephalopathy with confusion,  - Improved mentation today, awake alert oriented x 3 CT head negative, MRI of the brain reviewed in detail-negative for any acute changes chronic microvascular changes - Will continue with neurochecks  -History of TIA, continue aspirin , with holding statins due to transaminitis, rhabdomyolysis

## 2023-09-15 NOTE — Assessment & Plan Note (Addendum)
 Denies any chest pain this morning - Elevated troponin with elevated total CK likely ischemic demand -No acute changes in EKG -Will continue to monitor closely  - Troponin trending down  - As needed nitroglycerin, aspirin , analgesics

## 2023-09-15 NOTE — ED Triage Notes (Signed)
 Pt arrived to ED via [facility]. EMS called out to home by family who lives with pt. Pt was found face down on tile floor outside the bathroom covered in liquid dark stool. EMS states that there was evidence that the patient had attempted to get off the floor. EMS states it looks like pt was swimming on floor in stool. Upon presentation to ED pt is A and Ox2. Pt unable to raise legs and hold. Pt can hold arms up and hold. Visibile face swelling on left side with bruising on right cheek. It is unknown how long pt was down. Last known well was Wednesday per POA.

## 2023-09-15 NOTE — Assessment & Plan Note (Addendum)
-   Hypertensive this a.m. - Resuming home medication of Norvasc  and metoprolol  -Along with as needed hydralazine

## 2023-09-15 NOTE — Assessment & Plan Note (Signed)
 Due to poor wound mobility was found down - Total CK elevated to 7,422 - With AKI with a creatinine of 1.31 -S/p aggressive IV fluid resuscitation-continue maintenance IV fluid with LR 150 mL -Will monitor CK and creatinine closely

## 2023-09-15 NOTE — Assessment & Plan Note (Addendum)
 C/o loose stool-diarrhea -No further diarrhea episode overnight - Status post ingestion of possible multiple bottles of mag citrate at home -Withholding bowel regimen -Monitoring closely

## 2023-09-15 NOTE — Assessment & Plan Note (Addendum)
 Lactic acidosis due to sepsis-ruled in - Lactic acid 3.0>> 2.3 WBC 19.8 >> 12.6, -Afebrile hypertensive -Blood cultures are growing gram-positive cocci -Antibiotic switched from Rocephin  to vancomycin  today 09/16/2023  - UA-negative, chest x-ray within normal limits-no source of infection

## 2023-09-15 NOTE — Assessment & Plan Note (Signed)
-   POA met SIRS criteria, with leukocytosis: WBC 19.8, creatinine 1.31, lactic acid 3.0,  - Ruling out sepsis -No source of infection, UA negative chest x-ray clean Presentation likely due to diarrhea, dehydration, lactic acidosis Remain afebrile, normotensive, -Continue IV fluid resuscitation continue empiric antibiotic with Rocephin

## 2023-09-15 NOTE — ED Provider Notes (Signed)
 [place] EMERGENCY DEPARTMENT AT [facility] Provider Note   CSN: [id number] Arrival date & time: 09/15/23  9153     Patient presents with: Altered Mental Status   Stephen [name] is a 88 y.o. male.   Patient is an 88 year old male who presents emergency department with his nephew following a fall which occurred last night per the patient.  He is altered at this point and only oriented x 2.  It is exactly unclear exactly how long the patient was lying on the ground but was found by family and EMS to be lying in a pool of stool.  Patient did go to the [facility] emergency department approximately 2 days ago secondary to possible constipation and impaction though was not seen at that point.  He did take a full bottle of magnesium  citrate.  On presentation the patient is not complaining of any pain but family notes that he has been complaining of pain to his lower back.  He is having increased weakness as well as difficulty with moving both of his lower extremities.  Last time that he was seen by the family was approximately 2 days ago.   Altered Mental Status      Prior to Admission medications   Medication Sig Start Date End Date Taking? Authorizing Provider  Alpha-Lipoic Acid 300 MG CAPS Take 1 capsule by mouth 2 (two) times daily.    [provider]  AMBULATORY NON FORMULARY MEDICATION Healthy Prostate  Take 1 tablet by mouth twice daily    [provider]  amLODipine  (NORVASC ) 10 MG tablet Take 1 tablet (10 mg total) by mouth every evening. 10/06/19   Pearlean Manus, MD  amLODipine  (NORVASC ) 10 MG tablet Take 1 tablet by mouth daily.    [provider]  Ascorbic Acid  (VITAMIN C ) 500 MG CAPS Take 500 mg by mouth daily.    [provider]  aspirin  325 MG tablet Take 325 mg by mouth daily.    [provider]  Berberine Chloride POWD Take 500 mg by mouth 2 (two) times daily with a meal.    [provider]   Cholecalciferol  (VITAMIN D3) 5000 UNITS TABS Take 1 tablet by mouth daily.    [provider]  Coenzyme Q10 (CO Q 10) 100 MG CAPS Take 1 capsule by mouth 2 (two) times daily.    [provider]  Cyanocobalamin  (VITAMIN B12) 3000 MCG/ML LIQD Place 1 tablet under the tongue daily.    [provider]  docusate sodium  (COLACE) 100 MG capsule Take 100 mg by mouth daily.    [provider]  Magnesium  500 MG TABS Take 500 mg by mouth.  Takes 2 daily    [provider]  metoprolol  tartrate (LOPRESSOR ) 50 MG tablet Take 1 tablet (50 mg total) by mouth 2 (two) times daily. 10/06/19   Pearlean Manus, MD  Misc Natural Products (BLACK CHERRY CONCENTRATE PO) Take 1 capsule by mouth daily.    [provider]  Multiple Vitamin (MULTIVITAMIN WITH MINERALS) TABS tablet Take 1 tablet by mouth daily.    [provider]  niacin  (NIASPAN ) 1000 MG CR tablet Take 1,000 mg by mouth at bedtime.    [provider]  Omega 3-6-9 Fatty Acids (TRIPLE OMEGA-3-6-9) CAPS Take 1 capsule by mouth 2 (two) times daily.    [provider]  OVER THE COUNTER MEDICATION L- Carinitin 500mg  one bid    [provider]  OVER THE COUNTER MEDICATION N -  Acetyl cysteine 600mg  one bid    [provider]  OVER THE COUNTER MEDICATION Vitamin K -2 150 mcg in the morning    [provider]  OVER THE COUNTER MEDICATION Vision essentials ultra - 4 capsules daily with a meal    [provider]  OVER THE COUNTER MEDICATION Cardiosense probiotic one daily    [provider]  OVER THE COUNTER MEDICATION Omepga Q plus max - 2 capsules daily    [provider]  rosuvastatin  (CRESTOR ) 5 MG tablet Take 5 mg by mouth daily. 10/20/21   [provider]  timolol  (BETIMOL ) 0.5 % ophthalmic solution Place 1 drop into both eyes 2 (two) times daily.     [provider]  vitamin E  180 MG (400 UNITS) capsule Take 400  Units by mouth daily.    [provider]    Allergies: Levofloxacin, Benicar hct [olmesartan medoxomil-hctz], Benicar [olmesartan], Celecoxib, Clarithromycin , Combigan [brimonidine tartrate-timolol ], Crestor  [rosuvastatin ], Dorzolamide, Doxycycline, Hydralazine, Latanoprost, Levofloxacin, Metronidazole, Plavix  [clopidogrel ], Propoxyphene, Tetracyclines & related, and Clarithromycin     Review of Systems  Constitutional:  Positive for fatigue.  Neurological:        Altered mental status  All other systems reviewed and are negative.   Updated Vital Signs Ht 5' 8 (1.727 m)   Wt 69.9 kg   BMI 23.43 kg/m   Physical Exam Vitals and nursing note reviewed.  Constitutional:      General: He is not in acute distress.    Appearance: Normal appearance. He is not ill-appearing or toxic-appearing.  HENT:     Head: Normocephalic and atraumatic.     Comments: Swelling noted to the left of the face and left periorbital region    Nose: Nose normal.     Mouth/Throat:     Mouth: Mucous membranes are moist.  Eyes:     Extraocular Movements: Extraocular movements intact.     Conjunctiva/sclera: Conjunctivae normal.     Pupils: Pupils are equal, round, and reactive to light.  Cardiovascular:     Rate and Rhythm: Normal rate and regular rhythm.     Pulses: Normal pulses.     Heart sounds: Normal heart sounds. No murmur heard.    No gallop.  Pulmonary:     Effort: Pulmonary effort is normal. No respiratory distress.     Breath sounds: Normal breath sounds. No stridor. No wheezing, rhonchi or rales.  Chest:     Chest wall: No tenderness.  Abdominal:     General: Abdomen is flat. Bowel sounds are normal. There is no distension.     Palpations: Abdomen is soft.     Tenderness: There is no abdominal tenderness. There is no guarding.  Musculoskeletal:        General: Normal range of motion.     Cervical back: Normal range of motion and neck supple. No rigidity or tenderness.      Comments: Nontender to palpation of her bilateral upper and lower extremities, pelvis stable to AP and lateral compression, peripheral pulses 2+ throughout, sensation intact throughout, full range of motion noted throughout, no obvious deformity or bruising, no skin breakdown or ulceration, no lacerations or abrasions, nontender palpation over thoracic and lumbar spine  Skin:    General: Skin is warm and dry.     Findings: No rash.     Comments: No lacerations  Neurological:     General: No focal deficit present.     Mental Status: He is alert and oriented to person,  place, and time. Mental status is at baseline.     Cranial Nerves: No cranial nerve deficit.     Sensory: No sensory deficit.     Motor: No weakness.     Coordination: Coordination normal.  Psychiatric:        Mood and Affect: Mood normal.        Behavior: Behavior normal.        Thought Content: Thought content normal.        Judgment: Judgment normal.     (all labs ordered are listed, but only abnormal results are displayed) Labs Reviewed  CULTURE, BLOOD (ROUTINE X 2)  CULTURE, BLOOD (ROUTINE X 2)  RESP PANEL BY RT-PCR (RSV, FLU A&B, COVID)  [id number]  COMPREHENSIVE METABOLIC PANEL WITH GFR  LACTIC ACID, PLASMA  LACTIC ACID, PLASMA  LIPASE, BLOOD  CBC WITH DIFFERENTIAL/PLATELET  URINALYSIS, ROUTINE W REFLEX MICROSCOPIC  CK  MAGNESIUM   AMMONIA  TROPONIN I (HIGH SENSITIVITY)    EKG: None  Radiology: DG Abdomen 1 View Result Date: 09/13/2023 CLINICAL DATA:  Constipation/fecal impaction. EXAM: ABDOMEN - 1 VIEW COMPARISON:  10/22/2021. FINDINGS: The bowel gas pattern is non-obstructive.  No abnormal stool burden. No evidence of pneumoperitoneum. No acute osseous abnormalities. The soft tissues are within normal limits. Surgical changes, devices, tubes and lines: None. IMPRESSION: Nonobstructive bowel gas pattern. No abnormal stool burden. Electronically Signed   By: Ree Molt M.D.   On: 09/13/2023 17:44      .Critical Care  Performed by: Stephen Stephen BIRCH, PA-C Authorized by: Stephen Stephen BIRCH, PA-C   Critical care provider statement:    Critical care time (minutes):  35   Critical care was necessary to treat or prevent imminent or life-threatening deterioration of the following conditions:  Dehydration (Rhabdomyolysis)   Critical care was time spent personally by me on the following activities:  Development of treatment plan with patient or surrogate, discussions with consultants, evaluation of patient's response to treatment, examination of patient, ordering and review of laboratory studies, ordering and review of radiographic studies, ordering and performing treatments and interventions, pulse oximetry, re-evaluation of patient's condition and review of old charts   I assumed direction of critical care for this patient from another provider in my specialty: no     Care discussed with: admitting provider      Medications Ordered in the ED  sodium chloride  0.9 % bolus 1,000 mL (has no administration in time range)                                    Medical Decision Making Amount and/or Complexity of Data Reviewed Labs: ordered. Radiology: ordered.  Risk Prescription drug management. Decision regarding hospitalization.   This patient presents to the ED for concern of altered mental status, fall, this involves an extensive number of treatment options, and is a complaint that carries with it a high risk of complications and morbidity.  The differential diagnosis includes rhabdomyolysis, acute kidney injury, electrolyte derangement, ACS, vertebral fracture, intracranial hemorrhage, long bone or joint fracture, sepsis, pneumonia, urinary tract infection   Co morbidities that complicate the patient evaluation  Hypertension and hyperlipidemia   Additional history obtained:  Additional history obtained from family External records from outside source obtained and reviewed  including medical records   Lab Tests:  I Ordered, and personally interpreted labs.  The pertinent results include: Noted leukocytosis, no anemia, elevated creatinine, normal electrolytes, elevated  magnesium , elevated ALT and total bilirubin, elevated AST, stable serial troponins, elevated CK, downtrending lactic acid, urinalysis with large hemoglobin, negative ammonia, negative viral swab   Imaging Studies ordered:  I ordered imaging studies including CT scan head, cervical spine, axilla facial, chest abdomen and pelvis, x-ray of left shoulder I independently visualized and interpreted imaging which showed no acute intracranial hemorrhage, no cervical spine fracture, no maxillofacial fracture, no acute traumatic injury within the chest abdomen pelvis, no acute osseous injury or lesion to left shoulder I agree with the radiologist interpretation   Cardiac Monitoring: / EKG:  The patient was maintained on a cardiac monitor.  I personally viewed and interpreted the cardiac monitored which showed an underlying rhythm of: Normal sinus rhythm, no ST/T wave changes, no ischemic changes, no STEMI   Consultations Obtained:  I requested consultation with the hospitalist,  and discussed lab and imaging findings as well as pertinent plan - they recommend: Admission   Problem List / ED Course / Critical interventions / Medication management  Patient is doing well at this time and does remain stable.  Discussed with patient and family that we will plan for admission to the hospitalist service given his rhabdomyolysis, dehydration, altered mental status and recent fall.  Patient was initially covered with antibiotics as he did have leukocytosis and a lactic acidosis.  Do suspect that this was most likely secondary to his dehydration and right and the lysis at this point as no other clear source of infection is noted.  All CT scan and x-rays were unremarkable for any signs of acute traumatic process.   Did add on GI panel and C. difficile given the associated diarrhea but patient has had no bowel movements in the emergency department.  Urinalysis was unremarkable any signs of acute urinary tract infection.  Lactic acid is downtrending at this point.  He has been started on IV fluids.  Have discussed patient case with Dr. Willette with the hospitalist service who has accepted for admission. I ordered medication including IV fluids, Rocephin  for vitamin lysis, dehydration, lactic acidosis and leukocytosis Reevaluation of the patient after these medicines showed that the patient improved I have reviewed the patients home medicines and have made adjustments as needed   Social Determinants of Health:  Lives alone   Test / Admission - Considered:  Admission     Final diagnoses:  None    ED Discharge Orders     None          Stephen Stephen JONETTA DEVONNA 09/15/23 1247    Charlyn Sora, MD 09/17/23 [phone]

## 2023-09-15 NOTE — Progress Notes (Signed)
   09/15/23 2204  Assess: MEWS Score  ECG Heart Rate (!) 172  Resp (!) 22  Assess: MEWS Score  MEWS Temp 0  MEWS Systolic 0  MEWS Pulse 3  MEWS RR 1  MEWS LOC 0  MEWS Score 4  MEWS Score Color Red  Assess: if the MEWS score is Yellow or Red  Were vital signs accurate and taken at a resting state? Yes  Does the patient meet 2 or more of the SIRS criteria? No  MEWS guidelines implemented  Yes, red  Treat  MEWS Interventions Considered administering scheduled or prn medications/treatments as ordered  Take Vital Signs  Increase Vital Sign Frequency  Red: Q1hr x2, continue Q4hrs until patient remains green for 12hrs  Escalate  MEWS: Escalate Red: Discuss with charge nurse and notify provider. Consider notifying RRT. If remains red for 2 hours consider need for higher level of care  Notify: Charge Nurse/RN  Name of Charge Nurse/RN Notified Geneticist, molecular  Provider Notification  Provider Name/Title Dr Manfred  Date Provider Notified 09/15/23  Time Provider Notified 2206  Method of Notification Page  Notification Reason Other (Comment) (in and out of SVT up to 179)  Provider response No new orders (advised to monitor effectiveness of metoprolol )  Date of Provider Response 09/15/23  Time of Provider Response 2207  Assess: SIRS CRITERIA  SIRS Temperature  0  SIRS Respirations  1  SIRS Pulse 1  SIRS WBC 0  SIRS Score Sum  2

## 2023-09-15 NOTE — Assessment & Plan Note (Signed)
-   Holding home medication, checking CBG q. ACH, SSI coverage

## 2023-09-15 NOTE — Assessment & Plan Note (Signed)
 Will continue eyedrop regimen including timolol , high-dose vitamin E 

## 2023-09-16 DIAGNOSIS — R9431 Abnormal electrocardiogram [ECG] [EKG]: Secondary | ICD-10-CM

## 2023-09-16 DIAGNOSIS — S0083XA Contusion of other part of head, initial encounter: Secondary | ICD-10-CM | POA: Diagnosis present

## 2023-09-16 DIAGNOSIS — K7200 Acute and subacute hepatic failure without coma: Secondary | ICD-10-CM | POA: Diagnosis not present

## 2023-09-16 DIAGNOSIS — J9600 Acute respiratory failure, unspecified whether with hypoxia or hypercapnia: Secondary | ICD-10-CM | POA: Diagnosis not present

## 2023-09-16 DIAGNOSIS — Z7982 Long term (current) use of aspirin: Secondary | ICD-10-CM | POA: Diagnosis not present

## 2023-09-16 DIAGNOSIS — E782 Mixed hyperlipidemia: Secondary | ICD-10-CM | POA: Diagnosis present

## 2023-09-16 DIAGNOSIS — H409 Unspecified glaucoma: Secondary | ICD-10-CM | POA: Diagnosis present

## 2023-09-16 DIAGNOSIS — K5909 Other constipation: Secondary | ICD-10-CM | POA: Diagnosis present

## 2023-09-16 DIAGNOSIS — E872 Acidosis, unspecified: Secondary | ICD-10-CM | POA: Diagnosis present

## 2023-09-16 DIAGNOSIS — Y92009 Unspecified place in unspecified non-institutional (private) residence as the place of occurrence of the external cause: Secondary | ICD-10-CM | POA: Diagnosis not present

## 2023-09-16 DIAGNOSIS — Z1152 Encounter for screening for COVID-19: Secondary | ICD-10-CM | POA: Diagnosis not present

## 2023-09-16 DIAGNOSIS — A419 Sepsis, unspecified organism: Secondary | ICD-10-CM | POA: Diagnosis not present

## 2023-09-16 DIAGNOSIS — B957 Other staphylococcus as the cause of diseases classified elsewhere: Secondary | ICD-10-CM | POA: Diagnosis present

## 2023-09-16 DIAGNOSIS — N400 Enlarged prostate without lower urinary tract symptoms: Secondary | ICD-10-CM | POA: Diagnosis present

## 2023-09-16 DIAGNOSIS — Z8249 Family history of ischemic heart disease and other diseases of the circulatory system: Secondary | ICD-10-CM | POA: Diagnosis not present

## 2023-09-16 DIAGNOSIS — E860 Dehydration: Secondary | ICD-10-CM | POA: Diagnosis present

## 2023-09-16 DIAGNOSIS — F419 Anxiety disorder, unspecified: Secondary | ICD-10-CM | POA: Diagnosis present

## 2023-09-16 DIAGNOSIS — T796XXA Traumatic ischemia of muscle, initial encounter: Secondary | ICD-10-CM | POA: Diagnosis present

## 2023-09-16 DIAGNOSIS — I6529 Occlusion and stenosis of unspecified carotid artery: Secondary | ICD-10-CM | POA: Diagnosis present

## 2023-09-16 DIAGNOSIS — R6521 Severe sepsis with septic shock: Secondary | ICD-10-CM | POA: Diagnosis not present

## 2023-09-16 DIAGNOSIS — R6510 Systemic inflammatory response syndrome (SIRS) of non-infectious origin without acute organ dysfunction: Secondary | ICD-10-CM | POA: Diagnosis present

## 2023-09-16 DIAGNOSIS — G9341 Metabolic encephalopathy: Secondary | ICD-10-CM

## 2023-09-16 DIAGNOSIS — N179 Acute kidney failure, unspecified: Secondary | ICD-10-CM | POA: Diagnosis present

## 2023-09-16 DIAGNOSIS — E1122 Type 2 diabetes mellitus with diabetic chronic kidney disease: Secondary | ICD-10-CM | POA: Diagnosis present

## 2023-09-16 DIAGNOSIS — R7989 Other specified abnormal findings of blood chemistry: Secondary | ICD-10-CM | POA: Diagnosis not present

## 2023-09-16 DIAGNOSIS — W19XXXA Unspecified fall, initial encounter: Secondary | ICD-10-CM | POA: Diagnosis present

## 2023-09-16 DIAGNOSIS — N182 Chronic kidney disease, stage 2 (mild): Secondary | ICD-10-CM | POA: Diagnosis present

## 2023-09-16 DIAGNOSIS — Z66 Do not resuscitate: Secondary | ICD-10-CM | POA: Diagnosis present

## 2023-09-16 DIAGNOSIS — R7881 Bacteremia: Secondary | ICD-10-CM | POA: Diagnosis present

## 2023-09-16 DIAGNOSIS — N200 Calculus of kidney: Secondary | ICD-10-CM | POA: Diagnosis present

## 2023-09-16 DIAGNOSIS — R6520 Severe sepsis without septic shock: Secondary | ICD-10-CM | POA: Diagnosis not present

## 2023-09-16 DIAGNOSIS — Z8673 Personal history of transient ischemic attack (TIA), and cerebral infarction without residual deficits: Secondary | ICD-10-CM | POA: Diagnosis not present

## 2023-09-16 DIAGNOSIS — M6282 Rhabdomyolysis: Secondary | ICD-10-CM | POA: Diagnosis present

## 2023-09-16 DIAGNOSIS — R7401 Elevation of levels of liver transaminase levels: Secondary | ICD-10-CM | POA: Diagnosis not present

## 2023-09-16 DIAGNOSIS — Z833 Family history of diabetes mellitus: Secondary | ICD-10-CM | POA: Diagnosis not present

## 2023-09-16 DIAGNOSIS — A403 Sepsis due to Streptococcus pneumoniae: Secondary | ICD-10-CM | POA: Diagnosis not present

## 2023-09-16 DIAGNOSIS — I129 Hypertensive chronic kidney disease with stage 1 through stage 4 chronic kidney disease, or unspecified chronic kidney disease: Secondary | ICD-10-CM | POA: Diagnosis present

## 2023-09-16 LAB — BLOOD CULTURE ID PANEL (REFLEXED) - BCID2

## 2023-09-16 LAB — GLUCOSE, CAPILLARY
Glucose-Capillary: 112 mg/dL — ABNORMAL HIGH (ref 70–99)
Glucose-Capillary: 151 mg/dL — ABNORMAL HIGH (ref 70–99)
Glucose-Capillary: 165 mg/dL — ABNORMAL HIGH (ref 70–99)
Glucose-Capillary: 187 mg/dL — ABNORMAL HIGH (ref 70–99)

## 2023-09-16 LAB — CBC
HCT: 41 % (ref 39.0–52.0)
Hemoglobin: 14.2 g/dL (ref 13.0–17.0)
MCH: 32.7 pg (ref 26.0–34.0)
MCHC: 34.6 g/dL (ref 30.0–36.0)
MCV: 94.5 fL (ref 80.0–100.0)
Platelets: 142 K/uL — ABNORMAL LOW (ref 150–400)
RBC: 4.34 MIL/uL (ref 4.22–5.81)
RDW: 13 % (ref 11.5–15.5)
WBC: 12.6 K/uL — ABNORMAL HIGH (ref 4.0–10.5)
nRBC: 0 % (ref 0.0–0.2)

## 2023-09-16 LAB — COMPREHENSIVE METABOLIC PANEL WITH GFR
ALT: 70 U/L — ABNORMAL HIGH (ref 0–44)
AST: 202 U/L — ABNORMAL HIGH (ref 15–41)
Albumin: 3.2 g/dL — ABNORMAL LOW (ref 3.5–5.0)
Alkaline Phosphatase: 64 U/L (ref 38–126)
Anion gap: 12 (ref 5–15)
BUN: 30 mg/dL — ABNORMAL HIGH (ref 8–23)
CO2: 22 mmol/L (ref 22–32)
Calcium: 8.6 mg/dL — ABNORMAL LOW (ref 8.9–10.3)
Chloride: 101 mmol/L (ref 98–111)
Creatinine, Ser: 1.09 mg/dL (ref 0.61–1.24)
GFR, Estimated: >60 mL/min (ref >60)
Glucose, Bld: 111 mg/dL — ABNORMAL HIGH (ref 70–99)
Potassium: 3.9 mmol/L (ref 3.5–5.1)
Sodium: 135 mmol/L (ref 135–145)
Total Bilirubin: 0.9 mg/dL (ref 0.0–1.2)
Total Protein: 6.3 g/dL — ABNORMAL LOW (ref 6.5–8.1)

## 2023-09-16 LAB — CK TOTAL AND CKMB (NOT AT ARMC)
CK, MB: 21.4 ng/mL — ABNORMAL HIGH (ref 0.5–5.0)
Total CK: 2784 U/L — ABNORMAL HIGH (ref 49–397)

## 2023-09-16 LAB — HEPATITIS PANEL, ACUTE
HCV Ab: NONREACTIVE
Hep A IgM: NONREACTIVE
Hep B C IgM: NONREACTIVE
Hepatitis B Surface Ag: NONREACTIVE

## 2023-09-16 LAB — MRSA NEXT GEN BY PCR, NASAL: MRSA by PCR Next Gen: NOT DETECTED

## 2023-09-16 LAB — ETHANOL: Alcohol, Ethyl (B): <15 mg/dL (ref <15)

## 2023-09-16 MED ORDER — VANCOMYCIN HCL 1500 MG/300ML IV SOLN
1500.0000 mg | Freq: Once | INTRAVENOUS | Status: AC
  Administered 2023-09-16: 1500 mg via INTRAVENOUS
  Filled 2023-09-16: qty 300

## 2023-09-16 MED ORDER — VANCOMYCIN HCL IN DEXTROSE 1-5 GM/200ML-% IV SOLN: 1000.0000 mg | INTRAVENOUS | Status: DC

## 2023-09-16 MED ORDER — LACTATED RINGERS IV SOLN: INTRAVENOUS | Status: AC

## 2023-09-16 MED ORDER — METOPROLOL SUCCINATE ER 50 MG PO TB24
100.0000 mg | ORAL_TABLET | Freq: Every day | ORAL | Status: DC
  Administered 2023-09-16 – 2023-09-21 (×6): 100 mg via ORAL
  Filled 2023-09-16 (×6): qty 2

## 2023-09-16 MED ORDER — METOPROLOL TARTRATE 5 MG/5ML IV SOLN: 5.0000 mg | INTRAVENOUS | Status: DC | PRN

## 2023-09-16 NOTE — Progress Notes (Signed)
 Patient placed on telemetry box number 6. Telemetry tech was called and made aware. CNA stated she would call to be a secondary staff member to verify. Pt is currently running NSR with a HR of 78.

## 2023-09-16 NOTE — Progress Notes (Signed)
 Lab from [facility] called with partial results of blood culture from 1 bottle gram positive Cocci. Staph species. Will call with further results tomorrow.

## 2023-09-16 NOTE — Progress Notes (Signed)
 Verbal report was received from previous RN overseeing patients care in the ICU. This Clinical research associate had no questions for previous nurse regarding report. Pt arrived to this unit and this time and was transferred into room 306. Pt is alert, verbal oriented x3 with confusion. Disoriented to time. Denies pain. IV fluids restarted per order. Able to transfer from w/c to bed with x2 standby assist by staff. Pt is very weak overall. L arm slightly swollen with scattered bruising to bilateral arms. Abrasions to bilateral knees and toes. Healing bruise noted to L facial region under L eye and to chin from previous fall prior to admission. Foam dressing intact to sacrum for protection with skin intact underneath. Skin assessment performed with this Chief Operating Officer. Resting in bed with call light within reach and bed alarm on and active. Fall risk armband placed on pt.

## 2023-09-16 NOTE — Progress Notes (Signed)
 Lab called positive blood cultures. Aerobic bottle showed gram + cocci. Dr. Willette notified. Patient receiving antibiotics.

## 2023-09-16 NOTE — Plan of Care (Signed)
  Problem: Acute Rehab PT Goals(only PT should resolve) Goal: Pt Will Go Supine/Side To Sit Outcome: Progressing Flowsheets (Taken 09/16/2023 1129) Pt will go Supine/Side to Sit: with moderate assist Goal: Patient Will Transfer Sit To/From Stand Outcome: Progressing Flowsheets (Taken 09/16/2023 1129) Patient will transfer sit to/from stand: with moderate assist Goal: Pt Will Transfer Bed To Chair/Chair To Bed Outcome: Progressing Flowsheets (Taken 09/16/2023 1129) Pt will Transfer Bed to Chair/Chair to Bed: with mod assist Goal: Pt Will Ambulate Outcome: Progressing Flowsheets (Taken 09/16/2023 1129) Pt will Ambulate:  10 feet  with moderate assist  with rolling walker

## 2023-09-16 NOTE — Progress Notes (Signed)
 Progress Note  Subjective Patient admitted due to sepsis and acute metabolic encephalopathy with confusion was reported to be having very frequent intermittent elevated heart rate up to 172 bpm which only lasts for a few seconds at a time. EKG personally reviewed was obtained and showed SVT with occasional PVCs at a rate of 158 bpm; of note, EKG on admission showed atrial fibrillation with rate controlled. His evening metoprolol  was given and since symptoms continued, he was moved to stepdown unit. Tylenol  was given due to temperature of 100 F, HR became better controlled Repeated EKG showed normal sinus rhythm with PACs at a rate of 72 bpm.  In the morning: Patient became confused again and refused a.m. labs. On review of admission Labs, AST was noted to be 334, ALT 75.  Alcohol level was ordered and pending.   Physical Exam BP (!) 156/126   Pulse 81   Temp 98.1 F (36.7 C) (Oral)   Resp 18   Ht 5' 8 (1.727 m)   Wt 73.4 kg   SpO2 100%   BMI 24.60 kg/m    Gen:- Awake Alert, but confused and laying in bed comfortably HEENT:- [place].AT, No sclera icterus Neck-Supple Neck,No JVD,.  Lungs-  CTAB.  No wheezing CV- S1, S2 normal Abd-  +ve B.Sounds, Abd Soft, No tenderness,    Extremity/Skin:- No  edema,   warm and dry Psych-affect is appropriate, oriented x3 Neuro-no new focal deficits, no tremors  Assessment and plan Abnormal EKG This was possibly precipitated due to patient's sepsis state Continue telemetry and continue sepsis management  Acute metabolic encephalopathy Continue management as described in H&P  Transaminitis Alcohol level ordered and pending  Please refer to admission H&P for details regarding the care of this patient  Total time:  37 minutes This includes time reviewing the chart including progress notes, labs, EKGs, taking medical decisions, ordering labs and documenting findings.

## 2023-09-16 NOTE — Evaluation (Signed)
 Physical Therapy Evaluation Patient Details Name: Stephen [name] MRN: [id number] DOB: 1935-10-14 Today's Date: 09/16/2023  History of Present Illness  Stephen [name] is a 88 year old male with extensive history of constipation, glaucoma, HTN, HLD, DM2, BPH, CKD, GERD, TIA,... Presented to ED after patient was found down by family at home.  Per family patient was found facedown on the tile floor outside the bathroom covered in liquid stool.  Patient  was found awake alert oriented x 2, unable to get up, and hold his weight on his legs.  Visible facial swelling on the left side and bruising on the right cheek.  Was not confirmed, unknown how long patient was down.  Last well-known was Wednesday.    Clinical Impression  Patient sleeping on therapist arrival.  He rouses to loud greeting and physical touch.  Patient with answers to questions regarding living situation that appear to differ from chart review information.  He requires max A to move, roll in bed and is unable to sit fully upright; need max assist for a partial supine to sit.  He can follow one step commands but drifts off to sleep several times during PT assessment.  patient left in bed with call button in reach, bed alarm set and nursing notified of mobility status. Patient will benefit from continued skilled therapy services during the remainder of his hospital stay and at the next recommended venue of care to address deficits and promote return to optimal function.             If plan is discharge home, recommend the following: A lot of help with bathing/dressing/bathroom;A lot of help with walking and/or transfers   Can travel by private vehicle   No    Equipment Recommendations None recommended by PT  Recommendations for Other Services       Functional Status Assessment Patient has had a recent decline in their functional status and demonstrates the ability to make significant improvements in function in a reasonable and  predictable amount of time.     Precautions / Restrictions Precautions Precautions: Fall Recall of Precautions/Restrictions: Impaired Precaution/Restrictions Comments: patient confused Restrictions Weight Bearing Restrictions Per Provider Order: No      Mobility  Bed Mobility Overal bed mobility: Needs Assistance Bed Mobility: Rolling, Supine to Sit Rolling: Max assist   Supine to sit: Max assist, +2 for physical assistance     General bed mobility comments: patient unable to assist PT with moving in bed or with lifting his trunk for supine to sit    Transfers                   General transfer comment: did not attempt transfer due to max assist for bed mobility    Ambulation/Gait                  Stairs            Wheelchair Mobility     Tilt Bed    Modified Rankin (Stroke Patients Only)       Balance Overall balance assessment: Needs assistance     Sitting balance - Comments: max assist for partially sitting up                                     Pertinent Vitals/Pain Pain Assessment Pain Assessment: Faces Pain Score: 6  Faces Pain Scale: Hurts even more Pain Location:  hands and knees Pain Intervention(s): Monitored during session    Home Living Family/patient expects to be discharged to:: Unsure Living Arrangements: Alone                 Additional Comments: per chart review lives alone although patient states his sister in law lives upstairs    Prior Function Prior Level of Function : Patient poor historian/Family not available;History of Falls (last six months)                     Extremity/Trunk Assessment   Upper Extremity Assessment Upper Extremity Assessment: Defer to OT evaluation    Lower Extremity Assessment Lower Extremity Assessment: Generalized weakness       Communication        Cognition Arousal: Lethargic     PT - Cognitive impairments: No family/caregiver  present to determine baseline                       PT - Cognition Comments: patient falls asleep several times during assessment.  also makes statements that appear to conflict with chart review information Following commands: Impaired       Cueing Cueing Techniques: Verbal cues, Gestural cues     General Comments      Exercises     Assessment/Plan    PT Assessment Patient needs continued PT services  PT Problem List Decreased strength;Decreased activity tolerance;Decreased mobility       PT Treatment Interventions Functional mobility training;Therapeutic activities;Therapeutic exercise;Balance training;Patient/family education    PT Goals (Current goals can be found in the Care Plan section)  Acute Rehab PT Goals Patient Stated Goal: patient unable to verbalize goal    Frequency Min 3X/week     Co-evaluation               AM-PAC PT 6 Clicks Mobility  Outcome Measure Help needed turning from your back to your side while in a flat bed without using bedrails?: Total Help needed moving from lying on your back to sitting on the side of a flat bed without using bedrails?: Total Help needed moving to and from a bed to a chair (including a wheelchair)?: Total Help needed standing up from a chair using your arms (e.g., wheelchair or bedside chair)?: Total Help needed to walk in hospital room?: Total Help needed climbing 3-5 steps with a railing? : Total 6 Click Score: 6    End of Session   Activity Tolerance: Patient limited by lethargy Patient left: in bed;with call bell/phone within reach;with bed alarm set Nurse Communication: Mobility status PT Visit Diagnosis: Repeated falls (R29.6);Muscle weakness (generalized) (M62.81);History of falling (Z91.81);Other abnormalities of gait and mobility (R26.89)    Time: 8984-8964 PT Time Calculation (min) (ACUTE ONLY): 20 min   Charges:   PT Evaluation $PT Eval Moderate Complexity: 1 Mod   PT General  Charges $$ ACUTE PT VISIT: 1 Visit         11:29 AM, 09/16/23 Blanchard [place] Small Khiara Shuping MPT [facility] [place] [phone] Ph:[phone]

## 2023-09-16 NOTE — Progress Notes (Signed)
 Pharmacy Antibiotic Note  Stephen [name] is a 88 y.o. male admitted on 09/15/2023 with altered mental status.  Pharmacy has been consulted for vancomycin  dosing.  Patient with tmax of 100 and wbc elevated slightly at 12.6. Patient with GPC in blood culture.   Vancomycin  1g IV Q 24 hrs. Goal AUC 400-550. Expected AUC: 512 SCr used: 1.3   Height: 5' 8 (172.7 cm) Weight: 73.4 kg (161 lb 13.1 oz) IBW/kg (Calculated) : 68.4  Temp (24hrs), Avg:98.8 F (37.1 C), Min:98.1 F (36.7 C), Max:100 F (37.8 C)  Recent Labs  Lab 09/15/23 0905 09/15/23 1151 09/16/23 0912  WBC 19.8*  --  12.6*  CREATININE 1.31*  --   --   LATICACIDVEN 3.0* 2.3*  --     Estimated Creatinine Clearance: 39.2 mL/min (A) (by C-G formula based on SCr of 1.31 mg/dL (H)).    Allergies  Allergen Reactions   Levofloxacin Other (See Comments)    Severe headache   Benicar Hct [Olmesartan Medoxomil-Hctz] Other (See Comments)    Renal damage   Benicar [Olmesartan]     Other reaction(s): Not available   Celecoxib Other (See Comments)    confusion Other reaction(s): Not available   Clarithromycin      Other reaction(s): Not available   Combigan [Brimonidine Tartrate-Timolol ] Other (See Comments)    Eye irritation    Crestor  [Rosuvastatin ]     Kidney problems   Dorzolamide     Allergic reaction   Doxycycline     Other reaction(s): Not available   Hydralazine Other (See Comments)    Unknown  Other reaction(s): Not available   Latanoprost     Allergic reaction   Levofloxacin     Other reaction(s): Not available   Metronidazole Other (See Comments)    Headache Other reaction(s): Not available   Plavix  [Clopidogrel ]     Caused confusion and constipation   Propoxyphene Other (See Comments)    With Acetaminophen , severe headache and trouble urinating Other reaction(s): Not available   Tetracyclines & Related     Severe headache   Clarithromycin  Rash    Bad Headache   Thank you for allowing pharmacy  to be a part of this patient's care.  Dempsey Blush PharmD., BCPS Clinical Pharmacist 09/16/2023 11:43 AM

## 2023-09-16 NOTE — Care Management Obs Status (Signed)
 MEDICARE OBSERVATION STATUS NOTIFICATION   Patient Details  Name: KHIZAR FIORELLA MRN: [id number] Date of Birth: 25-Jul-1935   Medicare Observation Status Notification Given:  Yes    Nena LITTIE Coffee, RN 09/16/2023, 5:43 PM

## 2023-09-16 NOTE — Plan of Care (Signed)
  Problem: Education: Goal: Knowledge of General Education information will improve Description: Including pain rating scale, medication(s)/side effects and non-pharmacologic comfort measures Outcome: Not Progressing   Problem: Health Behavior/Discharge Planning: Goal: Ability to manage health-related needs will improve Outcome: Not Progressing   Problem: Clinical Measurements: Goal: Ability to maintain clinical measurements within normal limits will improve Outcome: Progressing Goal: Will remain free from infection Outcome: Progressing Goal: Diagnostic test results will improve Outcome: Progressing Goal: Respiratory complications will improve Outcome: Progressing Goal: Cardiovascular complication will be avoided Outcome: Progressing   Problem: Activity: Goal: Risk for activity intolerance will decrease Outcome: Progressing   Problem: Nutrition: Goal: Adequate nutrition will be maintained Outcome: Progressing   Problem: Coping: Goal: Level of anxiety will decrease Outcome: Not Progressing   Problem: Elimination: Goal: Will not experience complications related to bowel motility Outcome: Progressing Goal: Will not experience complications related to urinary retention Outcome: Progressing   Problem: Pain Managment: Goal: General experience of comfort will improve and/or be controlled Outcome: Progressing   Problem: Safety: Goal: Ability to remain free from injury will improve Outcome: Progressing   Problem: Skin Integrity: Goal: Risk for impaired skin integrity will decrease Outcome: Progressing   Problem: Education: Goal: Ability to describe self-care measures that may prevent or decrease complications (Diabetes Survival Skills Education) will improve Outcome: Not Progressing Goal: Individualized Educational Video(s) Outcome: Progressing   Problem: Coping: Goal: Ability to adjust to condition or change in health will improve Outcome: Not Progressing    Problem: Fluid Volume: Goal: Ability to maintain a balanced intake and output will improve Outcome: Progressing   Problem: Health Behavior/Discharge Planning: Goal: Ability to identify and utilize available resources and services will improve Outcome: Not Progressing Goal: Ability to manage health-related needs will improve Outcome: Not Progressing   Problem: Metabolic: Goal: Ability to maintain appropriate glucose levels will improve Outcome: Progressing   Problem: Nutritional: Goal: Maintenance of adequate nutrition will improve Outcome: Progressing Goal: Progress toward achieving an optimal weight will improve Outcome: Progressing   Problem: Skin Integrity: Goal: Risk for impaired skin integrity will decrease Outcome: Progressing   Problem: Tissue Perfusion: Goal: Adequacy of tissue perfusion will improve Outcome: Progressing

## 2023-09-16 NOTE — Progress Notes (Addendum)
 PROGRESS NOTE    Patient: Stephen [name]                            PCP: Shona Norleen PEDLAR, MD                    DOB: 10-18-1935            DOA: 09/15/2023 FMW:[id number]             DOS: 09/16/2023, 10:42 AM   LOS: 0 days   Date of Service: The patient was seen and examined on 09/16/2023  Subjective:   The patient was seen and examined this morning. Hemodynamically stable. No issues overnight .  Brief Narrative:   Stephen [name] is a 88 year old male with extensive history of constipation, glaucoma, HTN, HLD, DM2, BPH, CKD, GERD, TIA,... Presented to ED after patient was found down by family at home.  Per family patient was found facedown on the tile floor outside the bathroom covered in liquid stool.  Patient  was found awake alert oriented x 2, unable to get up, and hold his weight on his legs.  Visible facial swelling on the left side and bruising on the right cheek.  Was not confirmed, unknown how long patient was down.  Last well-known was Wednesday.  Patient was seen in the ED and was below chief complaint of constipation and impaction 2 days ago. Apparently at home he took a full bottle of magnesium  citrate. At this point patient has no complaints of pain, per family he was complaining of back pain at home. He is noted for progressive weakness and difficult to ambulate now.   ED evaluation: Blood pressure (!) 148/63, pulse 85, temperature 98.3 F (36.8 C), temperature source Rectal, resp. rate 17, height 5' 8 (1.727 m), weight 69.9 kg, SpO2 93%.  Labs: Glucose 140, BUN 37, creatinine 1.31, anion gap 19, creatinine 2.9, AST 334, ALT 75, total bilirubin 1.7, GFR 53, total CK 7000 422, troponin 113, 19, lactic acid 3, 2.3, WBC 19.8, Respiratory panel including;  RSV, influenza A/B COVID all negative UA large hemoglobin, ketones, negative for bacteria, WBC 0-5, chest x-ray clean CT head, chest, cervical spine, maxillofacial area follow-up reviewed negative for any acute traumatic  findings.  Right renal calculi, the largest 7 mm, but no evidence of passing stone or hydroureteronephrosis. 4. Liquid stool in the colon but no evidence of obstruction or colitis. 5. Lumbar degenerative changes. Potential for at least moderate spinal stenosis at L2-3 and L3-4.    Assessment & Plan:   Principal Problem:   Sepsis (HCC) Active Problems:   Rhabdomyolysis   Lactic acidosis   Elevated troponin   Falls   Acute metabolic encephalopathy   Bacteremia   Essential hypertension   Glaucoma, both eyes   Stage 2 chronic kidney disease   Carotid artery stenosis   Chronic constipation   Transaminitis   Mixed hyperlipidemia   Type 2 diabetes mellitus (HCC)     Assessment and Plan: * Sepsis (HCC) - POA met SIRS criteria, with leukocytosis: WBC 19.8, creatinine 1.31, lactic acid 3.0,  - Ruling out sepsis -No source of infection, UA negative chest x-ray clean Presentation likely due to diarrhea, dehydration, lactic acidosis Remain afebrile, normotensive, -Continue IV fluid resuscitation continue empiric antibiotic with Rocephin   Bacteremia - Blood cultures from admission 09/15/2023 growing gram-positive cocci -Switching IV antibiotics from Rocephin  to vancomycin  -Obtaining repeat blood cultures in a.m. -  Hemodynamically stable  Acute metabolic encephalopathy - Metabolic encephalopathy with confusion,  - Improved mentation today, awake alert oriented x 3 CT head negative, MRI of the brain reviewed in detail-negative for any acute changes chronic microvascular changes - Will continue with neurochecks  -History of TIA, continue aspirin , with holding statins due to transaminitis, rhabdomyolysis  Falls Generalized weakness, status post fall -Severe debility -Per POA needs assist with all ADLs and  - Consulting PT OT for evaluation, consulting TOC for possible placement  - S/p evaluation aside from superficial bruising no signs of traumatic laceration open wound CT  head cervical spine chest abdomen all reviewed, negative for any acute traumatic findings  CT head, chest, cervical spine, maxillofacial area follow-up reviewed negative for any acute traumatic findings.  Right renal calculi, the largest 7 mm, but no evidence of passing stone or hydroureteronephrosis. 4. Liquid stool in the colon but no evidence of obstruction or colitis. 5. Lumbar degenerative changes. Potential for at least moderate spinal stenosis at L2-3 and L3-4.  - Fall precautions - MRI of the brain-reviewed, chronic microvascular changes Acute intracranial abnormalities   Elevated troponin Denies any chest pain this morning - Elevated troponin with elevated total CK likely ischemic demand -No acute changes in EKG -Will continue to monitor closely  - Troponin trending down  - As needed nitroglycerin, aspirin , analgesics  Lactic acidosis Lactic acidosis due to sepsis-ruled in - Lactic acid 3.0>> 2.3 WBC 19.8 >> 12.6, -Afebrile hypertensive -Blood cultures are growing gram-positive cocci -Antibiotic switched from Rocephin  to vancomycin  today 09/16/2023  - UA-negative, chest x-ray within normal limits-no source of infection   Rhabdomyolysis Due to poor wound mobility was found down - Total CK elevated to 7,422 - With AKI with a creatinine of 1.31 -S/p aggressive IV fluid resuscitation-continue maintenance IV fluid with LR 150 mL -Will monitor CK and creatinine closely  Transaminitis -Monitoring LFTs - Obtaining hepatitis panel - Avoiding hepatotoxins  Chronic constipation C/o loose stool-diarrhea -No further diarrhea episode overnight - Status post ingestion of possible multiple bottles of mag citrate at home -Withholding bowel regimen -Monitoring closely  Carotid artery stenosis - Denies any chest pain - Continue aspirin , holding statins (due to transaminitis)  Stage 2 chronic kidney disease Acute on chronic stage II CKD - BUN 37, GFR 53 - Continue IV fluid  resuscitation -Avoid nephrotoxin, hypotension  Lab Results  Component Value Date   CREATININE 1.31 (H) 09/15/2023   CREATININE 1.25 (H) 10/06/2019   CREATININE 1.13 01/24/2017      Glaucoma, both eyes Will continue eyedrop regimen including timolol , high-dose vitamin E   Essential hypertension - Hypertensive this a.m. - Resuming home medication of Norvasc  and metoprolol  -Along with as needed hydralazine  Type 2 diabetes mellitus (HCC) - Holding home medication, checking CBG q. ACH, SSI coverage  Mixed hyperlipidemia - Holding home medication of Crestor  due to transaminitis, and rhabdo myelitis      ----------------------------------------------------------------------------------------------------------------------------------- Nutritional status:  The patient's BMI is: Body mass index is 24.6 kg/m. I agree with the assessment and plan as outlined ---------------------------------------------------------------------------------------------------------------------------------------- Cultures; Blood Cultures x 2 >> gram-positive cocci Urine Culture  >>>   IV antibiotics Rocephin -D/Ced 09/16/2023,  8/28 Vancomycin  -    ----------------------------------------------------------------------------------------------------------------------------------------- DVT prophylaxis:  heparin  injection 5,000 Units Start: 09/15/23 1400 SCDs Start: 09/15/23 1220   Code Status:   Code Status: Do not attempt resuscitation (DNR) PRE-ARREST INTERVENTIONS DESIRED  Family Communication: No family member present at bedside-  -Advance care planning has been discussed.   Admission status:  Status is: Observation The patient remains OBS appropriate and will d/c before 2 midnights.   Disposition: From  - home             Planning for discharge in 1-2 days   Procedures:   No admission procedures for hospital encounter.   Antimicrobials:  Anti-infectives (From admission, onward)     Start     Dose/Rate Route Frequency Ordered Stop   09/15/23 1300  cefTRIAXone  (ROCEPHIN ) 2 g in sodium chloride  0.9 % 100 mL IVPB  Status:  Discontinued        2 g 200 mL/hr over 30 Minutes Intravenous Every 24 hours 09/15/23 1251 09/16/23 1034   09/15/23 1030  cefTRIAXone  (ROCEPHIN ) 2 g in sodium chloride  0.9 % 100 mL IVPB        2 g 200 mL/hr over 30 Minutes Intravenous  Once 09/15/23 1019 09/15/23 1110        Medication:   amLODipine   10 mg Oral QPM   ascorbic acid   500 mg Oral Daily   aspirin   325 mg Oral Daily   Chlorhexidine  Gluconate Cloth  6 each Topical Daily   heparin   5,000 Units Subcutaneous Q8H   insulin  aspart  0-6 Units Subcutaneous TID WC   lactobacillus  1 g Oral TID WC   Latanoprostene Bunod   1 drop Both Eyes QHS   metoprolol  succinate  100 mg Oral Daily   multivitamin with minerals  1 tablet Oral Daily   niacin   1,000 mg Oral QHS   sodium chloride  flush  3 mL Intravenous Q12H   sodium chloride  flush  3 mL Intravenous Q12H   timolol   1 drop Both Eyes BID   vitamin E   400 Units Oral Daily    acetaminophen  **OR** acetaminophen , bisacodyl , HYDROmorphone  (DILAUDID ) injection, ipratropium, levalbuterol , metoprolol  tartrate, ondansetron  **OR** ondansetron  (ZOFRAN ) IV, oxyCODONE , senna-docusate, sodium phosphate, traZODone    Objective:   Vitals:   09/16/23 0400 09/16/23 0447 09/16/23 0500 09/16/23 0759  BP: (!) 155/84  (!) 156/126   Pulse: 70  81   Resp: 19  18   Temp:  98.1 F (36.7 C)  98.3 F (36.8 C)  TempSrc:  Oral  Axillary  SpO2: 99%  100%   Weight:      Height:        Intake/Output Summary (Last 24 hours) at 09/16/2023 1042 Last data filed at 09/16/2023 0447 Gross per 24 hour  Intake 220 ml  Output 650 ml  Net -430 ml   Filed Weights   09/15/23 0907 09/16/23 0000  Weight: 69.9 kg 73.4 kg     Physical examination:   General:  AAO x 3,  cooperative, no distress;   HEENT:  Normocephalic, PERRL, otherwise with in Normal limits    Neuro:  CNII-XII intact. , normal motor and sensation, reflexes intact   Lungs:   Clear to auscultation BL, Respirations unlabored,  No wheezes / crackles  Cardio:    S1/S2, RRR, No murmure, No Rubs or Gallops   Abdomen:  Soft, non-tender, bowel sounds active all four quadrants, no guarding or peritoneal signs.  Muscular  skeletal:  Limited exam -global generalized weaknesses - in bed, able to move all 4 extremities,   2+ pulses,  symmetric, No pitting edema  Skin:  Dry, warm to touch, negative for any Rashes,  Wounds: Please see nursing documentation       ------------------------------------------------------------------------------------------------------------------------------------------    LABs:     Latest Ref Rng & Units 09/16/2023  9:12 AM 09/15/2023    9:05 AM 08/20/2020   10:52 AM  CBC  WBC 4.0 - 10.5 K/uL 12.6  19.8  8.6   Hemoglobin 13.0 - 17.0 g/dL 85.7  83.7  84.4   Hematocrit 39.0 - 52.0 % 41.0  46.3  46.0   Platelets 150 - 400 K/uL 142  175  171.0       Latest Ref Rng & Units 09/15/2023    9:05 AM 10/06/2019   11:09 AM 01/24/2017    6:49 PM  CMP  Glucose 70 - 99 mg/dL 859  793  867   BUN 8 - 23 mg/dL 37  25  22   Creatinine 0.61 - 1.24 mg/dL 8.68  8.74  8.86   Sodium 135 - 145 mmol/L 139  135  137   Potassium 3.5 - 5.1 mmol/L 4.5  4.2  4.4   Chloride 98 - 111 mmol/L 99  99  102   CO2 22 - 32 mmol/L 21  27  24    Calcium  8.9 - 10.3 mg/dL 9.3  9.1  9.0   Total Protein 6.5 - 8.1 g/dL 7.3  7.3    Total Bilirubin 0.0 - 1.2 mg/dL 1.7  0.4    Alkaline Phos 38 - 126 U/L 81  72    AST 15 - 41 U/L 334  30    ALT 0 - 44 U/L 75  31         Micro Results Recent Results (from the past 240 hours)  Culture, blood (routine x 2)     Status: None (Preliminary result)   Collection Time: 09/15/23  9:05 AM   Specimen: BLOOD  Result Value Ref Range Status   Specimen Description BLOOD LEFT ARM  Final   Special Requests   Final    BOTTLES DRAWN AEROBIC AND ANAEROBIC  Blood Culture adequate volume   Culture  Setup Time   Final    GRAM POSITIVE COCCI AEROBIC BOTTLE ONLY Gram Stain Report Called to,Read Back By and Verified With: ASHLEY S. ON 09/16/2023 @10 :28 BY T. HAMER  Performed at [facility], [address], [place], [place] [address]    Culture GRAM POSITIVE COCCI  Final   Report Status PENDING  Incomplete  Resp panel by RT-PCR (RSV, Flu A&B, Covid) Anterior Nasal Swab     Status: None   Collection Time: 09/15/23 11:17 AM   Specimen: Anterior Nasal Swab  Result Value Ref Range Status   SARS Coronavirus 2 by RT PCR NEGATIVE NEGATIVE Final    Comment: (NOTE) SARS-CoV-2 target nucleic acids are NOT DETECTED.  The SARS-CoV-2 RNA is generally detectable in upper respiratory specimens during the acute phase of infection. The lowest concentration of SARS-CoV-2 viral copies this assay can detect is 138 copies/mL. A negative result does not preclude SARS-Cov-2 infection and should not be used as the sole basis for treatment or other patient management decisions. A negative result may occur with  improper specimen collection/handling, submission of specimen other than nasopharyngeal swab, presence of viral mutation(s) within the areas targeted by this assay, and inadequate number of viral copies(<138 copies/mL). A negative result must be combined with clinical observations, patient history, and epidemiological information. The expected result is Negative.  Fact Sheet for Patients:  BloggerCourse.com  Fact Sheet for Healthcare Providers:  SeriousBroker.it  This test is no t yet approved or cleared by the [place]  FDA and  has been authorized for detection and/or diagnosis of SARS-CoV-2 by FDA under an  Emergency Use Authorization (EUA). This EUA will remain  in effect (meaning this test can be used) for the duration of the COVID-19 declaration under Section 564(b)(1) of the Act,  21 U.S.C.section 360bbb-3(b)(1), unless the authorization is terminated  or revoked sooner.       Influenza A by PCR NEGATIVE NEGATIVE Final   Influenza B by PCR NEGATIVE NEGATIVE Final    Comment: (NOTE) The Xpert Xpress SARS-CoV-2/FLU/RSV plus assay is intended as an aid in the diagnosis of influenza from Nasopharyngeal swab specimens and should not be used as a sole basis for treatment. Nasal washings and aspirates are unacceptable for Xpert Xpress SARS-CoV-2/FLU/RSV testing.  Fact Sheet for Patients: BloggerCourse.com  Fact Sheet for Healthcare Providers: SeriousBroker.it  This test is not yet approved or cleared by the [place]  FDA and has been authorized for detection and/or diagnosis of SARS-CoV-2 by FDA under an Emergency Use Authorization (EUA). This EUA will remain in effect (meaning this test can be used) for the duration of the COVID-19 declaration under Section 564(b)(1) of the Act, 21 U.S.C. section 360bbb-3(b)(1), unless the authorization is terminated or revoked.     Resp Syncytial Virus by PCR NEGATIVE NEGATIVE Final    Comment: (NOTE) Fact Sheet for Patients: BloggerCourse.com  Fact Sheet for Healthcare Providers: SeriousBroker.it  This test is not yet approved or cleared by the [place]  FDA and has been authorized for detection and/or diagnosis of SARS-CoV-2 by FDA under an Emergency Use Authorization (EUA). This EUA will remain in effect (meaning this test can be used) for the duration of the COVID-19 declaration under Section 564(b)(1) of the Act, 21 U.S.C. section 360bbb-3(b)(1), unless the authorization is terminated or revoked.  Performed at [facility], [address] [place], [place] [address]   MRSA Next Gen by PCR, Nasal     Status: None   Collection Time: 09/16/23 12:00 AM   Specimen: Nasal Mucosa; Nasal Swab  Result Value Ref  Range Status   MRSA by PCR Next Gen NOT DETECTED NOT DETECTED Final    Comment: (NOTE) The GeneXpert MRSA Assay (FDA approved for NASAL specimens only), is one component of a comprehensive MRSA colonization surveillance program. It is not intended to diagnose MRSA infection nor to guide or monitor treatment for MRSA infections. Test performance is not FDA approved in patients less than 28 years old. Performed at [facility], [address], [place], [place] [address]     Radiology Reports MR BRAIN WO CONTRAST Result Date: 09/16/2023 CLINICAL DATA:  88 year old male with altered mental status. Found down. EXAM: MRI HEAD WITHOUT CONTRAST TECHNIQUE: Multiplanar, multiecho pulse sequences of the brain and surrounding structures were obtained without intravenous contrast. COMPARISON:  CT head, face, cervical spine the same day reported separately. Brain MRI 11/12/2019. FINDINGS: Brain: Stable cerebral volume from the 2021 MRI. No restricted diffusion to suggest acute infarction. No midline shift, mass effect, evidence of mass lesion, ventriculomegaly, extra-axial collection or acute intracranial hemorrhage. Cervicomedullary junction and pituitary are within normal limits. Patchy and confluent cerebral white matter T2 and FLAIR hyperintensity with pronounced chronic involvement of the right corona radiata, extending to subcortical white matter there (series 16, image 18). Acute corona radiata lacunar infarct was present in in this region on 2021 exam. The regional white matter T2 and FLAIR hyperintensity has progressed since that time. No cortical encephalomalacia identified. No convincing chronic cerebral blood products on SWI. Outside of the right corona radiata white matter signal changes are stable since 2021 and mild for  age. Mild for age T2 heterogeneity in the bilateral basal ganglia. Thalami, brainstem, cerebellum appear negative. Vascular: Major intracranial vascular flow voids are preserved, stable  since 2021. Skull and upper cervical spine: Visualized bone marrow signal is within normal limits. Partially visible chronic cervical spine degeneration. Sinuses/Orbits: Stable since 2021, negative. Other: Mild chronic right mastoid effusion. Otherwise grossly normal visible internal auditory structures. Negative visible scalp and face. IMPRESSION: 1. No acute intracranial abnormality. 2. Pronounced chronic small-vessel ischemia in the right corona radiata and regional subcortical white matter. Progression there since a 2021 MRI. But mild for age small-vessel disease changes otherwise. Electronically Signed   By: VEAR Hurst M.D.   On: 09/16/2023 06:44   DG Shoulder Left Result Date: 09/15/2023 CLINICAL DATA:  Fall EXAM: LEFT SHOULDER - 2+ VIEW COMPARISON:  None available FINDINGS: Irregularity of the greater tuberosity indicative of rotator cuff tendinopathy. Mild degenerative changes of the glenohumeral joint. No fracture or dislocation. IMPRESSION: No acute abnormality of the LEFT shoulder. Electronically Signed   By: Aliene Lloyd M.D.   On: 09/15/2023 12:35   CT Head Wo Contrast Result Date: 09/15/2023 CLINICAL DATA:  Found on the floor.  Altered mental status. EXAM: CT HEAD WITHOUT CONTRAST CT MAXILLOFACIAL WITHOUT CONTRAST CT CERVICAL SPINE WITHOUT CONTRAST TECHNIQUE: Multidetector CT imaging of the head, cervical spine, and maxillofacial structures were performed using the standard protocol without intravenous contrast. Multiplanar CT image reconstructions of the cervical spine and maxillofacial structures were also generated. RADIATION DOSE REDUCTION: This exam was performed according to the departmental dose-optimization program which includes automated exposure control, adjustment of the mA and/or kV according to patient size and/or use of iterative reconstruction technique. COMPARISON:  11/12/2019 FINDINGS: CT HEAD FINDINGS Brain: Age related volume loss. Chronic small-vessel ischemic changes of the  white matter. No sign of acute infarction, mass lesion, hemorrhage, hydrocephalus or extra-axial collection. Vascular: There is atherosclerotic calcification of the major vessels at the base of the brain. Skull: Negative Other: None CT MAXILLOFACIAL FINDINGS Osseous: No facial fracture. Orbits: No intraorbital injury. Sinuses: Sinuses are clear. Soft tissues: Mild soft tissue swelling of the face. CT CERVICAL SPINE FINDINGS Alignment: No malalignment. Skull base and vertebrae: No regional fracture. Solid bridging anterior osteophytes from C5 through C7. Soft tissues and spinal canal: No traumatic soft tissue finding. 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 Mild osteoarthritis at the C1-2 articulation. C2-3: Facet osteoarthritis on the right. Moderate right foraminal stenosis. C3-4: Endplate and uncovertebral osteophytes more prominent on the left. Bilateral facet degeneration worse on the left. Left foraminal stenosis that could cause neural compression. C4-5: Endplate and uncovertebral osteophytes worse on the right. Facet degeneration worse on the right. Right foraminal stenosis that could cause neural compression. C5 through C7: Fusion of the vertebral bodies. No compressive canal stenosis. Mild bilateral chronic bony foraminal narrowing. C7-T1: Anterior osteophytes but without complete solid union. No stenosis of the canal or foramina. Upper chest: Negative Other: IMPRESSION: HEAD CT: No acute or traumatic finding. Age related volume loss and chronic small-vessel ischemic changes of the white matter. MAXILLOFACIAL CT: No facial fracture. Mild soft tissue swelling of the face. CERVICAL SPINE CT: No acute or traumatic finding. Chronic degenerative changes as outlined above. Electronically Signed   By: Oneil Officer M.D.   On: 09/15/2023 11:25   CT Cervical Spine Wo Contrast Result Date: 09/15/2023 CLINICAL DATA:  Found on the floor.  Altered mental  status. EXAM: CT HEAD  WITHOUT CONTRAST CT MAXILLOFACIAL WITHOUT CONTRAST CT CERVICAL SPINE WITHOUT CONTRAST TECHNIQUE: Multidetector CT imaging of the head, cervical spine, and maxillofacial structures were performed using the standard protocol without intravenous contrast. Multiplanar CT image reconstructions of the cervical spine and maxillofacial structures were also generated. RADIATION DOSE REDUCTION: This exam was performed according to the departmental dose-optimization program which includes automated exposure control, adjustment of the mA and/or kV according to patient size and/or use of iterative reconstruction technique. COMPARISON:  11/12/2019 FINDINGS: CT HEAD FINDINGS Brain: Age related volume loss. Chronic small-vessel ischemic changes of the white matter. No sign of acute infarction, mass lesion, hemorrhage, hydrocephalus or extra-axial collection. Vascular: There is atherosclerotic calcification of the major vessels at the base of the brain. Skull: Negative Other: None CT MAXILLOFACIAL FINDINGS Osseous: No facial fracture. Orbits: No intraorbital injury. Sinuses: Sinuses are clear. Soft tissues: Mild soft tissue swelling of the face. CT CERVICAL SPINE FINDINGS Alignment: No malalignment. Skull base and vertebrae: No regional fracture. Solid bridging anterior osteophytes from C5 through C7. Soft tissues and spinal canal: No traumatic soft tissue finding. 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 Mild osteoarthritis at the C1-2 articulation. C2-3: Facet osteoarthritis on the right. Moderate right foraminal stenosis. C3-4: Endplate and uncovertebral osteophytes more prominent on the left. Bilateral facet degeneration worse on the left. Left foraminal stenosis that could cause neural compression. C4-5: Endplate and uncovertebral osteophytes worse on the right. Facet degeneration worse on the right. Right foraminal stenosis that could cause neural  compression. C5 through C7: Fusion of the vertebral bodies. No compressive canal stenosis. Mild bilateral chronic bony foraminal narrowing. C7-T1: Anterior osteophytes but without complete solid union. No stenosis of the canal or foramina. Upper chest: Negative Other: IMPRESSION: HEAD CT: No acute or traumatic finding. Age related volume loss and chronic small-vessel ischemic changes of the white matter. MAXILLOFACIAL CT: No facial fracture. Mild soft tissue swelling of the face. CERVICAL SPINE CT: No acute or traumatic finding. Chronic degenerative changes as outlined above. Electronically Signed   By: Oneil Officer M.D.   On: 09/15/2023 11:25   CT Maxillofacial WO CM Result Date: 09/15/2023 CLINICAL DATA:  Found on the floor.  Altered mental status. EXAM: CT HEAD WITHOUT CONTRAST CT MAXILLOFACIAL WITHOUT CONTRAST CT CERVICAL SPINE WITHOUT CONTRAST TECHNIQUE: Multidetector CT imaging of the head, cervical spine, and maxillofacial structures were performed using the standard protocol without intravenous contrast. Multiplanar CT image reconstructions of the cervical spine and maxillofacial structures were also generated. RADIATION DOSE REDUCTION: This exam was performed according to the departmental dose-optimization program which includes automated exposure control, adjustment of the mA and/or kV according to patient size and/or use of iterative reconstruction technique. COMPARISON:  11/12/2019 FINDINGS: CT HEAD FINDINGS Brain: Age related volume loss. Chronic small-vessel ischemic changes of the white matter. No sign of acute infarction, mass lesion, hemorrhage, hydrocephalus or extra-axial collection. Vascular: There is atherosclerotic calcification of the major vessels at the base of the brain. Skull: Negative Other: None CT MAXILLOFACIAL FINDINGS Osseous: No facial fracture. Orbits: No intraorbital injury. Sinuses: Sinuses are clear. Soft tissues: Mild soft tissue swelling of the face. CT CERVICAL SPINE  FINDINGS Alignment: No malalignment. Skull base and vertebrae: No regional fracture. Solid bridging anterior osteophytes from C5 through C7. Soft tissues and spinal canal: No traumatic soft tissue finding. 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 Mild osteoarthritis at the C1-2  articulation. C2-3: Facet osteoarthritis on the right. Moderate right foraminal stenosis. C3-4: Endplate and uncovertebral osteophytes more prominent on the left. Bilateral facet degeneration worse on the left. Left foraminal stenosis that could cause neural compression. C4-5: Endplate and uncovertebral osteophytes worse on the right. Facet degeneration worse on the right. Right foraminal stenosis that could cause neural compression. C5 through C7: Fusion of the vertebral bodies. No compressive canal stenosis. Mild bilateral chronic bony foraminal narrowing. C7-T1: Anterior osteophytes but without complete solid union. No stenosis of the canal or foramina. Upper chest: Negative Other: IMPRESSION: HEAD CT: No acute or traumatic finding. Age related volume loss and chronic small-vessel ischemic changes of the white matter. MAXILLOFACIAL CT: No facial fracture. Mild soft tissue swelling of the face. CERVICAL SPINE CT: No acute or traumatic finding. Chronic degenerative changes as outlined above. Electronically Signed   By: Oneil Officer M.D.   On: 09/15/2023 11:25   CT CHEST ABDOMEN PELVIS W CONTRAST Result Date: 09/15/2023 CLINICAL DATA:  Found on the floor. Loss of bowel control. Left-sided facial swelling. EXAM: CT CHEST, ABDOMEN, AND PELVIS WITH CONTRAST TECHNIQUE: Multidetector CT imaging of the chest, abdomen and pelvis was performed following the standard protocol during bolus administration of intravenous contrast. RADIATION DOSE REDUCTION: This exam was performed according to the departmental dose-optimization program which includes automated exposure control, adjustment of  the mA and/or kV according to patient size and/or use of iterative reconstruction technique. CONTRAST:  OMNIPAQUE  IOHEXOL  300 MG/ML  SOLN COMPARISON:  None Available. FINDINGS: CT CHEST FINDINGS Cardiovascular: Normal heart size. No pericardial fluid. Some coronary artery calcification is present. Some aortic atherosclerotic calcification is present. No evidence of aneurysm. Mediastinum/Nodes: No mass or adenopathy. Lungs/Pleura: No pleural disease. No underlying emphysema. No mass. No nodule in need of further follow-up. Benign 2 mm subpleural nodule in the posterior right upper lobe. Musculoskeletal: No regional fracture. Chronic spinal degenerative changes with bridging osteophytes. CT ABDOMEN PELVIS FINDINGS Hepatobiliary: Normal Pancreas: Normal Spleen: Normal Adrenals/Urinary Tract: Adrenal glands are normal. Mild atrophic change of the left kidney without focal finding or obstruction. Right kidney is normal size and contains multiple stones, the largest 7 mm, but no evidence of passing stone or hydroureteronephrosis. Bladder appears normal. Stomach/Bowel: Stomach and small intestine are unremarkable. Normal appendix. Liquid stool in the colon but no evidence of obstruction or colitis. Vascular/Lymphatic: No lymphadenopathy. Aortic atherosclerosis without aneurysm. Reproductive: Normal Other: No free fluid or air. Musculoskeletal: Lumbar degenerative changes. Potential for at least moderate spinal stenosis at L2-3 and L3-4. IMPRESSION: 1. No acute or traumatic finding. 2. Aortic atherosclerosis. Coronary artery calcification. 3. Right renal calculi, the largest 7 mm, but no evidence of passing stone or hydroureteronephrosis. 4. Liquid stool in the colon but no evidence of obstruction or colitis. 5. Lumbar degenerative changes. Potential for at least moderate spinal stenosis at L2-3 and L3-4. Aortic Atherosclerosis (ICD10-I70.0). Electronically Signed   By: Oneil Officer M.D.   On: 09/15/2023 11:20     SIGNED: Adriana DELENA Grams, MD, FHM. FAAFP. Jolynn Pack - [facility] Critical care time spent - 55 min.  In seeing, evaluating and examining the patient. Reviewing medical records, labs, drawn plan of care. [facility],  Pager (please use [email or web address] to page/ text) Please use Epic Secure Chat for non-urgent communication (7AM-7PM)  If 7PM-7AM, please contact night-coverage [email or web address], 09/16/2023, 10:42 AM

## 2023-09-16 NOTE — Assessment & Plan Note (Signed)
-   Blood cultures from admission 09/15/2023 growing gram-positive cocci -Switching IV antibiotics from Rocephin  to vancomycin  -Obtaining repeat blood cultures in a.m. -Hemodynamically stable

## 2023-09-17 DIAGNOSIS — K7200 Acute and subacute hepatic failure without coma: Secondary | ICD-10-CM | POA: Diagnosis not present

## 2023-09-17 DIAGNOSIS — R6521 Severe sepsis with septic shock: Secondary | ICD-10-CM | POA: Diagnosis not present

## 2023-09-17 DIAGNOSIS — R7881 Bacteremia: Secondary | ICD-10-CM | POA: Diagnosis not present

## 2023-09-17 DIAGNOSIS — A403 Sepsis due to Streptococcus pneumoniae: Secondary | ICD-10-CM

## 2023-09-17 LAB — COMPREHENSIVE METABOLIC PANEL WITH GFR
ALT: 68 U/L — ABNORMAL HIGH (ref 0–44)
AST: 151 U/L — ABNORMAL HIGH (ref 15–41)
Albumin: 2.8 g/dL — ABNORMAL LOW (ref 3.5–5.0)
Alkaline Phosphatase: 57 U/L (ref 38–126)
Anion gap: 12 (ref 5–15)
BUN: 29 mg/dL — ABNORMAL HIGH (ref 8–23)
CO2: 24 mmol/L (ref 22–32)
Calcium: 8.6 mg/dL — ABNORMAL LOW (ref 8.9–10.3)
Chloride: 102 mmol/L (ref 98–111)
Creatinine, Ser: 1.12 mg/dL (ref 0.61–1.24)
GFR, Estimated: >60 mL/min (ref >60)
Glucose, Bld: 111 mg/dL — ABNORMAL HIGH (ref 70–99)
Potassium: 4.1 mmol/L (ref 3.5–5.1)
Sodium: 138 mmol/L (ref 135–145)
Total Bilirubin: 0.9 mg/dL (ref 0.0–1.2)
Total Protein: 5.6 g/dL — ABNORMAL LOW (ref 6.5–8.1)

## 2023-09-17 LAB — CBC
HCT: 36.2 % — ABNORMAL LOW (ref 39.0–52.0)
Hemoglobin: 12.7 g/dL — ABNORMAL LOW (ref 13.0–17.0)
MCH: 33.1 pg (ref 26.0–34.0)
MCHC: 35.1 g/dL (ref 30.0–36.0)
MCV: 94.3 fL (ref 80.0–100.0)
Platelets: 138 K/uL — ABNORMAL LOW (ref 150–400)
RBC: 3.84 MIL/uL — ABNORMAL LOW (ref 4.22–5.81)
RDW: 13.1 % (ref 11.5–15.5)
WBC: 10.2 K/uL (ref 4.0–10.5)
nRBC: 0 % (ref 0.0–0.2)

## 2023-09-17 LAB — GLUCOSE, CAPILLARY
Glucose-Capillary: 116 mg/dL — ABNORMAL HIGH (ref 70–99)
Glucose-Capillary: 165 mg/dL — ABNORMAL HIGH (ref 70–99)
Glucose-Capillary: 99 mg/dL (ref 70–99)
Glucose-Capillary: 110 mg/dL — ABNORMAL HIGH (ref 70–99)

## 2023-09-17 LAB — CK TOTAL AND CKMB (NOT AT ARMC)
CK, MB: 9.8 ng/mL — ABNORMAL HIGH (ref 0.5–5.0)
Total CK: 1809 U/L — ABNORMAL HIGH (ref 49–397)

## 2023-09-17 MED ORDER — TRAZODONE HCL 50 MG PO TABS: 100.0000 mg | ORAL_TABLET | Freq: Every day | ORAL | Status: DC

## 2023-09-17 MED ORDER — LACTATED RINGERS IV SOLN: INTRAVENOUS | Status: DC

## 2023-09-17 MED ORDER — VANCOMYCIN HCL 1250 MG/250ML IV SOLN
1250.0000 mg | INTRAVENOUS | Status: DC
  Administered 2023-09-17 – 2023-09-18 (×2): 1250 mg via INTRAVENOUS
  Filled 2023-09-17 (×2): qty 250

## 2023-09-17 MED ORDER — HALOPERIDOL LACTATE 5 MG/ML IJ SOLN
2.0000 mg | Freq: Once | INTRAMUSCULAR | Status: AC
  Administered 2023-09-17: 2 mg via INTRAMUSCULAR
  Filled 2023-09-17: qty 1

## 2023-09-17 MED ORDER — HALOPERIDOL LACTATE 5 MG/ML IJ SOLN
2.0000 mg | Freq: Four times a day (QID) | INTRAMUSCULAR | Status: DC | PRN
  Administered 2023-09-17: 2 mg via INTRAMUSCULAR
  Filled 2023-09-17: qty 1

## 2023-09-17 NOTE — Progress Notes (Signed)
 Pt has become more restless and agitated as the afternoon has progressed. Trying to climb out of bed, pulling at IV lines and removed male wick despite having mittens on. Attempts to reorient pt by both family and staff have been unsuccessful. Pt keeps saying, I gotta get home!, resistive to care, pushing at staff. Pt was administered Haldol  per order for increased agitation and then moved to room 315 at the nurse's station for safety and closer observation. Bed alarm remains on, call bell within reach, floor mats in place.

## 2023-09-17 NOTE — Progress Notes (Signed)
 Patient noted to be very agitated as this is a totally different behavior than when the patient previously arrived to the unit during transfer from ICU. Pt is now confused, and is very agitated and angry. The patient had noted to have removed his Ivs causing bleeding and blood to be all over his clean linens and telemetry box. Pt had removed his telemetry box and taken it apart as well as removed his external male purewick, his gown all of his telemetry leads and was attempting to get OOB by himself and was refusing to allow staff to help him. Pt refused to wear his telemetry or have it placed  back on him and wanted to become argumenative and  more angry when staff attempted to put new telemetry leads on him and replace his tele box. Pt let staff do the bare minimum which was only place a new clean gown on him and put dressings over sites were Ivs were previously at. Pt began trying to kick at staff, close his hands with a balled up fist as if he were going to punch staff and when staff placed safety mittens on the patient he tried to bite and kick staff again. Pt has been screaming out from his room and when staff asks what staff can do to help him, he makes no sense with his answer and only speaks of going home and talks about things of the past as if they are taking place in the present. MD made aware and this writer asked for something for agitation and to help the patient calm down. MD made aware of pts refusal to wear telemetry monitor and asked for orders to d/c due to refusal/noncompliance. MD placed orders for Haldol  IM X1 dose and to d/c Telemetry. Charge nurse in room along with another nurse, cna and this Clinical research associate. Telemetry was removed and telemetry tech made aware. This Clinical research associate administered Haldol  with the assistance of charge nurse and pt did well with receiving injection and did not attempt to refuse or fight staff. Pt did allow this writer to also place a new IV in which has IV fluids infusing at  this time. Pt did not want to continue to wear safety mittens, but due to his behaviors and for his safety, mittens remain in place to bilateral hands. Staff made pt comfortable as well as the environment in efforts to promote sleep/rest. Bed alarm on and call light within reach. Bed rails up for safety.

## 2023-09-17 NOTE — Progress Notes (Signed)
 Haldol  given earlier was helpful but for a very short period of time. Pt will nod off to sleep for a few moments and then wake back up and will periodically scream loudly HELP ME HELP ME HELP ME! Or HELLO! HELLO! When staff has gone into his room, pt has stated that he wants his mittens off and has demanded for them to be removed so he can remove this needle from my arm and go home. On call MD made aware as pt himself has stated I dont care if I wake everyone on the hall up and keep them awake. I'll yell if I want too. Staff cannot close pts door due to safety and pts attempts to get OOB. New orders received for Haldol  IM Q6H PRN agitation. This Clinical research associate is trying every alternate measure for this patient in efforts to avoid medicating him, however, all efforts thus far have failed and if pt continues with these behaviors, Haldol  will be given.

## 2023-09-17 NOTE — Plan of Care (Signed)

## 2023-09-17 NOTE — Plan of Care (Signed)

## 2023-09-17 NOTE — Progress Notes (Signed)
 Patient slept very little during the night. Pt would fall asleep and only sleep for a couple of moments and then he would wake back up and yell out from his room or start mumbling to himself. Pt is very restless and confused. This am, pt is still awake and keep kicking off his blankets and linens as well as managing to remove his gown. Staff has placed his linens back on his bed and put his gown back on multiple times, and as soon as the staff exits the patients room, the patient is already back doing the same thing. Pt also keeps scooting his bottom in the bed and getting himself into a semi-sideways position with his legs partially hanging over the siderails in which all 4 siderails to the bed are up for his safety. Staff has repositioned him in bed multiple times during the night. Denies any pain. Has had intermittent yelling outburst. Pt has not required any furthur Haldol  since the x1 dose was given to him at 0015. A new IV was placed and has been infusing fluids per orders. Pt has been drinking water during the night as well. Oral care provided several times. Bed alarm remains on and active. Call light within reach. Pt does still have on bilateral hand mittens for safety and due to him pulling at his IV lines and due to him managing to successfully pull out x2 Ivs, remove his telemetry box as well as taking it apart, removed his telemetry leads from his skin and pulled off his purewick while urinating all over the bed earlier in the night. Pt stated to this writer Take these ropes off of me so I can pull this needle out of my arm! Pt is unable to be directed due to confusion. Hopefully the patient will rest today.

## 2023-09-17 NOTE — TOC Initial Note (Signed)
 Transition of Care [facility]) - Initial/Assessment Note    Patient Details  Name: Stephen [name] MRN: [id number] Date of Birth: [birth year]  [facility]) CM/SW Contact:    Nena LITTIE Coffee, RN Phone Number: 09/17/2023, 3:24 PM  Clinical Narrative:                 Pt from home alone admitted c/sepsis, rhabdomyolysis. Pt currently c/profound weakness and BUE tremor. Pt is planning on returning home as soon as possible. He understands that he is too weak to be home alone right now. Spoke c/pt and POA regarding SNF placement. PT eval and FL2 completed. Pt has traditional Medicare, admitted 09/15/2023.  Expected Discharge Plan: Skilled Nursing Facility Barriers to Discharge: Continued Medical Work up   Patient Goals and CMS Choice Patient states their goals for this hospitalization and ongoing recovery are:: Strengthening and return home CMS Medicare.gov Compare Post Acute Care list provided to:: Patient Choice offered to / list presented to : Patient [place] ownership interest in [facility].provided to:: Patient    Expected Discharge Plan and Services In-house Referral: Clinical Social Work Discharge Planning Services: CM Consult Post Acute Care Choice: Skilled Nursing Facility Living arrangements for the past 2 months: Single Family Home                                      Prior Living Arrangements/Services Living arrangements for the past 2 months: Single Family Home Lives with:: Self Patient language and need for interpreter reviewed:: Yes Do you feel safe going back to the place where you live?: No   r/t weakness  Need for Family Participation in Patient Care: Yes (Comment) Care giver support system in place?: Yes (comment)   Criminal Activity/Legal Involvement Pertinent to Current Situation/Hospitalization: No - Comment as needed  Activities of Daily Living   ADL Screening (condition at time of admission) Independently performs ADLs?:  No Does the patient have a NEW difficulty with bathing/dressing/toileting/self-feeding that is expected to last >3 days?: Yes (Initiates electronic notice to provider for possible OT consult) Does the patient have a NEW difficulty with getting in/out of bed, walking, or climbing stairs that is expected to last >3 days?: Yes (Initiates electronic notice to provider for possible PT consult) Does the patient have a NEW difficulty with communication that is expected to last >3 days?: Yes (Initiates electronic notice to provider for possible SLP consult) Is the patient deaf or have difficulty hearing?: No Does the patient have difficulty seeing, even when wearing glasses/contacts?: No Does the patient have difficulty concentrating, remembering, or making decisions?: Yes  Permission Sought/Granted Permission sought to share information with : Case Manager, Family Supports, Oceanographer granted to share information with : Yes, Verbal Permission Granted              Emotional Assessment Appearance:: Appears stated age Attitude/Demeanor/Rapport: Engaged Affect (typically observed): Calm Orientation: : Oriented to Self, Oriented to Place Alcohol / Substance Use: Not Applicable Psych Involvement: No (comment)  Admission diagnosis:  Rhabdomyolysis [M62.82] Dehydration [E86.0] Lactic acidosis [E87.20] Elevated troponin [R79.89] AKI (acute kidney injury) (HCC) [N17.9] Contusion of face, initial encounter [S00.83XA] Fall, initial encounter [W19.XXXA] Non-traumatic rhabdomyolysis [M62.82] Altered mental status, unspecified altered mental status type [R41.82] Leukocytosis, unspecified type [D72.829] Patient Active Problem List   Diagnosis Date Noted   Bacteremia 09/16/2023   Rhabdomyolysis 09/15/2023   Lactic acidosis 09/15/2023  Transaminitis 09/15/2023   Elevated troponin 09/15/2023   Falls 09/15/2023   Sepsis (HCC) 09/15/2023   Acute metabolic  encephalopathy 09/15/2023   Chronic constipation 10/27/2021   External hemorrhoids 10/27/2021   Stage 2 chronic kidney disease 01/27/2021   Glaucoma, both eyes 06/04/2020   Abnormal thyroid  function test 06/04/2020   Carotid artery stenosis 06/04/2020   Proteinuria 06/04/2020   Type 2 diabetes mellitus (HCC) 06/04/2020   Speech disturbance 10/06/2019   Carotid artery plaque 07/31/2013   Amaurosis fugax, left eye 07/02/2013   Transient ischemic attack 07/02/2013   Mixed hyperlipidemia 02/13/2009   Essential hypertension 02/13/2009   Transient cerebral ischemia 02/13/2009   NEPHROLITHIASIS 02/13/2009   BENIGN PROSTATIC HYPERTROPHY, HX OF 02/13/2009   History of colon polyps 11/15/2007   PCP:  Shona Norleen PEDLAR, MD Pharmacy:   [facility] [phone] - [place], [place] - [address] [place] [address] Phone: [phone] Fax: [phone]  [facility]) - [place], [place] - [address] [facility] [address] [place] [address] [place] [address] Phone: [phone] Fax: [phone]     Social Drivers of Health (SDOH) Social History: SDOH Screenings   Food Insecurity: Patient Unable To Answer (09/15/2023)  Housing: Unknown (09/15/2023)  Transportation Needs: Patient Unable To Answer (09/15/2023)  Utilities: Patient Unable To Answer (09/15/2023)  Depression (PHQ2-9): Low Risk  (10/29/2021)  Financial Resource Strain: Low Risk  (10/29/2021)  Social Connections: Unknown (09/15/2023)  Stress: No Stress Concern Present (10/29/2021)  Tobacco Use: Low Risk  (11/24/2021)   SDOH Interventions:     Readmission Risk Interventions    09/17/2023    2:56 PM  Readmission Risk Prevention Plan  Transportation Screening Complete  PCP or Specialist Appt within 3-5 Days Complete  HRI or Home Care Consult Complete  Social Work Consult for Recovery Care Planning/Counseling Complete  Palliative Care  Screening Not Applicable  Medication Review Oceanographer) Complete

## 2023-09-17 NOTE — [place] FL2 (Signed)
 [place]  MEDICAID FL2 LEVEL OF CARE FORM     IDENTIFICATION  Patient Name: Stephen [name] Birthdate: July 17, 1935 Sex: male Admission Date (Current Location): 09/15/2023  [facility] and [place] Number:  [employer] and Address:  [facility],  [address] [place] [address]      Provider Number: [id number]  Attending Physician Name and Address:  Willette Adriana DELENA, MD  Relative Name and Phone Number:  Axyl Sitzman [facility], nephew) [phone]; Khanh Tanori [phone]    Current Level of Care: SNF Recommended Level of Care: Skilled Nursing Facility Prior Approval Number:    Date Approved/Denied: 09/17/23 PASRR Number: [id number]  Discharge Plan: SNF    Current Diagnoses: Patient Active Problem List   Diagnosis Date Noted   Bacteremia 09/16/2023   Rhabdomyolysis 09/15/2023   Lactic acidosis 09/15/2023   Transaminitis 09/15/2023   Elevated troponin 09/15/2023   Falls 09/15/2023   Sepsis (HCC) 09/15/2023   Acute metabolic encephalopathy 09/15/2023   Chronic constipation 10/27/2021   External hemorrhoids 10/27/2021   Stage 2 chronic kidney disease 01/27/2021   Glaucoma, both eyes 06/04/2020   Abnormal thyroid  function test 06/04/2020   Carotid artery stenosis 06/04/2020   Proteinuria 06/04/2020   Type 2 diabetes mellitus (HCC) 06/04/2020   Speech disturbance 10/06/2019   Carotid artery plaque 07/31/2013   Amaurosis fugax, left eye 07/02/2013   Transient ischemic attack 07/02/2013   Mixed hyperlipidemia 02/13/2009   Essential hypertension 02/13/2009   Transient cerebral ischemia 02/13/2009   NEPHROLITHIASIS 02/13/2009   BENIGN PROSTATIC HYPERTROPHY, HX OF 02/13/2009   History of colon polyps 11/15/2007    Orientation RESPIRATION BLADDER Height & Weight     Self, Place, Situation  Normal External catheter Weight: 76.2 kg Height:  5' 8 (172.7 cm)  BEHAVIORAL SYMPTOMS/MOOD NEUROLOGICAL BOWEL NUTRITION STATUS      Continent Diet  (see dc summary)  AMBULATORY STATUS COMMUNICATION OF NEEDS Skin   Extensive Assist Verbally Skin abrasions, Bruising                       Personal Care Assistance Level of Assistance  Bathing, Feeding, Dressing Bathing Assistance: Maximum assistance Feeding assistance: Limited assistance Dressing Assistance: Maximum assistance     Functional Limitations Info  Sight, Hearing, Speech Sight Info: Adequate Hearing Info: Impaired Speech Info: Adequate    SPECIAL CARE FACTORS FREQUENCY  PT (By licensed PT), OT (By licensed OT)     PT Frequency: 5x weekly OT Frequency: 5x weekly            Contractures Contractures Info: Not present    Additional Factors Info  Code Status, Allergies Code Status Info: DNR- PRE-ARREST INTERVENTIONS DESIRED Allergies Info: Benicar Hct (Olmesartan Medoxomil-hctz), Benicar (Olmesartan), Clarithromycin , Crestor  (Rosuvastatin ), Doxycycline, Hydralazine, Levofloxacin, Levofloxacin, Plavix  (Clopidogrel ), Propoxyphene, Tetracyclines & Related, Celecoxib, Clarithromycin , Combigan (Brimonidine Tartrate-timolol ), Dorzolamide, Latanoprost, Metronidazole           Current Medications (09/17/2023):  This is the current hospital active medication list Current Facility-Administered Medications  Medication Dose Route Frequency Provider Last Rate Last Admin   acetaminophen  (TYLENOL ) tablet 650 mg  650 mg Oral Q6H PRN Shahmehdi, Seyed A, MD       Or   acetaminophen  (TYLENOL ) suppository 650 mg  650 mg Rectal Q6H PRN Shahmehdi, Seyed A, MD   650 mg at 09/16/23 0028   amLODipine  (NORVASC ) tablet 10 mg  10 mg Oral QPM Shahmehdi, Seyed A, MD   10 mg at 09/16/23 1740   ascorbic  acid (VITAMIN C ) tablet 500 mg  500 mg Oral Daily Shahmehdi, Seyed A, MD   500 mg at 09/17/23 9165   aspirin  tablet 325 mg  325 mg Oral Daily Shahmehdi, Seyed A, MD   325 mg at 09/17/23 9165   bisacodyl  (DULCOLAX) EC tablet 5 mg  5 mg Oral Daily PRN Shahmehdi, Seyed A, MD        Chlorhexidine  Gluconate Cloth 2 % PADS 6 each  6 each Topical Daily Adefeso, Oladapo, DO   6 each at 09/17/23 1022   haloperidol  lactate (HALDOL ) injection 2 mg  2 mg Intramuscular Q6H PRN Adefeso, Oladapo, DO       heparin  injection 5,000 Units  5,000 Units Subcutaneous Q8H Shahmehdi, Seyed A, MD   5,000 Units at 09/17/23 1346   HYDROmorphone  (DILAUDID ) injection 0.5-1 mg  0.5-1 mg Intravenous Q2H PRN Shahmehdi, Adriana LABOR, MD       insulin  aspart (novoLOG ) injection 0-6 Units  0-6 Units Subcutaneous TID WC Shahmehdi, Seyed A, MD   1 Units at 09/17/23 1224   ipratropium (ATROVENT ) nebulizer solution 0.5 mg  0.5 mg Nebulization Q6H PRN Willette Adriana LABOR, MD       lactated ringers  infusion   Intravenous Continuous Willette Adriana LABOR, MD 125 mL/hr at 09/17/23 1426 New Bag at 09/17/23 1426   lactobacillus (FLORANEX/LACTINEX) granules 1 g  1 g Oral TID WC Shahmehdi, Seyed A, MD   1 g at 09/17/23 1227   Latanoprostene Bunod  0.024 % SOLN 1 drop  1 drop Both Eyes QHS Shahmehdi, Seyed A, MD       levalbuterol  (XOPENEX ) nebulizer solution 0.63 mg  0.63 mg Nebulization Q6H PRN Shahmehdi, Seyed A, MD       metoprolol  succinate (TOPROL -XL) 24 hr tablet 100 mg  100 mg Oral Daily Shahmehdi, Seyed A, MD   100 mg at 09/17/23 0834   metoprolol  tartrate (LOPRESSOR ) injection 5 mg  5 mg Intravenous Q5 min PRN Shahmehdi, Seyed A, MD       multivitamin with minerals tablet 1 tablet  1 tablet Oral Daily Shahmehdi, Seyed A, MD   1 tablet at 09/17/23 9165   niacin  (VITAMIN B3) ER tablet 1,000 mg  1,000 mg Oral QHS Shahmehdi, Seyed A, MD   1,000 mg at 09/16/23 2134   ondansetron  (ZOFRAN ) tablet 4 mg  4 mg Oral Q6H PRN Shahmehdi, Adriana LABOR, MD       Or   ondansetron  (ZOFRAN ) injection 4 mg  4 mg Intravenous Q6H PRN Shahmehdi, Seyed A, MD       oxyCODONE  (Oxy IR/ROXICODONE ) immediate release tablet 5 mg  5 mg Oral Q4H PRN Shahmehdi, Seyed A, MD   5 mg at 09/17/23 1518   senna-docusate (Senokot-S) tablet 1 tablet  1 tablet Oral  QHS PRN Shahmehdi, Seyed A, MD       sodium chloride  flush (NS) 0.9 % injection 3 mL  3 mL Intravenous Q12H Shahmehdi, Seyed A, MD   3 mL at 09/17/23 1035   sodium chloride  flush (NS) 0.9 % injection 3 mL  3 mL Intravenous Q12H Shahmehdi, Seyed A, MD   3 mL at 09/17/23 1034   sodium phosphate (FLEET) enema 1 enema  1 enema Rectal Once PRN Shahmehdi, Seyed A, MD       timolol  (TIMOPTIC ) 0.25 % ophthalmic solution 1 drop  1 drop Both Eyes BID Shahmehdi, Seyed A, MD   1 drop at 09/16/23 1613   traZODone  (DESYREL ) tablet 100 mg  100 mg  Oral QHS Shahmehdi, Seyed A, MD       vancomycin  (VANCOREADY) IVPB 1250 mg/250 mL  1,250 mg Intravenous Q24H Tanda Dempsey SAUNDERS, RPH 166.7 mL/hr at 09/17/23 1228 1,250 mg at 09/17/23 1228   vitamin E  capsule 400 Units  400 Units Oral Daily Willette Adriana LABOR, MD   400 Units at 09/17/23 9165     Discharge Medications: Please see discharge summary for a list of discharge medications.  Relevant Imaging Results:  Relevant Lab Results:   Additional Information SSN: [id number]  Nena LITTIE Coffee, RN

## 2023-09-17 NOTE — Progress Notes (Signed)
 PROGRESS NOTE    Patient: Stephen [name]                            PCP: Shona Norleen PEDLAR, MD                    DOB: [birth year]            DOA: 09/15/2023 FMW:[id number]             DOS: 09/17/2023, 11:46 AM   LOS: 1 day   Date of Service: The patient was seen and examined on 09/17/2023  Subjective:   The patient was seen and examined this morning, somnolent. Had a stressful night, with agitation, confusion overnight.  Pulling out lines. Patient was given Haldol , mittens was placed on his hands..  Brief Narrative:   Stephen [name] is a 88 year old male with extensive history of constipation, glaucoma, HTN, HLD, DM2, BPH, CKD, GERD, TIA,... Presented to ED after patient was found down by family at home.  Per family patient was found facedown on the tile floor outside the bathroom covered in liquid stool.  Patient  was found awake alert oriented x 2, unable to get up, and hold his weight on his legs.  Visible facial swelling on the left side and bruising on the right cheek.  Was not confirmed, unknown how long patient was down.  Last well-known was Wednesday.  Patient was seen in the ED and was below chief complaint of constipation and impaction 2 days ago. Apparently at home he took a full bottle of magnesium  citrate. At this point patient has no complaints of pain, per family he was complaining of back pain at home. He is noted for progressive weakness and difficult to ambulate now.   ED evaluation: Blood pressure (!) 148/63, pulse 85, temperature 98.3 F (36.8 C), temperature source Rectal, resp. rate 17, height 5' 8 (1.727 m), weight 69.9 kg, SpO2 93%.  Labs: Glucose 140, BUN 37, creatinine 1.31, anion gap 19, creatinine 2.9, AST 334, ALT 75, total bilirubin 1.7, GFR 53, total CK 7000 422, troponin 113, 19, lactic acid 3, 2.3, WBC 19.8, Respiratory panel including;  RSV, influenza A/B COVID all negative UA large hemoglobin, ketones, negative for bacteria, WBC 0-5, chest x-ray  clean CT head, chest, cervical spine, maxillofacial area follow-up reviewed negative for any acute traumatic findings.  Right renal calculi, the largest 7 mm, but no evidence of passing stone or hydroureteronephrosis. 4. Liquid stool in the colon but no evidence of obstruction or colitis. 5. Lumbar degenerative changes. Potential for at least moderate spinal stenosis at L2-3 and L3-4.    Assessment & Plan:   Principal Problem:   Sepsis (HCC) Active Problems:   Rhabdomyolysis   Lactic acidosis   Elevated troponin   Falls   Acute metabolic encephalopathy   Bacteremia   Essential hypertension   Glaucoma, both eyes   Stage 2 chronic kidney disease   Carotid artery stenosis   Chronic constipation   Transaminitis   Mixed hyperlipidemia   Type 2 diabetes mellitus (HCC)     Assessment and Plan: * Sepsis (HCC)/bacteremia -Sepsis was ruled out  - Hemodynamically stable now, WBC 10.2 - Pulmonary blood cultures growing positive cocci  - POA met SIRS criteria, with leukocytosis: WBC 19.8, creatinine 1.31, lactic acid 3.0,  - UA negative chest x-ray clean Presentation likely due to diarrhea, dehydration, lactic acidosis Remain afebrile, normotensive, -we will Continue IV  fluid resuscitation continue empiric antibiotic -switch to vancomycin   Bacteremia - Blood cultures from admission 09/15/2023 growing gram-positive cocci -Switching IV antibiotics from Rocephin  to >>>vancomycin  -Obtaining repeat blood cultures  -Hemodynamically stable - Will obtain echocardiogram  Acute metabolic encephalopathy - Metabolic encephalopathy with confusion, agitation - Mentation improving, - Initiating as needed Haldol , trazodone  at night  CT head negative, MRI of the brain reviewed in detail-negative for any acute changes chronic microvascular changes - Will continue with neurochecks  -History of TIA, continue aspirin , with holding statins due to transaminitis,  rhabdomyolysis  Falls Generalized weakness, status post fall -Severe debility -Per POA needs assist with all ADLs and  - Consulting PT OT for evaluation, consulting TOC for possible placement  - S/p evaluation aside from superficial bruising no signs of traumatic laceration open wound CT head cervical spine chest abdomen all reviewed, negative for any acute traumatic findings  CT head, chest, cervical spine, maxillofacial area follow-up reviewed negative for any acute traumatic findings.  Right renal calculi, the largest 7 mm, but no evidence of passing stone or hydroureteronephrosis. 4. Liquid stool in the colon but no evidence of obstruction or colitis. 5. Lumbar degenerative changes. Potential for at least moderate spinal stenosis at L2-3 and L3-4.  - Fall precautions - MRI of the brain-reviewed, chronic microvascular changes Acute intracranial abnormalities   Elevated troponin Denies any chest pain this morning - Elevated troponin with elevated total CK likely ischemic demand -No acute changes in EKG -Will continue to monitor closely  - Troponin trending down  - As needed nitroglycerin, aspirin , analgesics  Lactic acidosis Lactic acidosis due to sepsis-ruled in - Lactic acid 3.0>> 2.3 -  WBC 19.8 >> 12.6, 10.2  -Afebrile hypertensive -Blood cultures are growing gram-positive cocci -Antibiotic switched from Rocephin  to vancomycin  today 09/16/2023  - UA-negative, chest x-ray within normal limits-no source of infection   Rhabdomyolysis Due to poor wound mobility was found down - Total CK elevated to 7,422>>> 1809  - With AKI with a creatinine of 1.31 -S/p aggressive IV fluid resuscitation-continue maintenance IV fluid with LR 150 mL -Will monitor CK and creatinine closely  Transaminitis -Monitoring LFTs - Hepatitis panel-nonreactive - Avoiding hepatotoxins  Chronic constipation C/o loose stool-diarrhea -No further diarrhea episode overnight - Status post  ingestion of possible multiple bottles of mag citrate at home -Withholding bowel regimen -Monitoring closely  Carotid artery stenosis - Denies any chest pain - Continue aspirin , holding statins (due to transaminitis)  Stage 2 chronic kidney disease Acute on chronic stage II CKD-improving creatinine - POA: BUN 37, GFR 53 - Continue IV fluid resuscitation -Avoid nephrotoxin, hypotension Lab Results  Component Value Date   CREATININE 1.12 09/17/2023   CREATININE 1.09 09/16/2023   CREATININE 1.31 (H) 09/15/2023     Glaucoma, both eyes Will continue eyedrop regimen including timolol , high-dose vitamin E   Essential hypertension - Hypertensive this a.m. - Resuming home medication of Norvasc  and metoprolol  -Along with as needed hydralazine  Type 2 diabetes mellitus (HCC) - Holding home medication, checking CBG q. ACH, SSI coverage  Mixed hyperlipidemia - Holding home medication of Crestor  due to transaminitis, and rhabdo myelitis      ----------------------------------------------------------------------------------------------------------------------------------- Nutritional status:  The patient's BMI is: Body mass index is 25.54 kg/m. I agree with the assessment and plan as outlined ---------------------------------------------------------------------------------------------------------------------------------------- Cultures; Blood Cultures x 2 >> gram-positive cocci Urine Culture  >>>   IV antibiotics Rocephin -D/Ced 09/16/2023,  8/28 Vancomycin  -    ----------------------------------------------------------------------------------------------------------------------------------------- DVT prophylaxis:  heparin  injection 5,000 Units Start:  09/15/23 1400 SCDs Start: 09/15/23 1220   Code Status:   Code Status: Do not attempt resuscitation (DNR) PRE-ARREST INTERVENTIONS DESIRED  Family Communication: No family member present at bedside-  -Advance care planning has  been discussed.   Admission status:   Status is: Observation The patient remains OBS appropriate and will d/c before 2 midnights.   Disposition: From  - home             Planning for discharge in 1-2 days   Procedures:   No admission procedures for hospital encounter.   Antimicrobials:  Anti-infectives (From admission, onward)    Start     Dose/Rate Route Frequency Ordered Stop   09/17/23 1400  vancomycin  (VANCOCIN ) IVPB 1000 mg/200 mL premix  Status:  Discontinued        1,000 mg 200 mL/hr over 60 Minutes Intravenous Every 24 hours 09/16/23 1455 09/17/23 0840   09/17/23 1400  vancomycin  (VANCOREADY) IVPB 1250 mg/250 mL        1,250 mg 166.7 mL/hr over 90 Minutes Intravenous Every 24 hours 09/17/23 0840     09/16/23 1300  vancomycin  (VANCOREADY) IVPB 1500 mg/300 mL        1,500 mg 150 mL/hr over 120 Minutes Intravenous  Once 09/16/23 1144 09/16/23 1604   09/15/23 1300  cefTRIAXone  (ROCEPHIN ) 2 g in sodium chloride  0.9 % 100 mL IVPB  Status:  Discontinued        2 g 200 mL/hr over 30 Minutes Intravenous Every 24 hours 09/15/23 1251 09/16/23 1034   09/15/23 1030  cefTRIAXone  (ROCEPHIN ) 2 g in sodium chloride  0.9 % 100 mL IVPB        2 g 200 mL/hr over 30 Minutes Intravenous  Once 09/15/23 1019 09/15/23 1110        Medication:   amLODipine   10 mg Oral QPM   ascorbic acid   500 mg Oral Daily   aspirin   325 mg Oral Daily   Chlorhexidine  Gluconate Cloth  6 each Topical Daily   heparin   5,000 Units Subcutaneous Q8H   insulin  aspart  0-6 Units Subcutaneous TID WC   lactobacillus  1 g Oral TID WC   Latanoprostene Bunod   1 drop Both Eyes QHS   metoprolol  succinate  100 mg Oral Daily   multivitamin with minerals  1 tablet Oral Daily   niacin   1,000 mg Oral QHS   sodium chloride  flush  3 mL Intravenous Q12H   sodium chloride  flush  3 mL Intravenous Q12H   timolol   1 drop Both Eyes BID   traZODone   100 mg Oral QHS   vitamin E   400 Units Oral Daily    acetaminophen  **OR**  acetaminophen , bisacodyl , haloperidol  lactate, HYDROmorphone  (DILAUDID ) injection, ipratropium, levalbuterol , metoprolol  tartrate, ondansetron  **OR** ondansetron  (ZOFRAN ) IV, oxyCODONE , senna-docusate, sodium phosphate   Objective:   Vitals:   09/16/23 1800 09/16/23 2100 09/17/23 0100 09/17/23 0500  BP: (!) 155/52 (!) 157/61 (!) 118/50 (!) 144/50  Pulse: 65 62 62 66  Resp: 18 18 16 19   Temp:  97.7 F (36.5 C) 97.6 F (36.4 C) 99.2 F (37.3 C)  TempSrc:  Oral Oral Axillary  SpO2: 100% 100% 100% 100%  Weight:  76.2 kg  76.2 kg  Height:        Intake/Output Summary (Last 24 hours) at 09/17/2023 1146 Last data filed at 09/17/2023 1100 Gross per 24 hour  Intake 1830.59 ml  Output 1850 ml  Net -19.41 ml   Filed Weights   09/16/23 0000 09/16/23  2100 09/17/23 0500  Weight: 73.4 kg 76.2 kg 76.2 kg     Physical examination:        General:  AAO x 1  cooperative, no distress; somnolent this a.m.  HEENT:  Normocephalic, PERRL, otherwise with in Normal limits   Neuro:  CNII-XII intact. , normal motor and sensation, reflexes intact   Lungs:   Clear to auscultation BL, Respirations unlabored,  No wheezes / crackles  Cardio:    S1/S2, RRR, No murmure, No Rubs or Gallops   Abdomen:  Soft, non-tender, bowel sounds active all four quadrants, no guarding or peritoneal signs.  Muscular  skeletal:  Limited exam -global generalized weaknesses - in bed, able to move all 4 extremities,   2+ pulses,  symmetric, No pitting edema  Skin:  Dry, warm to touch, negative for any Rashes,  Wounds: Please see nursing documentation         ------------------------------------------------------------------------------------------------------------------------------------------    LABs:     Latest Ref Rng & Units 09/17/2023   12:23 AM 09/16/2023    9:12 AM 09/15/2023    9:05 AM  CBC  WBC 4.0 - 10.5 K/uL 10.2  12.6  19.8   Hemoglobin 13.0 - 17.0 g/dL 87.2  85.7  83.7   Hematocrit 39.0 - 52.0  % 36.2  41.0  46.3   Platelets 150 - 400 K/uL 138  142  175       Latest Ref Rng & Units 09/17/2023   12:23 AM 09/16/2023    9:12 AM 09/15/2023    9:05 AM  CMP  Glucose 70 - 99 mg/dL 888  888  859   BUN 8 - 23 mg/dL 29  30  37   Creatinine 0.61 - 1.24 mg/dL 8.87  8.90  8.68   Sodium 135 - 145 mmol/L 138  135  139   Potassium 3.5 - 5.1 mmol/L 4.1  3.9  4.5   Chloride 98 - 111 mmol/L 102  101  99   CO2 22 - 32 mmol/L 24  22  21    Calcium  8.9 - 10.3 mg/dL 8.6  8.6  9.3   Total Protein 6.5 - 8.1 g/dL 5.6  6.3  7.3   Total Bilirubin 0.0 - 1.2 mg/dL 0.9  0.9  1.7   Alkaline Phos 38 - 126 U/L 57  64  81   AST 15 - 41 U/L 151  202  334   ALT 0 - 44 U/L 68  70  75        Micro Results Recent Results (from the past 240 hours)  Culture, blood (routine x 2)     Status: None (Preliminary result)   Collection Time: 09/15/23  9:05 AM   Specimen: BLOOD  Result Value Ref Range Status   Specimen Description   Final    BLOOD LEFT ARM Performed at [facility], [address], [place], [place] [address]    Special Requests   Final    BOTTLES DRAWN AEROBIC AND ANAEROBIC Blood Culture adequate volume Performed at [facility], [address], [place], [place] [address]    Culture  Setup Time   Final    GRAM POSITIVE COCCI AEROBIC BOTTLE ONLY Gram Stain Report Called to,Read Back By and Verified With: ASHLEY S. ON 09/16/2023 @10 :28 BY T. HAMER  CRITICAL RESULT CALLED TO, READ BACK BY AND VERIFIED WITH: RN FREDRIK BERRY [id number] AT 1837 BY EC    Culture   Final    GRAM POSITIVE COCCI IDENTIFICATION  TO FOLLOW Performed at [facility], [address], [place], [place] [address]    Report Status PENDING  Incomplete  Blood Culture ID Panel (Reflexed)     Status: Abnormal   Collection Time: 09/15/23  9:05 AM  Result Value Ref Range Status   Enterococcus faecalis NOT DETECTED NOT DETECTED Final   Enterococcus Faecium NOT DETECTED NOT DETECTED Final   Listeria monocytogenes NOT DETECTED NOT  DETECTED Final   Staphylococcus species DETECTED (A) NOT DETECTED Final    Comment: CRITICAL RESULT CALLED TO, READ BACK BY AND VERIFIED WITH: RN FREDRIK BERRY [id number] AT 1837 BY EC    Staphylococcus aureus (BCID) NOT DETECTED NOT DETECTED Final   Staphylococcus epidermidis NOT DETECTED NOT DETECTED Final   Staphylococcus lugdunensis NOT DETECTED NOT DETECTED Final   Streptococcus species NOT DETECTED NOT DETECTED Final   Streptococcus agalactiae NOT DETECTED NOT DETECTED Final   Streptococcus pneumoniae NOT DETECTED NOT DETECTED Final   Streptococcus pyogenes NOT DETECTED NOT DETECTED Final   A.calcoaceticus-baumannii NOT DETECTED NOT DETECTED Final   Bacteroides fragilis NOT DETECTED NOT DETECTED Final   Enterobacterales NOT DETECTED NOT DETECTED Final   Enterobacter cloacae complex NOT DETECTED NOT DETECTED Final   Escherichia coli NOT DETECTED NOT DETECTED Final   Klebsiella aerogenes NOT DETECTED NOT DETECTED Final   Klebsiella oxytoca NOT DETECTED NOT DETECTED Final   Klebsiella pneumoniae NOT DETECTED NOT DETECTED Final   Proteus species NOT DETECTED NOT DETECTED Final   Salmonella species NOT DETECTED NOT DETECTED Final   Serratia marcescens NOT DETECTED NOT DETECTED Final   Haemophilus influenzae NOT DETECTED NOT DETECTED Final   Neisseria meningitidis NOT DETECTED NOT DETECTED Final   Pseudomonas aeruginosa NOT DETECTED NOT DETECTED Final   Stenotrophomonas maltophilia NOT DETECTED NOT DETECTED Final   Candida albicans NOT DETECTED NOT DETECTED Final   Candida auris NOT DETECTED NOT DETECTED Final   Candida glabrata NOT DETECTED NOT DETECTED Final   Candida krusei NOT DETECTED NOT DETECTED Final   Candida parapsilosis NOT DETECTED NOT DETECTED Final   Candida tropicalis NOT DETECTED NOT DETECTED Final   Cryptococcus neoformans/gattii NOT DETECTED NOT DETECTED Final    Comment: Performed at [facility], [address], [place], [place] [address]  Culture, blood  (routine x 2)     Status: None (Preliminary result)   Collection Time: 09/15/23  9:58 AM   Specimen: BLOOD  Result Value Ref Range Status   Specimen Description BLOOD BLOOD RIGHT HAND  Final   Special Requests   Final    BOTTLES DRAWN AEROBIC AND ANAEROBIC Blood Culture adequate volume   Culture   Final    NO GROWTH 2 DAYS Performed at [facility], [address] [place], [place] [address]    Report Status PENDING  Incomplete  Resp panel by RT-PCR (RSV, Flu A&B, Covid) Anterior Nasal Swab     Status: None   Collection Time: 09/15/23 11:17 AM   Specimen: Anterior Nasal Swab  Result Value Ref Range Status   SARS Coronavirus 2 by RT PCR NEGATIVE NEGATIVE Final    Comment: (NOTE) SARS-CoV-2 target nucleic acids are NOT DETECTED.  The SARS-CoV-2 RNA is generally detectable in upper respiratory specimens during the acute phase of infection. The lowest concentration of SARS-CoV-2 viral copies this assay can detect is 138 copies/mL. A negative result does not preclude SARS-Cov-2 infection and should not be used as the sole basis for treatment or other patient management decisions. A negative result may  occur with  improper specimen collection/handling, submission of specimen other than nasopharyngeal swab, presence of viral mutation(s) within the areas targeted by this assay, and inadequate number of viral copies(<138 copies/mL). A negative result must be combined with clinical observations, patient history, and epidemiological information. The expected result is Negative.  Fact Sheet for Patients:  BloggerCourse.com  Fact Sheet for Healthcare Providers:  SeriousBroker.it  This test is no t yet approved or cleared by the [place]  FDA and  has been authorized for detection and/or diagnosis of SARS-CoV-2 by FDA under an Emergency Use Authorization (EUA). This EUA will remain  in effect (meaning this test can be used) for the  duration of the COVID-19 declaration under Section 564(b)(1) of the Act, 21 U.S.C.section 360bbb-3(b)(1), unless the authorization is terminated  or revoked sooner.       Influenza A by PCR NEGATIVE NEGATIVE Final   Influenza B by PCR NEGATIVE NEGATIVE Final    Comment: (NOTE) The Xpert Xpress SARS-CoV-2/FLU/RSV plus assay is intended as an aid in the diagnosis of influenza from Nasopharyngeal swab specimens and should not be used as a sole basis for treatment. Nasal washings and aspirates are unacceptable for Xpert Xpress SARS-CoV-2/FLU/RSV testing.  Fact Sheet for Patients: BloggerCourse.com  Fact Sheet for Healthcare Providers: SeriousBroker.it  This test is not yet approved or cleared by the [place]  FDA and has been authorized for detection and/or diagnosis of SARS-CoV-2 by FDA under an Emergency Use Authorization (EUA). This EUA will remain in effect (meaning this test can be used) for the duration of the COVID-19 declaration under Section 564(b)(1) of the Act, 21 U.S.C. section 360bbb-3(b)(1), unless the authorization is terminated or revoked.     Resp Syncytial Virus by PCR NEGATIVE NEGATIVE Final    Comment: (NOTE) Fact Sheet for Patients: BloggerCourse.com  Fact Sheet for Healthcare Providers: SeriousBroker.it  This test is not yet approved or cleared by the [place]  FDA and has been authorized for detection and/or diagnosis of SARS-CoV-2 by FDA under an Emergency Use Authorization (EUA). This EUA will remain in effect (meaning this test can be used) for the duration of the COVID-19 declaration under Section 564(b)(1) of the Act, 21 U.S.C. section 360bbb-3(b)(1), unless the authorization is terminated or revoked.  Performed at [facility], [address], [place], [place] [address]   MRSA Next Gen by PCR, Nasal     Status: None   Collection Time:  09/16/23 12:00 AM   Specimen: Nasal Mucosa; Nasal Swab  Result Value Ref Range Status   MRSA by PCR Next Gen NOT DETECTED NOT DETECTED Final    Comment: (NOTE) The GeneXpert MRSA Assay (FDA approved for NASAL specimens only), is one component of a comprehensive MRSA colonization surveillance program. It is not intended to diagnose MRSA infection nor to guide or monitor treatment for MRSA infections. Test performance is not FDA approved in patients less than 40 years old. Performed at [facility], [address] [place], [place] [address]   Culture, blood (Routine X 2) w Reflex to ID Panel     Status: None (Preliminary result)   Collection Time: 09/17/23 12:20 AM   Specimen: BLOOD RIGHT ARM  Result Value Ref Range Status   Specimen Description BLOOD RIGHT ARM  Final   Special Requests   Final    BOTTLES DRAWN AEROBIC AND ANAEROBIC Blood Culture adequate volume   Culture   Final    NO GROWTH < 12 HOURS Performed at [facility], [address] [place],  [place] [address]    Report Status PENDING  Incomplete  Culture, blood (Routine X 2) w Reflex to ID Panel     Status: None (Preliminary result)   Collection Time: 09/17/23 12:23 AM   Specimen: BLOOD RIGHT HAND  Result Value Ref Range Status   Specimen Description BLOOD RIGHT HAND  Final   Special Requests AEROBIC BOTTLE ONLY Blood Culture adequate volume  Final   Culture   Final    NO GROWTH < 12 HOURS Performed at [facility], [address] [place], [place] [address]    Report Status PENDING  Incomplete    Radiology Reports No results found.   SIGNED: Adriana DELENA Grams, MD, FHM. FAAFP. Jolynn Pack - [facility] Critical care time spent - 55 min.  In seeing, evaluating and examining the patient. Reviewing medical records, labs, drawn plan of care. [facility],  Pager (please use [email or web address] to page/ text) Please use Epic Secure Chat for non-urgent communication (7AM-7PM)  If 7PM-7AM, please contact  night-coverage [email or web address], 09/17/2023, 11:46 AM

## 2023-09-18 ENCOUNTER — Other Ambulatory Visit (HOSPITAL_COMMUNITY): Payer: Self-pay | Admitting: *Deleted

## 2023-09-18 ENCOUNTER — Inpatient Hospital Stay (HOSPITAL_COMMUNITY)

## 2023-09-18 DIAGNOSIS — R7881 Bacteremia: Secondary | ICD-10-CM | POA: Diagnosis not present

## 2023-09-18 DIAGNOSIS — R6520 Severe sepsis without septic shock: Secondary | ICD-10-CM | POA: Diagnosis not present

## 2023-09-18 LAB — COMPREHENSIVE METABOLIC PANEL WITH GFR
ALT: 68 U/L — ABNORMAL HIGH (ref 0–44)
AST: 114 U/L — ABNORMAL HIGH (ref 15–41)
Albumin: 2.5 g/dL — ABNORMAL LOW (ref 3.5–5.0)
Alkaline Phosphatase: 55 U/L (ref 38–126)
Anion gap: 10 (ref 5–15)
BUN: 17 mg/dL (ref 8–23)
CO2: 23 mmol/L (ref 22–32)
Calcium: 8.5 mg/dL — ABNORMAL LOW (ref 8.9–10.3)
Chloride: 105 mmol/L (ref 98–111)
Creatinine, Ser: 1.02 mg/dL (ref 0.61–1.24)
GFR, Estimated: >60 mL/min (ref >60)
Glucose, Bld: 101 mg/dL — ABNORMAL HIGH (ref 70–99)
Potassium: 3.6 mmol/L (ref 3.5–5.1)
Sodium: 138 mmol/L (ref 135–145)
Total Bilirubin: 1 mg/dL (ref 0.0–1.2)
Total Protein: 5.5 g/dL — ABNORMAL LOW (ref 6.5–8.1)

## 2023-09-18 LAB — ECHOCARDIOGRAM COMPLETE
AR max vel: 2.12 cm2
AV Area VTI: 2.31 cm2
AV Area mean vel: 2.29 cm2
AV Mean grad: 6 mmHg
AV Peak grad: 13.1 mmHg
Ao pk vel: 1.81 m/s
Area-P 1/2: 3.12 cm2
Est EF: >75
Height: 68 in
S' Lateral: 1.9 cm
Weight: 2737.23 [oz_av]

## 2023-09-18 LAB — GLUCOSE, CAPILLARY
Glucose-Capillary: 112 mg/dL — ABNORMAL HIGH (ref 70–99)
Glucose-Capillary: 147 mg/dL — ABNORMAL HIGH (ref 70–99)
Glucose-Capillary: 141 mg/dL — ABNORMAL HIGH (ref 70–99)
Glucose-Capillary: 165 mg/dL — ABNORMAL HIGH (ref 70–99)

## 2023-09-18 LAB — CULTURE, BLOOD (ROUTINE X 2): Special Requests: ADEQUATE

## 2023-09-18 LAB — CK TOTAL AND CKMB (NOT AT ARMC)
CK, MB: 8.8 ng/mL — ABNORMAL HIGH (ref 0.5–5.0)
Total CK: 1097 U/L — ABNORMAL HIGH (ref 49–397)

## 2023-09-18 LAB — CBC
HCT: 38.1 % — ABNORMAL LOW (ref 39.0–52.0)
Hemoglobin: 13.1 g/dL (ref 13.0–17.0)
MCH: 32.6 pg (ref 26.0–34.0)
MCHC: 34.4 g/dL (ref 30.0–36.0)
MCV: 94.8 fL (ref 80.0–100.0)
Platelets: 150 K/uL (ref 150–400)
RBC: 4.02 MIL/uL — ABNORMAL LOW (ref 4.22–5.81)
RDW: 12.8 % (ref 11.5–15.5)
WBC: 8.6 K/uL (ref 4.0–10.5)
nRBC: 0 % (ref 0.0–0.2)

## 2023-09-18 LAB — CK: Total CK: 701 U/L — ABNORMAL HIGH (ref 49–397)

## 2023-09-18 MED ORDER — LABETALOL HCL 5 MG/ML IV SOLN: 10.0000 mg | INTRAVENOUS | Status: DC | PRN

## 2023-09-18 MED ORDER — TRAZODONE HCL 50 MG PO TABS
50.0000 mg | ORAL_TABLET | Freq: Every day | ORAL | Status: DC
  Administered 2023-09-18 – 2023-09-20 (×3): 50 mg via ORAL
  Filled 2023-09-18 (×3): qty 1

## 2023-09-18 MED ORDER — SENNOSIDES-DOCUSATE SODIUM 8.6-50 MG PO TABS: 1.0000 | ORAL_TABLET | Freq: Every day | ORAL | Status: DC

## 2023-09-18 MED ORDER — CO Q 10 100 MG PO CAPS: 1.0000 | ORAL_CAPSULE | Freq: Two times a day (BID) | ORAL | Status: DC

## 2023-09-18 MED ORDER — VITAMIN D 25 MCG (1000 UNIT) PO TABS
5000.0000 [IU] | ORAL_TABLET | Freq: Every day | ORAL | Status: DC
  Administered 2023-09-18 – 2023-09-21 (×4): 5000 [IU] via ORAL
  Filled 2023-09-18 (×4): qty 5

## 2023-09-18 MED ORDER — VITAMIN B-12 1000 MCG PO TABS
1000.0000 ug | ORAL_TABLET | Freq: Every day | ORAL | Status: DC
  Administered 2023-09-18 – 2023-09-21 (×4): 1000 ug via ORAL
  Filled 2023-09-18 (×4): qty 1

## 2023-09-18 MED ORDER — DOCUSATE SODIUM 100 MG PO CAPS
100.0000 mg | ORAL_CAPSULE | Freq: Every day | ORAL | Status: DC
  Administered 2023-09-18 – 2023-09-20 (×3): 100 mg via ORAL
  Filled 2023-09-18 (×3): qty 1

## 2023-09-18 MED ORDER — ALPRAZOLAM 1 MG PO TABS
1.0000 mg | ORAL_TABLET | Freq: Three times a day (TID) | ORAL | Status: DC | PRN
  Administered 2023-09-19: 1 mg via ORAL
  Filled 2023-09-18 (×2): qty 1

## 2023-09-18 MED ORDER — MELATONIN 3 MG PO TABS
6.0000 mg | ORAL_TABLET | Freq: Every day | ORAL | Status: DC
  Administered 2023-09-18 – 2023-09-20 (×3): 6 mg via ORAL
  Filled 2023-09-18 (×3): qty 2

## 2023-09-18 MED ORDER — MAGNESIUM GLUCONATE 500 (27 MG) MG PO TABS
500.0000 mg | ORAL_TABLET | Freq: Every day | ORAL | Status: DC
  Administered 2023-09-18 – 2023-09-20 (×3): 500 mg via ORAL
  Filled 2023-09-18 (×4): qty 1

## 2023-09-18 NOTE — Evaluation (Signed)
 Occupational Therapy Evaluation Patient Details Name: Stephen [name] MRN: [id number] DOB: 12/25/1935 Today's Date: 09/18/2023   History of Present Illness   Stephen [name] is a 88 year old male with extensive history of constipation, glaucoma, HTN, HLD, DM2, BPH, CKD, GERD, TIA,... Presented to ED after patient was found down by family at home.  Per family patient was found facedown on the tile floor outside the bathroom covered in liquid stool.  Patient  was found awake alert oriented x 2, unable to get up, and hold his weight on his legs.  Visible facial swelling on the left side and bruising on the right cheek.  Was not confirmed, unknown how long patient was down.  Last well-known was Wednesday.     Clinical Impressions Pt agreeable to OT and PT co-evaluation/treatment. Pt mostly oriented and able to follow commands fairly well. Pt required mod to max A for bed mobility and mod A for EOB to chair transfer with RW. Max to total assist for lower body ADL's and mod A for upper body based on poor balance seated at EOB as well as B UE weakness/limited range of motion. Pt able to ambulate a few feet from the chair using the RW as well. Pt left in the chair with call bell within reach and chair alarm set. Pt will benefit from continued OT in the hospital and recommended venue below to increase strength, balance, and endurance for safe ADL's.        If plan is discharge home, recommend the following:   A lot of help with walking and/or transfers;A lot of help with bathing/dressing/bathroom;Assistance with cooking/housework;Direct supervision/assist for medications management;Assist for transportation;Help with stairs or ramp for entrance     Functional Status Assessment   Patient has had a recent decline in their functional status and demonstrates the ability to make significant improvements in function in a reasonable and predictable amount of time.     Equipment Recommendations    None recommended by OT             Precautions/Restrictions   Precautions Precautions: Fall Recall of Precautions/Restrictions: Impaired Restrictions Weight Bearing Restrictions Per Provider Order: No     Mobility Bed Mobility Overal bed mobility: Needs Assistance Bed Mobility: Supine to Sit     Supine to sit: Mod assist, Max assist     General bed mobility comments: labored movement; assist to lift torso to upright position; much extended time    Transfers Overall transfer level: Needs assistance Equipment used: Rolling walker (2 wheels) Transfers: Sit to/from Stand, Bed to chair/wheelchair/BSC Sit to Stand: Mod assist     Step pivot transfers: Mod assist     General transfer comment: Unsteady and labored movement.      Balance Overall balance assessment: Needs assistance Sitting-balance support: Feet supported, No upper extremity supported Sitting balance-Leahy Scale: Poor Sitting balance - Comments: poor to fair seated at EOB; posterior lean Postural control: Posterior lean Standing balance support: Bilateral upper extremity supported, During functional activity, Reliant on assistive device for balance Standing balance-Leahy Scale: Poor Standing balance comment: poor to fair with RW                           ADL either performed or assessed with clinical judgement   ADL Overall ADL's : Needs assistance/impaired     Grooming: Minimal assistance;Sitting   Upper Body Bathing: Sitting;Moderate assistance   Lower Body Bathing: Maximal assistance;Total assistance;Sitting/lateral leans  Upper Body Dressing : Moderate assistance;Sitting   Lower Body Dressing: Maximal assistance;Total assistance;Sitting/lateral leans   Toilet Transfer: Moderate assistance;Rolling walker (2 wheels);Stand-pivot Statistician Details (indicate cue type and reason): EOB to chair with RW Toileting- Clothing Manipulation and Hygiene: Maximal assistance;Total  assistance;Bed level       Functional mobility during ADLs: Moderate assistance;Rolling walker (2 wheels) General ADL Comments: Able to take a few steps by the bed with RW today.     Vision Baseline Vision/History: 1 Wears glasses Ability to See in Adequate Light: 1 Impaired Vision Assessment?: No apparent visual deficits     Perception Perception: Not tested       Praxis Praxis: Not tested       Pertinent Vitals/Pain Pain Assessment Pain Assessment: Faces Faces Pain Scale: Hurts little more Pain Location: L arm and back Pain Descriptors / Indicators: Sore Pain Intervention(s): Monitored during session, Repositioned     Extremity/Trunk Assessment Upper Extremity Assessment Upper Extremity Assessment: Generalized weakness (3-/5 bilaterally for shoulder flexion MMT; Less than full P/ROM noted as well. Limited to 75 to 90% of shoulder flexion available range. Mild FM deficits bilaterally deu to slight shaking during dexterity testing.)   Lower Extremity Assessment Lower Extremity Assessment: Defer to PT evaluation   Cervical / Trunk Assessment Cervical / Trunk Assessment: Kyphotic   Communication Communication Communication: No apparent difficulties   Cognition Arousal: Alert Behavior During Therapy: WFL for tasks assessed/performed Cognition: No family/caregiver present to determine baseline             OT - Cognition Comments: Oriented to person, place, and year. Not oriented to reason for admission.                 Following commands: Intact       Cueing  General Comments   Cueing Techniques: Verbal cues;Tactile cues                 Home Living Family/patient expects to be discharged to:: Private residence Living Arrangements: Alone Available Help at Discharge: Family;Available PRN/intermittently (sister-in-law lives nearby. Brothers also live close.) Type of Home: House Home Access: Stairs to enter Secretary/administrator of Steps:  2 Entrance Stairs-Rails: Can reach both;Right;Left Home Layout: One level     Bathroom Shower/Tub: Producer, television/film/video: Standard Bathroom Accessibility: Yes How Accessible: Accessible via wheelchair;Accessible via walker Home Equipment: Grab bars - toilet          Prior Functioning/Environment Prior Level of Function : Independent/Modified Independent             Mobility Comments: Ambualtes outside without AD. Drives. ADLs Comments: Independent ADL's; has a person that helps with cleaning, eats out mostly.    OT Problem List: Decreased strength;Decreased range of motion;Decreased activity tolerance;Impaired balance (sitting and/or standing);Decreased coordination;Decreased cognition   OT Treatment/Interventions: Self-care/ADL training;Therapeutic exercise;DME and/or AE instruction;Therapeutic activities;Visual/perceptual remediation/compensation;Patient/family education;Balance training      OT Goals(Current goals can be found in the care plan section)   Acute Rehab OT Goals Patient Stated Goal: improve function OT Goal Formulation: With patient Time For Goal Achievement: 10/02/23 Potential to Achieve Goals: Good   OT Frequency:  Min 3X/week    Co-evaluation PT/OT/SLP Co-Evaluation/Treatment: Yes Reason for Co-Treatment: To address functional/ADL transfers   OT goals addressed during session: ADL's and self-care                       End of Session Equipment Utilized During Treatment: Rolling walker (  2 wheels);Gait belt  Activity Tolerance: Patient tolerated treatment well Patient left: in chair;with call bell/phone within reach;with chair alarm set  OT Visit Diagnosis: Unsteadiness on feet (R26.81);Other abnormalities of gait and mobility (R26.89);Muscle weakness (generalized) (M62.81);History of falling (Z91.81);Other symptoms and signs involving cognitive function                Time: 0833-0900 OT Time Calculation (min): 27  min Charges:  OT General Charges $OT Visit: 1 Visit OT Evaluation $OT Eval Low Complexity: 1 Low  Taisia Fantini OT, MOT   Jayson Person 09/18/2023, 1:42 PM

## 2023-09-18 NOTE — Progress Notes (Signed)
 No reports of diarrhea from night shift and no episodes of diarrhea this morning. MD notified to possibly discontinue c-diff order.

## 2023-09-18 NOTE — Progress Notes (Signed)
 Mobility Specialist Progress Note:    09/18/23 1400  Mobility  Activity Pivoted/transferred from chair to bed  Level of Assistance Maximum assist, patient does 25-49%  Assistive Device None  Distance Ambulated (ft) 2 ft  Range of Motion/Exercises Active;All extremities  Activity Response Tolerated well  Mobility Referral Yes  Mobility visit 1 Mobility  Mobility Specialist Start Time (ACUTE ONLY) 1400  Mobility Specialist Stop Time (ACUTE ONLY) 1420  Mobility Specialist Time Calculation (min) (ACUTE ONLY) 20 min   Pt received in chair, NT requesting help transferring to bed. Required MaxA to stand and pivot with no AD. Tolerated well,asx throughout. NT in room, all needs met.  Matthe Sloane Mobility Specialist Please contact via Special educational needs teacher or  Rehab office at [phone]

## 2023-09-18 NOTE — Progress Notes (Addendum)
 PROGRESS NOTE    Patient: Stephen [name]                            PCP: Shona Norleen PEDLAR, MD                    DOB: January 20, 1936            DOA: 09/15/2023 FMW:[id number]             DOS: 09/18/2023, 10:12 AM   LOS: 2 days   Date of Service: The patient was seen and examined on 09/18/2023  Subjective:   The patient was seen and examined this morning, stable no acute distress Nursing staff reporting no further diarrhea episodes - Hemodynamically stable Improved agitation, anxiety  Brief Narrative:   Stephen [name] is a 88 year old male with extensive history of constipation, glaucoma, HTN, HLD, DM2, BPH, CKD, GERD, TIA,... Presented to ED after patient was found down by family at home.  Per family patient was found facedown on the tile floor outside the bathroom covered in liquid stool.  Patient  was found awake alert oriented x 2, unable to get up, and hold his weight on his legs.  Visible facial swelling on the left side and bruising on the right cheek.  Was not confirmed, unknown how long patient was down.  Last well-known was Wednesday.  Patient was seen in the ED and was below chief complaint of constipation and impaction 2 days ago. Apparently at home he took a full bottle of magnesium  citrate. At this point patient has no complaints of pain, per family he was complaining of back pain at home. He is noted for progressive weakness and difficult to ambulate now.   ED evaluation: Blood pressure (!) 148/63, pulse 85, temperature 98.3 F (36.8 C), temperature source Rectal, resp. rate 17, height 5' 8 (1.727 m), weight 69.9 kg, SpO2 93%.  Labs: Glucose 140, BUN 37, creatinine 1.31, anion gap 19, creatinine 2.9, AST 334, ALT 75, total bilirubin 1.7, GFR 53, total CK 7000 422, troponin 113, 19, lactic acid 3, 2.3, WBC 19.8, Respiratory panel including;  RSV, influenza A/B COVID all negative UA large hemoglobin, ketones, negative for bacteria, WBC 0-5, chest x-ray clean CT head, chest,  cervical spine, maxillofacial area follow-up reviewed negative for any acute traumatic findings.  Right renal calculi, the largest 7 mm, but no evidence of passing stone or hydroureteronephrosis. 4. Liquid stool in the colon but no evidence of obstruction or colitis. 5. Lumbar degenerative changes. Potential for at least moderate spinal stenosis at L2-3 and L3-4.    Assessment & Plan:   Principal Problem:   Sepsis (HCC) Active Problems:   Rhabdomyolysis   Lactic acidosis   Elevated troponin   Falls   Acute metabolic encephalopathy   Bacteremia   Essential hypertension   Glaucoma, both eyes   Stage 2 chronic kidney disease   Carotid artery stenosis   Chronic constipation   Transaminitis   Mixed hyperlipidemia   Type 2 diabetes mellitus (HCC)     Assessment and Plan: * Sepsis (HCC)/bacteremia -Sepsis was ruled out  - Remained hemodynamically stable - Pulmonary blood cultures growing positive cocci - Repeat blood cultures no growth to date  - POA met SIRS criteria, with leukocytosis: WBC 19.8, creatinine 1.31, lactic acid 3.0,  - UA negative chest x-ray clean Presentation likely due to diarrhea, dehydration, lactic acidosis Remain afebrile, normotensive, -we will Continue IV  fluid resuscitation continue empiric antibiotic -switch to vancomycin   Bacteremia-  - Blood cultures from admission 09/15/2023 growing staph Caprae  ?  Contaminant - Repeat blood cultures -no growth to date -IV  Rocephin  to >>> IV vancomycin  - Hemodynamically stable - TTE: Mild LVH with LVEF 60-65%, grade 1 diastolic dysfunction. Upper    normal left atrial chamber size. No significant valvular    abnormalities.   Acute metabolic encephalopathy - Metabolic encephalopathy with confusion, agitation - Improving - Continue trazodone , as needed Xanax    CT head negative, MRI of the brain reviewed in detail-negative for any acute changes chronic microvascular changes - Will continue with  neurochecks  -History of TIA, continue aspirin , with holding statins due to transaminitis, rhabdomyolysis  Falls Generalized weakness, status post fall -Severe debility -Per POA needs assist with all ADLs and  - Consulting PT OT for evaluation, consulting TOC for possible placement  - S/p evaluation aside from superficial bruising no signs of traumatic laceration open wound CT head cervical spine chest abdomen all reviewed, negative for any acute traumatic findings  CT head, chest, cervical spine, maxillofacial area follow-up reviewed negative for any acute traumatic findings.  Right renal calculi, the largest 7 mm, but no evidence of passing stone or hydroureteronephrosis. 4. Liquid stool in the colon but no evidence of obstruction or colitis. 5. Lumbar degenerative changes. Potential for at least moderate spinal stenosis at L2-3 and L3-4.  - Fall precautions - MRI of the brain-reviewed, chronic microvascular changes Acute intracranial abnormalities   Elevated troponin Denies any chest pain this morning - Elevated troponin with elevated total CK likely ischemic demand - No EKG changes  Lactic acidosis -Resolved Lactic acidosis due to sepsis-ruled in - Lactic acid 3.0>> 2.3 -  WBC 19.8 >> 12.6, 10.2, 8.6 -Afebrile hypertensive -Blood cultures are growing staph-possible contaminant repeat cultures negative -on IV vancomycin   09/16/2023  - UA-negative, chest x-ray within normal limits-no source of infection   Rhabdomyolysis Due to poor wound mobility was found down - Total CK elevated to 7,422>>> 1809  - With AKI with a creatinine of 1.31 -S/p aggressive IV fluid resuscitation-continue maintenance IV fluid with LR 150 mL -Will monitor CK and creatinine closely  Transaminitis -Monitoring LFTs - Hepatitis panel-nonreactive - Avoiding hepatotoxins  Chronic constipation C/o loose stool-diarrhea-no further episode of diarrhea in the past 24 hours - Status post ingestion  of possible multiple bottles of mag citrate at home -Withholding bowel regimen -Monitoring closely  Carotid artery stenosis - Denies any chest pain - Continue aspirin , holding statins (due to transaminitis)  Stage 2 chronic kidney disease Acute on chronic stage II CKD-improving creatinine - POA: BUN 37, GFR 53 - Continue IV fluid resuscitation -Avoid nephrotoxin, hypotension Lab Results  Component Value Date   CREATININE 1.12 09/17/2023   CREATININE 1.09 09/16/2023   CREATININE 1.31 (H) 09/15/2023     Glaucoma, both eyes Will continue eyedrop regimen including timolol , high-dose vitamin E   Essential hypertension - Hypertensive this a.m. - Resuming home medication of Norvasc  and metoprolol  -Along with as needed hydralazine  Type 2 diabetes mellitus (HCC) - Holding home medication, checking CBG q. ACH, SSI coverage - A1C: 6.4   CBG (last 3)  Recent Labs    09/17/23 1628 09/17/23 2028 09/18/23 0717  GLUCAP 99 110* 112*     Mixed hyperlipidemia - Holding home medication of Crestor  due to transaminitis, and rhabdo myelitis      ----------------------------------------------------------------------------------------------------------------------------------- Nutritional status:  The patient's BMI is: Body mass index is  26.01 kg/m. I agree with the assessment and plan as outlined ---------------------------------------------------------------------------------------------------------------------------------------- Cultures; Blood Cultures x 2 >> gram-positive cocci Urine Culture  >>>   IV antibiotics Rocephin -D/Ced 09/16/2023,  8/28 Vancomycin  -    ----------------------------------------------------------------------------------------------------------------------------------------- DVT prophylaxis:  heparin  injection 5,000 Units Start: 09/15/23 1400 SCDs Start: 09/15/23 1220   Code Status:   Code Status: Do not attempt resuscitation (DNR) PRE-ARREST  INTERVENTIONS DESIRED  Family Communication: No family member present at bedside-  -Advance care planning has been discussed.   Admission status:   Status is: Observation The patient remains OBS appropriate and will d/c before 2 midnights.   Disposition: From  - home             Planning for discharge in 1-2 days   Procedures:   No admission procedures for hospital encounter.   Antimicrobials:  Anti-infectives (From admission, onward)    Start     Dose/Rate Route Frequency Ordered Stop   09/17/23 1400  vancomycin  (VANCOCIN ) IVPB 1000 mg/200 mL premix  Status:  Discontinued        1,000 mg 200 mL/hr over 60 Minutes Intravenous Every 24 hours 09/16/23 1455 09/17/23 0840   09/17/23 1400  vancomycin  (VANCOREADY) IVPB 1250 mg/250 mL        1,250 mg 166.7 mL/hr over 90 Minutes Intravenous Every 24 hours 09/17/23 0840     09/16/23 1300  vancomycin  (VANCOREADY) IVPB 1500 mg/300 mL        1,500 mg 150 mL/hr over 120 Minutes Intravenous  Once 09/16/23 1144 09/16/23 1604   09/15/23 1300  cefTRIAXone  (ROCEPHIN ) 2 g in sodium chloride  0.9 % 100 mL IVPB  Status:  Discontinued        2 g 200 mL/hr over 30 Minutes Intravenous Every 24 hours 09/15/23 1251 09/16/23 1034   09/15/23 1030  cefTRIAXone  (ROCEPHIN ) 2 g in sodium chloride  0.9 % 100 mL IVPB        2 g 200 mL/hr over 30 Minutes Intravenous  Once 09/15/23 1019 09/15/23 1110        Medication:   amLODipine   10 mg Oral QPM   ascorbic acid   500 mg Oral Daily   aspirin   325 mg Oral Daily   Chlorhexidine  Gluconate Cloth  6 each Topical Daily   cholecalciferol   5,000 Units Oral Daily   cyanocobalamin   1,000 mcg Oral Daily   docusate sodium   100 mg Oral Daily   heparin   5,000 Units Subcutaneous Q8H   insulin  aspart  0-6 Units Subcutaneous TID WC   lactobacillus  1 g Oral TID WC   Latanoprostene Bunod   1 drop Both Eyes QHS   magnesium  gluconate  500 mg Oral QHS   metoprolol  succinate  100 mg Oral Daily   multivitamin with  minerals  1 tablet Oral Daily   niacin   1,000 mg Oral QHS   sodium chloride  flush  3 mL Intravenous Q12H   sodium chloride  flush  3 mL Intravenous Q12H   timolol   1 drop Both Eyes BID   traZODone   100 mg Oral QHS   vitamin E   400 Units Oral Daily    acetaminophen  **OR** acetaminophen , ALPRAZolam , bisacodyl , HYDROmorphone  (DILAUDID ) injection, ipratropium, labetalol , levalbuterol , metoprolol  tartrate, ondansetron  **OR** ondansetron  (ZOFRAN ) IV, oxyCODONE , senna-docusate, sodium phosphate   Objective:   Vitals:   09/17/23 1754 09/17/23 2031 09/18/23 0500 09/18/23 0631  BP: (!) 158/61 (!) 142/86  (!) 171/73  Pulse: 61 67  82  Resp: 16 20  20   Temp:  98.3  F (36.8 C)  98.5 F (36.9 C)  TempSrc:  Oral  Axillary  SpO2:  100%  97%  Weight:   77.6 kg   Height:        Intake/Output Summary (Last 24 hours) at 09/18/2023 1012 Last data filed at 09/18/2023 0839 Gross per 24 hour  Intake 480 ml  Output 1100 ml  Net -620 ml   Filed Weights   09/16/23 2100 09/17/23 0500 09/18/23 0500  Weight: 76.2 kg 76.2 kg 77.6 kg     Physical examination:      General:  AAO x 3,  cooperative, no distress;   HEENT:  Normocephalic, PERRL, otherwise with in Normal limits   Neuro:  CNII-XII intact. , normal motor and sensation, reflexes intact   Lungs:   Clear to auscultation BL, Respirations unlabored,  No wheezes / crackles  Cardio:    S1/S2, RRR, No murmure, No Rubs or Gallops   Abdomen:  Soft, non-tender, bowel sounds active all four quadrants, no guarding or peritoneal signs.  Muscular  skeletal:  Limited exam -global generalized weaknesses - in bed, able to move all 4 extremities,   2+ pulses,  symmetric, No pitting edema  Skin:  Dry, warm to touch, negative for any Rashes,  Wounds: Please see nursing documentation   ----------------------------------------------------------------------------------------------------------    LABs:     Latest Ref Rng & Units 09/18/2023    4:43 AM  09/17/2023   12:23 AM 09/16/2023    9:12 AM  CBC  WBC 4.0 - 10.5 K/uL 8.6  10.2  12.6   Hemoglobin 13.0 - 17.0 g/dL 86.8  87.2  85.7   Hematocrit 39.0 - 52.0 % 38.1  36.2  41.0   Platelets 150 - 400 K/uL 150  138  142       Latest Ref Rng & Units 09/17/2023   12:23 AM 09/16/2023    9:12 AM 09/15/2023    9:05 AM  CMP  Glucose 70 - 99 mg/dL 888  888  859   BUN 8 - 23 mg/dL 29  30  37   Creatinine 0.61 - 1.24 mg/dL 8.87  8.90  8.68   Sodium 135 - 145 mmol/L 138  135  139   Potassium 3.5 - 5.1 mmol/L 4.1  3.9  4.5   Chloride 98 - 111 mmol/L 102  101  99   CO2 22 - 32 mmol/L 24  22  21    Calcium  8.9 - 10.3 mg/dL 8.6  8.6  9.3   Total Protein 6.5 - 8.1 g/dL 5.6  6.3  7.3   Total Bilirubin 0.0 - 1.2 mg/dL 0.9  0.9  1.7   Alkaline Phos 38 - 126 U/L 57  64  81   AST 15 - 41 U/L 151  202  334   ALT 0 - 44 U/L 68  70  75        Micro Results Recent Results (from the past 240 hours)  Culture, blood (routine x 2)     Status: Abnormal   Collection Time: 09/15/23  9:05 AM   Specimen: BLOOD  Result Value Ref Range Status   Specimen Description   Final    BLOOD LEFT ARM Performed at [facility], [address], [place], [place] [address]    Special Requests   Final    BOTTLES DRAWN AEROBIC AND ANAEROBIC Blood Culture adequate volume Performed at [facility], [address], [place], [place] [address]    Culture  Setup Time  Final    GRAM POSITIVE COCCI AEROBIC BOTTLE ONLY Gram Stain Report Called to,Read Back By and Verified With: ASHLEY S. ON 09/16/2023 @10 :28 BY T. HAMER  CRITICAL RESULT CALLED TO, READ BACK BY AND VERIFIED WITH: RN FREDRIK BERRY [id number] AT 1837 BY EC    Culture (A)  Final    STAPHYLOCOCCUS CAPRAE THE SIGNIFICANCE OF ISOLATING THIS ORGANISM FROM A SINGLE SET OF BLOOD CULTURES WHEN MULTIPLE SETS ARE DRAWN IS UNCERTAIN. PLEASE NOTIFY THE MICROBIOLOGY DEPARTMENT WITHIN ONE WEEK IF SPECIATION AND SENSITIVITIES ARE REQUIRED. Performed at [facility], [address], [place], [place] [address]    Report Status 09/18/2023 FINAL  Final  Blood Culture ID Panel (Reflexed)     Status: Abnormal   Collection Time: 09/15/23  9:05 AM  Result Value Ref Range Status   Enterococcus faecalis NOT DETECTED NOT DETECTED Final   Enterococcus Faecium NOT DETECTED NOT DETECTED Final   Listeria monocytogenes NOT DETECTED NOT DETECTED Final   Staphylococcus species DETECTED (A) NOT DETECTED Final    Comment: CRITICAL RESULT CALLED TO, READ BACK BY AND VERIFIED WITH: RN FREDRIK BERRY [id number] AT 1837 BY EC    Staphylococcus aureus (BCID) NOT DETECTED NOT DETECTED Final   Staphylococcus epidermidis NOT DETECTED NOT DETECTED Final   Staphylococcus lugdunensis NOT DETECTED NOT DETECTED Final   Streptococcus species NOT DETECTED NOT DETECTED Final   Streptococcus agalactiae NOT DETECTED NOT DETECTED Final   Streptococcus pneumoniae NOT DETECTED NOT DETECTED Final   Streptococcus pyogenes NOT DETECTED NOT DETECTED Final   A.calcoaceticus-baumannii NOT DETECTED NOT DETECTED Final   Bacteroides fragilis NOT DETECTED NOT DETECTED Final   Enterobacterales NOT DETECTED NOT DETECTED Final   Enterobacter cloacae complex NOT DETECTED NOT DETECTED Final   Escherichia coli NOT DETECTED NOT DETECTED Final   Klebsiella aerogenes NOT DETECTED NOT DETECTED Final   Klebsiella oxytoca NOT DETECTED NOT DETECTED Final   Klebsiella pneumoniae NOT DETECTED NOT DETECTED Final   Proteus species NOT DETECTED NOT DETECTED Final   Salmonella species NOT DETECTED NOT DETECTED Final   Serratia marcescens NOT DETECTED NOT DETECTED Final   Haemophilus influenzae NOT DETECTED NOT DETECTED Final   Neisseria meningitidis NOT DETECTED NOT DETECTED Final   Pseudomonas aeruginosa NOT DETECTED NOT DETECTED Final   Stenotrophomonas maltophilia NOT DETECTED NOT DETECTED Final   Candida albicans NOT DETECTED NOT DETECTED Final   Candida auris NOT DETECTED NOT DETECTED Final   Candida glabrata NOT DETECTED  NOT DETECTED Final   Candida krusei NOT DETECTED NOT DETECTED Final   Candida parapsilosis NOT DETECTED NOT DETECTED Final   Candida tropicalis NOT DETECTED NOT DETECTED Final   Cryptococcus neoformans/gattii NOT DETECTED NOT DETECTED Final    Comment: Performed at [facility], [address], [place], [place] [address]  Culture, blood (routine x 2)     Status: None (Preliminary result)   Collection Time: 09/15/23  9:58 AM   Specimen: BLOOD  Result Value Ref Range Status   Specimen Description BLOOD BLOOD RIGHT HAND  Final   Special Requests   Final    BOTTLES DRAWN AEROBIC AND ANAEROBIC Blood Culture adequate volume   Culture   Final    NO GROWTH 3 DAYS Performed at [facility], [address], [place], [place] [address]    Report Status PENDING  Incomplete  Resp panel by RT-PCR (RSV, Flu A&B, Covid) Anterior Nasal Swab     Status: None   Collection Time: 09/15/23 11:17 AM  Specimen: Anterior Nasal Swab  Result Value Ref Range Status   SARS Coronavirus 2 by RT PCR NEGATIVE NEGATIVE Final    Comment: (NOTE) SARS-CoV-2 target nucleic acids are NOT DETECTED.  The SARS-CoV-2 RNA is generally detectable in upper respiratory specimens during the acute phase of infection. The lowest concentration of SARS-CoV-2 viral copies this assay can detect is 138 copies/mL. A negative result does not preclude SARS-Cov-2 infection and should not be used as the sole basis for treatment or other patient management decisions. A negative result may occur with  improper specimen collection/handling, submission of specimen other than nasopharyngeal swab, presence of viral mutation(s) within the areas targeted by this assay, and inadequate number of viral copies(<138 copies/mL). A negative result must be combined with clinical observations, patient history, and epidemiological information. The expected result is Negative.  Fact Sheet for Patients:   BloggerCourse.com  Fact Sheet for Healthcare Providers:  SeriousBroker.it  This test is no t yet approved or cleared by the [place]  FDA and  has been authorized for detection and/or diagnosis of SARS-CoV-2 by FDA under an Emergency Use Authorization (EUA). This EUA will remain  in effect (meaning this test can be used) for the duration of the COVID-19 declaration under Section 564(b)(1) of the Act, 21 U.S.C.section 360bbb-3(b)(1), unless the authorization is terminated  or revoked sooner.       Influenza A by PCR NEGATIVE NEGATIVE Final   Influenza B by PCR NEGATIVE NEGATIVE Final    Comment: (NOTE) The Xpert Xpress SARS-CoV-2/FLU/RSV plus assay is intended as an aid in the diagnosis of influenza from Nasopharyngeal swab specimens and should not be used as a sole basis for treatment. Nasal washings and aspirates are unacceptable for Xpert Xpress SARS-CoV-2/FLU/RSV testing.  Fact Sheet for Patients: BloggerCourse.com  Fact Sheet for Healthcare Providers: SeriousBroker.it  This test is not yet approved or cleared by the [place]  FDA and has been authorized for detection and/or diagnosis of SARS-CoV-2 by FDA under an Emergency Use Authorization (EUA). This EUA will remain in effect (meaning this test can be used) for the duration of the COVID-19 declaration under Section 564(b)(1) of the Act, 21 U.S.C. section 360bbb-3(b)(1), unless the authorization is terminated or revoked.     Resp Syncytial Virus by PCR NEGATIVE NEGATIVE Final    Comment: (NOTE) Fact Sheet for Patients: BloggerCourse.com  Fact Sheet for Healthcare Providers: SeriousBroker.it  This test is not yet approved or cleared by the [place]  FDA and has been authorized for detection and/or diagnosis of SARS-CoV-2 by FDA under an Emergency Use  Authorization (EUA). This EUA will remain in effect (meaning this test can be used) for the duration of the COVID-19 declaration under Section 564(b)(1) of the Act, 21 U.S.C. section 360bbb-3(b)(1), unless the authorization is terminated or revoked.  Performed at [facility], [address], [place], [place] [address]   MRSA Next Gen by PCR, Nasal     Status: None   Collection Time: 09/16/23 12:00 AM   Specimen: Nasal Mucosa; Nasal Swab  Result Value Ref Range Status   MRSA by PCR Next Gen NOT DETECTED NOT DETECTED Final    Comment: (NOTE) The GeneXpert MRSA Assay (FDA approved for NASAL specimens only), is one component of a comprehensive MRSA colonization surveillance program. It is not intended to diagnose MRSA infection nor to guide or monitor treatment for MRSA infections. Test performance is not FDA approved in patients less than 56 years old. Performed at [facility], [address] [place],  [place] [address]   Culture, blood (Routine X 2) w Reflex to ID Panel     Status: None (Preliminary result)   Collection Time: 09/17/23 12:20 AM   Specimen: BLOOD RIGHT ARM  Result Value Ref Range Status   Specimen Description BLOOD RIGHT ARM  Final   Special Requests   Final    BOTTLES DRAWN AEROBIC AND ANAEROBIC Blood Culture adequate volume   Culture   Final    NO GROWTH 1 DAY Performed at [facility], [address] [place], [place] [address]    Report Status PENDING  Incomplete  Culture, blood (Routine X 2) w Reflex to ID Panel     Status: None (Preliminary result)   Collection Time: 09/17/23 12:23 AM   Specimen: BLOOD RIGHT HAND  Result Value Ref Range Status   Specimen Description BLOOD RIGHT HAND  Final   Special Requests AEROBIC BOTTLE ONLY Blood Culture adequate volume  Final   Culture   Final    NO GROWTH 1 DAY Performed at [facility], [address], [place], [place] [address]    Report Status PENDING  Incomplete    Radiology Reports No results  found.   SIGNED: Adriana DELENA Grams, MD, FHM. FAAFP. Jolynn Pack - [facility] Critical care time spent - 55 min.  In seeing, evaluating and examining the patient. Reviewing medical records, labs, drawn plan of care. [facility],  Pager (please use [email or web address] to page/ text) Please use Epic Secure Chat for non-urgent communication (7AM-7PM)  If 7PM-7AM, please contact night-coverage [email or web address], 09/18/2023, 10:12 AM

## 2023-09-18 NOTE — TOC Progression Note (Addendum)
 Transition of Care [facility]) - Progression Note    Patient Details  Name: Stephen [name] MRN: [id number] Date of Birth: Dec 08, 1935  Transition of Care [facility] CM/SW Contact  Hoy DELENA Bigness, LCSW Phone Number: 09/18/2023, 9:46 AM  Clinical Narrative:    Pt currently with no bed offers. Several facilities considering following improvement in mentation/behaviors. Will need to have a period of 48 hours with no physical/chemical restraints. ICM will continue to follow.    Expected Discharge Plan: Skilled Nursing Facility Barriers to Discharge: Continued Medical Work up               Expected Discharge Plan and Services In-house Referral: Clinical Social Work Discharge Planning Services: CM Consult Post Acute Care Choice: Skilled Nursing Facility Living arrangements for the past 2 months: Single Family Home                                       Social Drivers of Health (SDOH) Interventions SDOH Screenings   Food Insecurity: Patient Unable To Answer (09/15/2023)  Housing: Unknown (09/15/2023)  Transportation Needs: Patient Unable To Answer (09/15/2023)  Utilities: Patient Unable To Answer (09/15/2023)  Depression (PHQ2-9): Low Risk  (10/29/2021)  Financial Resource Strain: Low Risk  (10/29/2021)  Social Connections: Unknown (09/15/2023)  Stress: No Stress Concern Present (10/29/2021)  Tobacco Use: Low Risk  (11/24/2021)    Readmission Risk Interventions    09/17/2023    2:56 PM  Readmission Risk Prevention Plan  Transportation Screening Complete  PCP or Specialist Appt within 3-5 Days Complete  HRI or Home Care Consult Complete  Social Work Consult for Recovery Care Planning/Counseling Complete  Palliative Care Screening Not Applicable  Medication Review Oceanographer) Complete

## 2023-09-18 NOTE — Progress Notes (Signed)
 Patient drowsy today, but calm and pleasant. Awakens easily with verbal stimuli and touch. Mittens were removed this morning. Ate very little for breakfast. Out of bed after breakfast to chair. He ate 100% of lunch by himself with minimal assistance from staff. Back to bed after lunch. He has not attempted to get up unassisted or pull at IV lines. Very cooperative with staff. Floor mats in place, bed alarm on, call bell within reach.

## 2023-09-18 NOTE — Progress Notes (Signed)
*  PRELIMINARY RESULTS* Echocardiogram 2D Echocardiogram has been performed.  Stephen [name] 09/18/2023, 4:32 PM

## 2023-09-18 NOTE — Plan of Care (Signed)
  Problem: Acute Rehab OT Goals (only OT should resolve) Goal: Pt. Will Perform Grooming Flowsheets (Taken 09/18/2023 1345) Pt Will Perform Grooming:  with contact guard assist  standing Goal: Pt. Will Perform Upper Body Dressing Flowsheets (Taken 09/18/2023 1345) Pt Will Perform Upper Body Dressing:  with modified independence  sitting Goal: Pt. Will Perform Lower Body Dressing Flowsheets (Taken 09/18/2023 1345) Pt Will Perform Lower Body Dressing:  with modified independence  sitting/lateral leans Goal: Pt. Will Transfer To Toilet Flowsheets (Taken 09/18/2023 1345) Pt Will Transfer to Toilet:  with modified independence  with supervision  ambulating Goal: Pt. Will Perform Toileting-Clothing Manipulation Flowsheets (Taken 09/18/2023 1345) Pt Will Perform Toileting - Clothing Manipulation and hygiene:  with contact guard assist  with supervision  sitting/lateral leans Goal: Pt/Caregiver Will Perform Home Exercise Program Flowsheets (Taken 09/18/2023 1345) Pt/caregiver will Perform Home Exercise Program:  Increased ROM  Increased strength  Both right and left upper extremity  Independently  Deliyah Muckle OT, MOT

## 2023-09-18 NOTE — Progress Notes (Signed)
 Physical Therapy Treatment Patient Details Name: Stephen [name] MRN: [id number] DOB: Jun 06, 1935 Today's Date: 09/18/2023   History of Present Illness Stephen [name] is a 88 year old male with extensive history of constipation, glaucoma, HTN, HLD, DM2, BPH, CKD, GERD, TIA,... Presented to ED after patient was found down by family at home.  Per family patient was found facedown on the tile floor outside the bathroom covered in liquid stool.  Patient  was found awake alert oriented x 2, unable to get up, and hold his weight on his legs.  Visible facial swelling on the left side and bruising on the right cheek.  Was not confirmed, unknown how long patient was down.  Last well-known was Wednesday.    PT Comments  Patient agreeable to PT session today and is more alert. Patient demonstrates slow labored movement for sitting up at bedside and requires much physical assist for uprighting positioning and scooting EOB. Patient presents with BIL LE weakness and poor balance (sitting and standing) that are significantly impacting patient's current functional status. Patient able to complete very short distance ambulation at bedside using RW but limited mostly to poor activity tolerance. Patient will benefit from continued skilled physical therapy in hospital and recommended venue below to increase strength, balance, endurance for safe ADLs and gait.     If plan is discharge home, recommend the following: A lot of help with bathing/dressing/bathroom;A lot of help with walking and/or transfers;Help with stairs or ramp for entrance;Assist for transportation   Can travel by private vehicle     No  Equipment Recommendations  None recommended by PT    Recommendations for Other Services       Precautions / Restrictions Precautions Precautions: Fall Recall of Precautions/Restrictions: Impaired Restrictions Weight Bearing Restrictions Per Provider Order: No     Mobility  Bed Mobility Overal bed  mobility: Needs Assistance Bed Mobility: Supine to Sit     Supine to sit: Mod assist, Max assist     General bed mobility comments: labored movement; assist to lift torso to upright position; much extended time    Transfers Overall transfer level: Needs assistance Equipment used: Rolling walker (2 wheels) Transfers: Sit to/from Stand, Bed to chair/wheelchair/BSC Sit to Stand: Mod assist   Step pivot transfers: Mod assist       General transfer comment: Unsteady and labored movement.    Ambulation/Gait Ambulation/Gait assistance: Mod assist Gait Distance (Feet): 10 Feet Assistive device: Rolling walker (2 wheels) Gait Pattern/deviations: Step-to pattern, Shuffle, Trunk flexed, Knee flexed in stance - left, Knee flexed in stance - right Gait velocity: slow     General Gait Details: Slow shuffling steps with heavy reliance on RW for a balance, poor walking tolerance   Stairs             Wheelchair Mobility     Tilt Bed    Modified Rankin (Stroke Patients Only)       Balance Overall balance assessment: Needs assistance Sitting-balance support: Feet supported, No upper extremity supported Sitting balance-Leahy Scale: Poor Sitting balance - Comments: poor to fair seated at EOB; posterior lean Postural control: Posterior lean Standing balance support: Bilateral upper extremity supported, During functional activity, Reliant on assistive device for balance Standing balance-Leahy Scale: Poor Standing balance comment: poor with RW                            Communication Communication Communication: No apparent difficulties  Cognition Arousal: Alert Behavior  During Therapy: WFL for tasks assessed/performed   PT - Cognitive impairments: No family/caregiver present to determine baseline                       PT - Cognition Comments: Oriented x3 Following commands: Intact      Cueing Cueing Techniques: Verbal cues, Tactile cues   Exercises      General Comments        Pertinent Vitals/Pain Pain Assessment Pain Assessment: Faces Faces Pain Scale: Hurts little more Pain Location: L arm and back Pain Descriptors / Indicators: Sore Pain Intervention(s): Monitored during session, Repositioned    Home Living Family/patient expects to be discharged to:: Private residence Living Arrangements: Alone Available Help at Discharge: Family;Available PRN/intermittently (Family members live close by) Type of Home: House Home Access: Stairs to enter Entrance Stairs-Rails: Can reach both;Right;Left Entrance Stairs-Number of Steps: 2   Home Layout: One level Home Equipment: Grab bars - toilet Additional Comments: per chart review lives alone although patient states his sister in law lives upstairs    Prior Function            PT Goals (current goals can now be found in the care plan section) Acute Rehab PT Goals Patient Stated Goal: return home PT Goal Formulation: With patient Time For Goal Achievement: 10/02/23 Potential to Achieve Goals: Good    Frequency    Min 3X/week      PT Plan      Co-evaluation PT/OT/SLP Co-Evaluation/Treatment: Yes Reason for Co-Treatment: To address functional/ADL transfers PT goals addressed during session: Mobility/safety with mobility;Proper use of DME;Balance OT goals addressed during session: ADL's and self-care      AM-PAC PT 6 Clicks Mobility   Outcome Measure  Help needed turning from your back to your side while in a flat bed without using bedrails?: A Lot Help needed moving from lying on your back to sitting on the side of a flat bed without using bedrails?: A Lot Help needed moving to and from a bed to a chair (including a wheelchair)?: A Lot Help needed standing up from a chair using your arms (e.g., wheelchair or bedside chair)?: A Lot Help needed to walk in hospital room?: A Lot Help needed climbing 3-5 steps with a railing? : Total 6 Click Score:  11    End of Session Equipment Utilized During Treatment: Gait belt Activity Tolerance: Patient limited by fatigue Patient left: in chair;with call bell/phone within reach;with chair alarm set Nurse Communication: Mobility status PT Visit Diagnosis: Repeated falls (R29.6);Muscle weakness (generalized) (M62.81);History of falling (Z91.81);Other abnormalities of gait and mobility (R26.89)     Time: 0830-0900 PT Time Calculation (min) (ACUTE ONLY): 30 min  Charges:    $Therapeutic Activity: 23-37 mins PT General Charges $$ ACUTE PT VISIT: 1 Visit                     2:15 PM, 09/18/23,  Leathie Weich, SPT

## 2023-09-18 NOTE — Progress Notes (Signed)
 SLP Cancellation Note  Patient Details Name: Stephen [name] MRN: [id number] DOB: 04-11-1935   Cancelled treatment:       Reason Eval/Treat Not Completed: Other (comment); SLE ordered on Friday, however imaging negative for acute changes. Baseline cognition is unknown and Pt was lethargic and drowsy. Unable to complete SLE this date, will check back tomorrow to see if SLE is indicated.  Thank you,  Lamar Candy, CCC-SLP [phone]    Stephen [name] 09/18/2023, 4:50 PM

## 2023-09-19 DIAGNOSIS — R8271 Bacteriuria: Secondary | ICD-10-CM

## 2023-09-19 DIAGNOSIS — R6510 Systemic inflammatory response syndrome (SIRS) of non-infectious origin without acute organ dysfunction: Secondary | ICD-10-CM

## 2023-09-19 DIAGNOSIS — G9341 Metabolic encephalopathy: Secondary | ICD-10-CM | POA: Diagnosis not present

## 2023-09-19 LAB — CBC
HCT: 40.1 % (ref 39.0–52.0)
Hemoglobin: 13.9 g/dL (ref 13.0–17.0)
MCH: 32.9 pg (ref 26.0–34.0)
MCHC: 34.7 g/dL (ref 30.0–36.0)
MCV: 95 fL (ref 80.0–100.0)
Platelets: 169 K/uL (ref 150–400)
RBC: 4.22 MIL/uL (ref 4.22–5.81)
RDW: 12.6 % (ref 11.5–15.5)
WBC: 9.5 K/uL (ref 4.0–10.5)
nRBC: 0 % (ref 0.0–0.2)

## 2023-09-19 LAB — GLUCOSE, CAPILLARY
Glucose-Capillary: 138 mg/dL — ABNORMAL HIGH (ref 70–99)
Glucose-Capillary: 189 mg/dL — ABNORMAL HIGH (ref 70–99)
Glucose-Capillary: 166 mg/dL — ABNORMAL HIGH (ref 70–99)
Glucose-Capillary: 121 mg/dL — ABNORMAL HIGH (ref 70–99)

## 2023-09-19 MED ORDER — FLORANEX PO PACK: 1.0000 g | PACK | Freq: Three times a day (TID) | ORAL | 0 refills | Status: AC

## 2023-09-19 MED ORDER — INSULIN ASPART 100 UNIT/ML IJ SOLN: 0.0000 [IU] | Freq: Three times a day (TID) | INTRAMUSCULAR | 11 refills | Status: AC

## 2023-09-19 MED ORDER — MELATONIN 3 MG PO TABS: 6.0000 mg | ORAL_TABLET | Freq: Every day | ORAL | 0 refills | Status: AC

## 2023-09-19 MED ORDER — LACTATED RINGERS IV SOLN: INTRAVENOUS | Status: AC

## 2023-09-19 MED ORDER — TRAZODONE HCL 50 MG PO TABS: 50.0000 mg | ORAL_TABLET | Freq: Every day | ORAL | 0 refills | Status: AC

## 2023-09-19 NOTE — TOC Progression Note (Signed)
 Transition of Care [facility]) - Progression Note    Patient Details  Name: Stephen [name] MRN: [id number] Date of Birth: 10-02-1935  Transition of Care [facility] CM/SW Contact  Mcarthur Saddie Kim, [place] Phone Number: 09/19/2023, 10:55 AM  Clinical Narrative:  LCSW spoke with pt's POA, [name] regarding d/c plan. He accepts bed at [facility]. Facility notified, but cannot accept until pt is 48 hours chemical/physical restraint free. Pt was given PRN Xanax  this morning. MD/RN updated. TOC will follow.      Expected Discharge Plan: Skilled Nursing Facility Barriers to Discharge: Continued Medical Work up               Expected Discharge Plan and Services In-house Referral: Clinical Social Work Discharge Planning Services: CM Consult Post Acute Care Choice: Skilled Nursing Facility Living arrangements for the past 2 months: Single Family Home Expected Discharge Date: 09/19/23                                     Social Drivers of Health (SDOH) Interventions SDOH Screenings   Food Insecurity: Patient Unable To Answer (09/15/2023)  Housing: Unknown (09/15/2023)  Transportation Needs: Patient Unable To Answer (09/15/2023)  Utilities: Patient Unable To Answer (09/15/2023)  Depression (PHQ2-9): Low Risk  (10/29/2021)  Financial Resource Strain: Low Risk  (10/29/2021)  Social Connections: Unknown (09/15/2023)  Stress: No Stress Concern Present (10/29/2021)  Tobacco Use: Low Risk  (11/24/2021)    Readmission Risk Interventions    09/17/2023    2:56 PM  Readmission Risk Prevention Plan  Transportation Screening Complete  PCP or Specialist Appt within 3-5 Days Complete  HRI or Home Care Consult Complete  Social Work Consult for Recovery Care Planning/Counseling Complete  Palliative Care Screening Not Applicable  Medication Review Oceanographer) Complete

## 2023-09-19 NOTE — Discharge Summary (Signed)
 Physician Discharge Summary   Patient: Stephen [name] MRN: [id number] DOB: [birth year]  Admit date:     09/15/2023  Discharge date: 09/21/23  Discharge Physician: Afton Louder   PCP: Shona Norleen PEDLAR, MD   Discharge to SNF   Recommendations at discharge:   Follow-up with the PCP in 1-2 weeks Follow-up with neurology for further evaluation and assessment for noted short and long-term memory loss Continue to advance diet as tolerated, fall precaution, PT OT Fall precautions recommended  Monitor blood glucose and manage per facility protocol  Current medication need to be reviewed over time with modification  Discharge Diagnoses: Principal Problem:   Sepsis (HCC) Active Problems:   Mixed hyperlipidemia   Essential hypertension   Glaucoma, both eyes   Stage 2 chronic kidney disease   Carotid artery stenosis   Chronic constipation   Type 2 diabetes mellitus (HCC)   Rhabdomyolysis   Lactic acidosis   Transaminitis   Elevated troponin   Falls   Acute metabolic encephalopathy   Bacteremia  Resolved Problems:   * No resolved hospital problems. *  Hospital Course: Stephen [name] is a 88 year old male with extensive history of constipation, glaucoma, HTN, HLD, DM2, BPH, CKD, GERD, TIA,... Presented to ED after patient was found down by family at home.  Per family patient was found facedown on the tile floor outside the bathroom covered in liquid stool.  Patient  was found awake alert oriented x 2, unable to get up, and hold his weight on his legs.  Visible facial swelling on the left side and bruising on the right cheek.  Was not confirmed, unknown how long patient was down.  Last well-known was Wednesday.  Patient was seen in the ED and was below chief complaint of constipation and impaction 2 days ago. Apparently at home he took a full bottle of magnesium  citrate. At this point patient has no complaints of pain, per family he was complaining of back pain at home. He is noted  for progressive weakness and difficult to ambulate now.   ED evaluation: Blood pressure (!) 148/63, pulse 85, temperature 98.3 F (36.8 C), temperature source Rectal, resp. rate 17, height 5' 8 (1.727 m), weight 69.9 kg, SpO2 93%.  Labs: Glucose 140, BUN 37, creatinine 1.31, anion gap 19, creatinine 2.9, AST 334, ALT 75, total bilirubin 1.7, GFR 53, total CK 7000 422, troponin 113, 19, lactic acid 3, 2.3, WBC 19.8,Respiratory panel including;  RSV, influenza A/B COVID all negative, UA large hemoglobin, ketones, negative for bacteria, WBC 0-5, chest x-ray clean, CT head, chest, cervical spine, maxillofacial area follow-up reviewed negative for any acute traumatic findings.  Right renal calculi, the largest 7 mm, but no evidence of passing stone or hydroureteronephrosis. 4. Liquid stool in the colon but no evidence of obstruction or colitis. 5. Lumbar degenerative changes. Potential for at least moderate spinal stenosis at L2-3 and L3-4. ------------------------------------------------------------------------------------------------------------------ Hospital Course by listed problems addressed  Sepsis -- RULED OUT  - POA met SIRS criteria, with leukocytosis: WBC 19.8, creatinine 1.31, lactic acid 3.0,  - No source of infection, UA negative chest x-ray clean  - Presentation due to diarrhea, dehydration, lactic acidosis   Positive Blood Culture - Blood cultures from admission 09/15/2023 growing staph Caprae  - this is highly suspected to be a skin contaminant - Repeat blood cultures -no growth to date  - remains hemodynamically stable   Acute metabolic encephalopathy - RESOLVED - Metabolic encephalopathy with confusion resolving with supportive care  -  Improved mentation awake alert oriented x 3 CT head negative, MRI of the brain reviewed in detail-negative for any acute changes chronic microvascular changes   -History of TIA, continue aspirin , with holding statins due to transaminitis,  rhabdomyolysis   Falls Generalized weakness, status post fall -Severe debility and need for SNF documented -Per POA needs assist with all ADLs and   - PT OT recommending SNF    - S/p evaluation aside from superficial bruising no signs of traumatic laceration open wound - CT head cervical spine chest abdomen all reviewed, negative for any acute traumatic findings   CT head, chest, cervical spine, maxillofacial area follow-up reviewed negative for any acute traumatic findings.  Right renal calculi, the largest 7 mm, but no evidence of passing stone or hydroureteronephrosis. 4. Liquid stool in the colon but no evidence of obstruction or colitis. 5. Lumbar degenerative changes. Potential for at least moderate spinal stenosis at L2-3 and L3-4.   - Fall precautions recommended  - MRI of the brain-reviewed, chronic microvascular changes   Elevated troponin - Pt denies chest pain - Elevated troponin with elevated total CK likely ischemic demand -No acute changes in EKG - Troponin trending down - ACS ruled out  - As needed nitroglycerin, aspirin , analgesics   Transaminitis - Monitored LFTs and trending down - hepatitis panel negative for acute findings - Avoiding hepatotoxins   Lactic acidosis - Lactic acid 3.0>> 2.3 WBC 19.8 >> 12.6 - Afebrile hypertensive - Blood cultures are growing gram-positive cocci - treated with IV fluid hydration - likely due to severe dehydration    - UA-negative, chest x-ray within normal limits-no source of infection     Rhabdomyolysis - RESOLVING Due to poor wound mobility was found down and severe dehydration  - Total CK elevated to 7,422>>down to 701 with treatments  - With AKI with a creatinine of 1.31 - now resolved  -S/p aggressive IV fluid resuscitation   Type 2 diabetes mellitus - checking CBG q. ACH, SSI coverage - resumed home treatment at discharge CBG (last 3)  Recent Labs (last 2 labs)       Recent Labs    09/19/23 1602  09/19/23 2206 09/20/23 0722  GLUCAP 166* 121* 145*        Chronic constipation C/o loose stool-diarrhea -No further diarrhea episode overnight - Status post ingestion of possible multiple bottles of mag citrate at home -Withholding bowel regimen -Monitoring closely   Carotid artery stenosis - Denies any chest pain - Continue aspirin , holding statins (due to transaminitis)   Stage 2 chronic kidney disease Acute on chronic stage II CKD - BUN 37, GFR 53 - Continue IV fluid resuscitation -Avoid nephrotoxin, hypotension   Recent Labs       Lab Results  Component Value Date    CREATININE 0.95 09/20/2023    CREATININE 1.02 09/18/2023    CREATININE 1.12 09/17/2023      Glaucoma, both eyes Will continue eyedrop regimen including timolol , high-dose vitamin E    Essential hypertension - Resuming home medication of Norvasc  and metoprolol  - as needed hydralazine   Mixed hyperlipidemia - temporarily held home medication of Crestor  due to transaminitis, and rhabdomyolysis  Procedures performed: CT of the head/MRI of the brain, 2D echocardiogram Disposition: Skilled nursing facility Diet recommendation:  Discharge Diet Orders (From admission, onward)     Start     Ordered   09/19/23 0000  Diet - low sodium heart healthy        09/19/23 1025  Cardiac and Carb modified diet DISCHARGE MEDICATION: Allergies as of 09/21/2023       Reactions   Benicar Hct [olmesartan Medoxomil-hctz] Other (See Comments)   Renal damage   Benicar [olmesartan] Other (See Comments)   Unknown    Clarithromycin  Other (See Comments)   Unknown    Crestor  [rosuvastatin ] Other (See Comments)   Kidney problems   Doxycycline Other (See Comments)   Unknown   Hydralazine Other (See Comments)   Unknown    Levofloxacin Other (See Comments)   Severe headache   Levofloxacin Other (See Comments)   Unknown    Plavix  [clopidogrel ] Other (See Comments)   Caused confusion and constipation    Propoxyphene Other (See Comments)   With Acetaminophen , severe headache and trouble urinating   Tetracyclines & Related Other (See Comments)   Severe headache   Celecoxib Other (See Comments)   Confusion    Clarithromycin  Rash   Bad Headache   Combigan [brimonidine Tartrate-timolol ] Other (See Comments)   Eye irritation    Dorzolamide Other (See Comments)   Allergic reaction   Latanoprost Other (See Comments)   Allergic reaction   Metronidazole Other (See Comments)   Headache        Medication List     PAUSE taking these medications    rosuvastatin  5 MG tablet Wait to take this until: September 25, 2023 Commonly known as: CRESTOR  Take 5 mg by mouth daily.       STOP taking these medications    Alpha-Lipoic Acid 300 MG Caps   Berberine Chloride Powd   BLACK CHERRY CONCENTRATE PO   Triple Omega-3-6-9 Caps   Vitamin C  500 MG Caps       TAKE these medications    AMBULATORY NON FORMULARY MEDICATION Healthy Prostate  Take 1 tablet by mouth twice daily   amLODipine  10 MG tablet Commonly known as: NORVASC  Take 1 tablet (10 mg total) by mouth every evening.   aspirin  325 MG tablet Take 325 mg by mouth daily.   Co Q 10 100 MG Caps Take 1 capsule by mouth 2 (two) times daily.   docusate sodium  100 MG capsule Commonly known as: COLACE Take 1 capsule (100 mg total) by mouth 2 (two) times daily. What changed: when to take this   insulin  aspart 100 UNIT/ML injection Commonly known as: novoLOG  Inject 0-6 Units into the skin 3 (three) times daily with meals. CBG 70 - 120: 0 units CBG 121 - 150: 0 units CBG 151 - 200: 1 unit CBG 201-250: 2 units CBG 251-300: 3 units CBG 301-350: 4 units CBG 351-400: 5 units CBG > 400: Give 6 units and call MD   lactobacillus Pack Take 1 packet (1 g total) by mouth 3 (three) times daily with meals for 5 days.   Magnesium  500 MG Tabs Take 500 mg by mouth in the morning and at bedtime.   melatonin 3 MG Tabs tablet Take 2  tablets (6 mg total) by mouth at bedtime.   metoprolol  tartrate 50 MG tablet Commonly known as: LOPRESSOR  Take 1 tablet (50 mg total) by mouth 2 (two) times daily.   multivitamin with minerals Tabs tablet Take 1 tablet by mouth daily.   niacin  1000 MG CR tablet Commonly known as: NIASPAN  Take 1,000 mg by mouth at bedtime.   timolol  0.25 % ophthalmic solution Commonly known as: TIMOPTIC  Place 1 drop into both eyes 2 (two) times daily.   traZODone  50 MG tablet Commonly known as: DESYREL  Take 1 tablet (  50 mg total) by mouth at bedtime.   Vitamin B12 3000 MCG/ML Liqd Place 1 tablet under the tongue daily.   Vitamin D3 125 MCG (5000 UT) Tabs Take 1 tablet by mouth daily.   vitamin E  180 MG (400 UNITS) capsule Take 400 Units by mouth daily.   Vyzulta  0.024 % Soln Generic drug: Latanoprostene Bunod  Place 1 drop into both eyes at bedtime.        Contact information for after-discharge care     Destination     [facility] .   Service: Skilled Nursing Contact information: [address] [place]  [address] [phone]                    Discharge Exam: Fredricka Weights   09/18/23 0500 09/19/23 0500 09/20/23 0524  Weight: 77.6 kg 75 kg 73.9 kg        General:  AAO x 2,  cooperative, no distress;   HEENT:  Normocephalic, PERRL, otherwise with in Normal limits   Neuro:  CNII-XII intact. , normal motor and sensation, reflexes intact   Lungs:   Clear to auscultation BL, Respirations unlabored,  No wheezes / crackles  Cardio:    S1/S2, RRR, No murmure, No Rubs or Gallops   Abdomen:  Soft, non-tender, bowel sounds active all four quadrants, no guarding or peritoneal signs.  Muscular  skeletal:  Limited exam -global generalized weaknesses - in bed, able to move all 4 extremities,   2+ pulses,  symmetric, No pitting edema  Skin:  Dry, warm to touch, negative for any Rashes,  Wounds: Please see nursing documentation      Condition at discharge: fair, STABLE   The results of significant diagnostics from this hospitalization (including imaging, microbiology, ancillary and laboratory) are listed below for reference.   Imaging Studies: ECHOCARDIOGRAM COMPLETE Result Date: 09/18/2023    ECHOCARDIOGRAM REPORT   Patient Name:   Stephen [name] Date of Exam: 09/18/2023 Medical Rec #:  [id number]        Height:       68.0 in Accession #:    [id number]       Weight:       171.1 lb Date of Birth:  04-19-1935         BSA:          1.912 m Patient Age:    88 years         BP:           171/73 mmHg Patient Gender: M                HR:           82 bpm. Exam Location:  [facility] Procedure: 2D Echo, Cardiac Doppler and Color Doppler (Both Spectral and Color            Flow Doppler were utilized during procedure). Indications:    Bacteremia R78.81  History:        Patient has prior history of Echocardiogram examinations, most                 recent 07/02/2013. CAD, TIA; Risk Factors:Hypertension, Diabetes                 and Dyslipidemia.  Sonographer:    Aida Pizza RCS Referring Phys: [phone] Dorlisa Savino A Kischa Altice IMPRESSIONS  1. Left ventricular ejection fraction, by estimation, is >75%. The left ventricle has hyperdynamic function. The left ventricle has no regional  wall motion abnormalities. Left ventricular diastolic parameters are indeterminate.  2. Right ventricular systolic function is normal. The right ventricular size is normal.  3. Left atrial size was mildly dilated.  4. The mitral valve is normal in structure. No evidence of mitral valve regurgitation. No evidence of mitral stenosis.  5. The aortic valve is tricuspid. Aortic valve regurgitation is mild. No aortic stenosis is present.  6. The inferior vena cava is normal in size with greater than 50% respiratory variability, suggesting right atrial pressure of 3 mmHg. FINDINGS  Left Ventricle: Left ventricular ejection fraction, by estimation, is >75%. The left ventricle has  hyperdynamic function. The left ventricle has no regional wall motion abnormalities. The left ventricular internal cavity size was normal in size. There is no left ventricular hypertrophy. Left ventricular diastolic parameters are indeterminate. Right Ventricle: The right ventricular size is normal. Right vetricular wall thickness was not well visualized. Right ventricular systolic function is normal. Left Atrium: Left atrial size was mildly dilated. Right Atrium: Right atrial size was normal in size. Pericardium: There is no evidence of pericardial effusion. Mitral Valve: The mitral valve is normal in structure. No evidence of mitral valve regurgitation. No evidence of mitral valve stenosis. Tricuspid Valve: The tricuspid valve is normal in structure. Tricuspid valve regurgitation is not demonstrated. No evidence of tricuspid stenosis. Aortic Valve: The aortic valve is tricuspid. Aortic valve regurgitation is mild. No aortic stenosis is present. Aortic valve mean gradient measures 6.0 mmHg. Aortic valve peak gradient measures 13.1 mmHg. Aortic valve area, by VTI measures 2.31 cm. Pulmonic Valve: The pulmonic valve was not well visualized. Pulmonic valve regurgitation is not visualized. No evidence of pulmonic stenosis. Aorta: The aortic root is normal in size and structure and the ascending aorta was not well visualized. Venous: The inferior vena cava is normal in size with greater than 50% respiratory variability, suggesting right atrial pressure of 3 mmHg. IAS/Shunts: No atrial level shunt detected by color flow Doppler.  LEFT VENTRICLE PLAX 2D LVIDd:         3.90 cm   Diastology LVIDs:         1.90 cm   LV e' medial:    8.70 cm/s LV PW:         1.00 cm   LV E/e' medial:  14.9 LV IVS:        1.00 cm   LV e' lateral:   10.80 cm/s LVOT diam:     1.90 cm   LV E/e' lateral: 12.0 LV SV:         82 LV SV Index:   43 LVOT Area:     2.84 cm  RIGHT VENTRICLE RV S prime:     18.52 cm/s TAPSE (M-mode): 3.2 cm LEFT ATRIUM              Index        RIGHT ATRIUM           Index LA diam:        3.40 cm 1.78 cm/m   RA Area:     17.70 cm LA Vol (A2C):   93.6 ml 48.94 ml/m  RA Volume:   45.60 ml  23.84 ml/m LA Vol (A4C):   53.1 ml 27.77 ml/m LA Biplane Vol: 73.8 ml 38.59 ml/m  AORTIC VALVE AV Area (Vmax):    2.12 cm AV Area (Vmean):   2.29 cm AV Area (VTI):     2.31 cm AV Vmax:  181.00 cm/s AV Vmean:          107.000 cm/s AV VTI:            0.356 m AV Peak Grad:      13.1 mmHg AV Mean Grad:      6.0 mmHg LVOT Vmax:         135.50 cm/s LVOT Vmean:        86.400 cm/s LVOT VTI:          0.290 m LVOT/AV VTI ratio: 0.82  AORTA Ao Root diam: 3.80 cm MITRAL VALVE MV Area (PHT): 3.12 cm     SHUNTS MV Decel Time: 243 msec     Systemic VTI:  0.29 m MV E velocity: 130.00 cm/s  Systemic Diam: 1.90 cm MV A velocity: 134.00 cm/s MV E/A ratio:  0.97 Dorn Ross MD Electronically signed by Dorn Ross MD Signature Date/Time: 09/18/2023/6:22:01 PM    Final    MR BRAIN WO CONTRAST Result Date: 09/16/2023 CLINICAL DATA:  88 year old male with altered mental status. Found down. EXAM: MRI HEAD WITHOUT CONTRAST TECHNIQUE: Multiplanar, multiecho pulse sequences of the brain and surrounding structures were obtained without intravenous contrast. COMPARISON:  CT head, face, cervical spine the same day reported separately. Brain MRI 11/12/2019. FINDINGS: Brain: Stable cerebral volume from the 2021 MRI. No restricted diffusion to suggest acute infarction. No midline shift, mass effect, evidence of mass lesion, ventriculomegaly, extra-axial collection or acute intracranial hemorrhage. Cervicomedullary junction and pituitary are within normal limits. Patchy and confluent cerebral white matter T2 and FLAIR hyperintensity with pronounced chronic involvement of the right corona radiata, extending to subcortical white matter there (series 16, image 18). Acute corona radiata lacunar infarct was present in in this region on 2021 exam. The regional  white matter T2 and FLAIR hyperintensity has progressed since that time. No cortical encephalomalacia identified. No convincing chronic cerebral blood products on SWI. Outside of the right corona radiata white matter signal changes are stable since 2021 and mild for age. Mild for age T2 heterogeneity in the bilateral basal ganglia. Thalami, brainstem, cerebellum appear negative. Vascular: Major intracranial vascular flow voids are preserved, stable since 2021. Skull and upper cervical spine: Visualized bone marrow signal is within normal limits. Partially visible chronic cervical spine degeneration. Sinuses/Orbits: Stable since 2021, negative. Other: Mild chronic right mastoid effusion. Otherwise grossly normal visible internal auditory structures. Negative visible scalp and face. IMPRESSION: 1. No acute intracranial abnormality. 2. Pronounced chronic small-vessel ischemia in the right corona radiata and regional subcortical white matter. Progression there since a 2021 MRI. But mild for age small-vessel disease changes otherwise. Electronically Signed   By: VEAR Hurst M.D.   On: 09/16/2023 06:44   DG Shoulder Left Result Date: 09/15/2023 CLINICAL DATA:  Fall EXAM: LEFT SHOULDER - 2+ VIEW COMPARISON:  None available FINDINGS: Irregularity of the greater tuberosity indicative of rotator cuff tendinopathy. Mild degenerative changes of the glenohumeral joint. No fracture or dislocation. IMPRESSION: No acute abnormality of the LEFT shoulder. Electronically Signed   By: Aliene Lloyd M.D.   On: 09/15/2023 12:35   CT Head Wo Contrast Result Date: 09/15/2023 CLINICAL DATA:  Found on the floor.  Altered mental status. EXAM: CT HEAD WITHOUT CONTRAST CT MAXILLOFACIAL WITHOUT CONTRAST CT CERVICAL SPINE WITHOUT CONTRAST TECHNIQUE: Multidetector CT imaging of the head, cervical spine, and maxillofacial structures were performed using the standard protocol without intravenous contrast. Multiplanar CT image reconstructions of the  cervical spine and maxillofacial structures were also generated. RADIATION DOSE REDUCTION:  This exam was performed according to the departmental dose-optimization program which includes automated exposure control, adjustment of the mA and/or kV according to patient size and/or use of iterative reconstruction technique. COMPARISON:  11/12/2019 FINDINGS: CT HEAD FINDINGS Brain: Age related volume loss. Chronic small-vessel ischemic changes of the white matter. No sign of acute infarction, mass lesion, hemorrhage, hydrocephalus or extra-axial collection. Vascular: There is atherosclerotic calcification of the major vessels at the base of the brain. Skull: Negative Other: None CT MAXILLOFACIAL FINDINGS Osseous: No facial fracture. Orbits: No intraorbital injury. Sinuses: Sinuses are clear. Soft tissues: Mild soft tissue swelling of the face. CT CERVICAL SPINE FINDINGS Alignment: No malalignment. Skull base and vertebrae: No regional fracture. Solid bridging anterior osteophytes from C5 through C7. Soft tissues and spinal canal: No traumatic soft tissue finding. 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 Mild osteoarthritis at the C1-2 articulation. C2-3: Facet osteoarthritis on the right. Moderate right foraminal stenosis. C3-4: Endplate and uncovertebral osteophytes more prominent on the left. Bilateral facet degeneration worse on the left. Left foraminal stenosis that could cause neural compression. C4-5: Endplate and uncovertebral osteophytes worse on the right. Facet degeneration worse on the right. Right foraminal stenosis that could cause neural compression. C5 through C7: Fusion of the vertebral bodies. No compressive canal stenosis. Mild bilateral chronic bony foraminal narrowing. C7-T1: Anterior osteophytes but without complete solid union. No stenosis of the canal or foramina. Upper chest: Negative Other: IMPRESSION: HEAD CT: No acute or traumatic  finding. Age related volume loss and chronic small-vessel ischemic changes of the white matter. MAXILLOFACIAL CT: No facial fracture. Mild soft tissue swelling of the face. CERVICAL SPINE CT: No acute or traumatic finding. Chronic degenerative changes as outlined above. Electronically Signed   By: Oneil Officer M.D.   On: 09/15/2023 11:25   CT Cervical Spine Wo Contrast Result Date: 09/15/2023 CLINICAL DATA:  Found on the floor.  Altered mental status. EXAM: CT HEAD WITHOUT CONTRAST CT MAXILLOFACIAL WITHOUT CONTRAST CT CERVICAL SPINE WITHOUT CONTRAST TECHNIQUE: Multidetector CT imaging of the head, cervical spine, and maxillofacial structures were performed using the standard protocol without intravenous contrast. Multiplanar CT image reconstructions of the cervical spine and maxillofacial structures were also generated. RADIATION DOSE REDUCTION: This exam was performed according to the departmental dose-optimization program which includes automated exposure control, adjustment of the mA and/or kV according to patient size and/or use of iterative reconstruction technique. COMPARISON:  11/12/2019 FINDINGS: CT HEAD FINDINGS Brain: Age related volume loss. Chronic small-vessel ischemic changes of the white matter. No sign of acute infarction, mass lesion, hemorrhage, hydrocephalus or extra-axial collection. Vascular: There is atherosclerotic calcification of the major vessels at the base of the brain. Skull: Negative Other: None CT MAXILLOFACIAL FINDINGS Osseous: No facial fracture. Orbits: No intraorbital injury. Sinuses: Sinuses are clear. Soft tissues: Mild soft tissue swelling of the face. CT CERVICAL SPINE FINDINGS Alignment: No malalignment. Skull base and vertebrae: No regional fracture. Solid bridging anterior osteophytes from C5 through C7. Soft tissues and spinal canal: No traumatic soft tissue finding. 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 Mild  osteoarthritis at the C1-2 articulation. C2-3: Facet osteoarthritis on the right. Moderate right foraminal stenosis. C3-4: Endplate and uncovertebral osteophytes more prominent on the left. Bilateral facet degeneration worse on the left. Left foraminal stenosis that could cause neural compression. C4-5: Endplate and uncovertebral osteophytes worse on the right. Facet degeneration worse  on the right. Right foraminal stenosis that could cause neural compression. C5 through C7: Fusion of the vertebral bodies. No compressive canal stenosis. Mild bilateral chronic bony foraminal narrowing. C7-T1: Anterior osteophytes but without complete solid union. No stenosis of the canal or foramina. Upper chest: Negative Other: IMPRESSION: HEAD CT: No acute or traumatic finding. Age related volume loss and chronic small-vessel ischemic changes of the white matter. MAXILLOFACIAL CT: No facial fracture. Mild soft tissue swelling of the face. CERVICAL SPINE CT: No acute or traumatic finding. Chronic degenerative changes as outlined above. Electronically Signed   By: Oneil Officer M.D.   On: 09/15/2023 11:25   CT Maxillofacial WO CM Result Date: 09/15/2023 CLINICAL DATA:  Found on the floor.  Altered mental status. EXAM: CT HEAD WITHOUT CONTRAST CT MAXILLOFACIAL WITHOUT CONTRAST CT CERVICAL SPINE WITHOUT CONTRAST TECHNIQUE: Multidetector CT imaging of the head, cervical spine, and maxillofacial structures were performed using the standard protocol without intravenous contrast. Multiplanar CT image reconstructions of the cervical spine and maxillofacial structures were also generated. RADIATION DOSE REDUCTION: This exam was performed according to the departmental dose-optimization program which includes automated exposure control, adjustment of the mA and/or kV according to patient size and/or use of iterative reconstruction technique. COMPARISON:  11/12/2019 FINDINGS: CT HEAD FINDINGS Brain: Age related volume loss. Chronic  small-vessel ischemic changes of the white matter. No sign of acute infarction, mass lesion, hemorrhage, hydrocephalus or extra-axial collection. Vascular: There is atherosclerotic calcification of the major vessels at the base of the brain. Skull: Negative Other: None CT MAXILLOFACIAL FINDINGS Osseous: No facial fracture. Orbits: No intraorbital injury. Sinuses: Sinuses are clear. Soft tissues: Mild soft tissue swelling of the face. CT CERVICAL SPINE FINDINGS Alignment: No malalignment. Skull base and vertebrae: No regional fracture. Solid bridging anterior osteophytes from C5 through C7. Soft tissues and spinal canal: No traumatic soft tissue finding. 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 Mild osteoarthritis at the C1-2 articulation. C2-3: Facet osteoarthritis on the right. Moderate right foraminal stenosis. C3-4: Endplate and uncovertebral osteophytes more prominent on the left. Bilateral facet degeneration worse on the left. Left foraminal stenosis that could cause neural compression. C4-5: Endplate and uncovertebral osteophytes worse on the right. Facet degeneration worse on the right. Right foraminal stenosis that could cause neural compression. C5 through C7: Fusion of the vertebral bodies. No compressive canal stenosis. Mild bilateral chronic bony foraminal narrowing. C7-T1: Anterior osteophytes but without complete solid union. No stenosis of the canal or foramina. Upper chest: Negative Other: IMPRESSION: HEAD CT: No acute or traumatic finding. Age related volume loss and chronic small-vessel ischemic changes of the white matter. MAXILLOFACIAL CT: No facial fracture. Mild soft tissue swelling of the face. CERVICAL SPINE CT: No acute or traumatic finding. Chronic degenerative changes as outlined above. Electronically Signed   By: Oneil Officer M.D.   On: 09/15/2023 11:25   CT CHEST ABDOMEN PELVIS W CONTRAST Result Date: 09/15/2023 CLINICAL DATA:   Found on the floor. Loss of bowel control. Left-sided facial swelling. EXAM: CT CHEST, ABDOMEN, AND PELVIS WITH CONTRAST TECHNIQUE: Multidetector CT imaging of the chest, abdomen and pelvis was performed following the standard protocol during bolus administration of intravenous contrast. RADIATION DOSE REDUCTION: This exam was performed according to the departmental dose-optimization program which includes automated exposure control, adjustment of the mA and/or kV according to patient size and/or use of iterative reconstruction technique. CONTRAST:  OMNIPAQUE  IOHEXOL  300 MG/ML  SOLN COMPARISON:  None Available. FINDINGS: CT CHEST FINDINGS Cardiovascular: Normal heart size. No pericardial fluid. Some coronary artery calcification is present. Some aortic atherosclerotic calcification is present. No evidence of aneurysm. Mediastinum/Nodes: No mass or adenopathy. Lungs/Pleura: No pleural disease. No underlying emphysema. No mass. No nodule in need of further follow-up. Benign 2 mm subpleural nodule in the posterior right upper lobe. Musculoskeletal: No regional fracture. Chronic spinal degenerative changes with bridging osteophytes. CT ABDOMEN PELVIS FINDINGS Hepatobiliary: Normal Pancreas: Normal Spleen: Normal Adrenals/Urinary Tract: Adrenal glands are normal. Mild atrophic change of the left kidney without focal finding or obstruction. Right kidney is normal size and contains multiple stones, the largest 7 mm, but no evidence of passing stone or hydroureteronephrosis. Bladder appears normal. Stomach/Bowel: Stomach and small intestine are unremarkable. Normal appendix. Liquid stool in the colon but no evidence of obstruction or colitis. Vascular/Lymphatic: No lymphadenopathy. Aortic atherosclerosis without aneurysm. Reproductive: Normal Other: No free fluid or air. Musculoskeletal: Lumbar degenerative changes. Potential for at least moderate spinal stenosis at L2-3 and L3-4. IMPRESSION: 1. No acute or traumatic  finding. 2. Aortic atherosclerosis. Coronary artery calcification. 3. Right renal calculi, the largest 7 mm, but no evidence of passing stone or hydroureteronephrosis. 4. Liquid stool in the colon but no evidence of obstruction or colitis. 5. Lumbar degenerative changes. Potential for at least moderate spinal stenosis at L2-3 and L3-4. Aortic Atherosclerosis (ICD10-I70.0). Electronically Signed   By: Oneil Officer M.D.   On: 09/15/2023 11:20   DG Abdomen 1 View Result Date: 09/13/2023 CLINICAL DATA:  Constipation/fecal impaction. EXAM: ABDOMEN - 1 VIEW COMPARISON:  10/22/2021. FINDINGS: The bowel gas pattern is non-obstructive.  No abnormal stool burden. No evidence of pneumoperitoneum. No acute osseous abnormalities. The soft tissues are within normal limits. Surgical changes, devices, tubes and lines: None. IMPRESSION: Nonobstructive bowel gas pattern. No abnormal stool burden. Electronically Signed   By: Ree Molt M.D.   On: 09/13/2023 17:44    Microbiology: Results for orders placed or performed during the hospital encounter of 09/15/23  Culture, blood (routine x 2)     Status: Abnormal   Collection Time: 09/15/23  9:05 AM   Specimen: BLOOD  Result Value Ref Range Status   Specimen Description   Final    BLOOD LEFT ARM Performed at [facility], [address], [place], [place] [address]    Special Requests   Final    BOTTLES DRAWN AEROBIC AND ANAEROBIC Blood Culture adequate volume Performed at [facility], [address] [place], [place] [address]    Culture  Setup Time   Final    GRAM POSITIVE COCCI AEROBIC BOTTLE ONLY Gram Stain Report Called to,Read Back By and Verified With: ASHLEY S. ON 09/16/2023 @10 :28 BY T. HAMER  CRITICAL RESULT CALLED TO, READ BACK BY AND VERIFIED WITH: RN FREDRIK BERRY [id number] AT 1837 BY EC    Culture (A)  Final    STAPHYLOCOCCUS CAPRAE THE SIGNIFICANCE OF ISOLATING THIS ORGANISM FROM A SINGLE SET OF BLOOD CULTURES WHEN MULTIPLE SETS ARE DRAWN IS  UNCERTAIN. PLEASE NOTIFY THE MICROBIOLOGY DEPARTMENT WITHIN ONE WEEK IF SPECIATION AND SENSITIVITIES ARE REQUIRED. Performed at [facility], [address] [place], [place] [address]    Report Status 09/18/2023 FINAL  Final  Blood Culture ID Panel (Reflexed)     Status: Abnormal   Collection Time: 09/15/23  9:05 AM  Result Value Ref Range Status   Enterococcus faecalis NOT DETECTED NOT DETECTED Final   Enterococcus Faecium NOT DETECTED NOT DETECTED Final  Listeria monocytogenes NOT DETECTED NOT DETECTED Final   Staphylococcus species DETECTED (A) NOT DETECTED Final    Comment: CRITICAL RESULT CALLED TO, READ BACK BY AND VERIFIED WITH: RN FREDRIK BERRY [id number] AT 1837 BY EC    Staphylococcus aureus (BCID) NOT DETECTED NOT DETECTED Final   Staphylococcus epidermidis NOT DETECTED NOT DETECTED Final   Staphylococcus lugdunensis NOT DETECTED NOT DETECTED Final   Streptococcus species NOT DETECTED NOT DETECTED Final   Streptococcus agalactiae NOT DETECTED NOT DETECTED Final   Streptococcus pneumoniae NOT DETECTED NOT DETECTED Final   Streptococcus pyogenes NOT DETECTED NOT DETECTED Final   A.calcoaceticus-baumannii NOT DETECTED NOT DETECTED Final   Bacteroides fragilis NOT DETECTED NOT DETECTED Final   Enterobacterales NOT DETECTED NOT DETECTED Final   Enterobacter cloacae complex NOT DETECTED NOT DETECTED Final   Escherichia coli NOT DETECTED NOT DETECTED Final   Klebsiella aerogenes NOT DETECTED NOT DETECTED Final   Klebsiella oxytoca NOT DETECTED NOT DETECTED Final   Klebsiella pneumoniae NOT DETECTED NOT DETECTED Final   Proteus species NOT DETECTED NOT DETECTED Final   Salmonella species NOT DETECTED NOT DETECTED Final   Serratia marcescens NOT DETECTED NOT DETECTED Final   Haemophilus influenzae NOT DETECTED NOT DETECTED Final   Neisseria meningitidis NOT DETECTED NOT DETECTED Final   Pseudomonas aeruginosa NOT DETECTED NOT DETECTED Final   Stenotrophomonas maltophilia NOT  DETECTED NOT DETECTED Final   Candida albicans NOT DETECTED NOT DETECTED Final   Candida auris NOT DETECTED NOT DETECTED Final   Candida glabrata NOT DETECTED NOT DETECTED Final   Candida krusei NOT DETECTED NOT DETECTED Final   Candida parapsilosis NOT DETECTED NOT DETECTED Final   Candida tropicalis NOT DETECTED NOT DETECTED Final   Cryptococcus neoformans/gattii NOT DETECTED NOT DETECTED Final    Comment: Performed at [facility], [address], [place], [place] [address]  Culture, blood (routine x 2)     Status: None   Collection Time: 09/15/23  9:58 AM   Specimen: BLOOD  Result Value Ref Range Status   Specimen Description BLOOD BLOOD RIGHT HAND  Final   Special Requests   Final    BOTTLES DRAWN AEROBIC AND ANAEROBIC Blood Culture adequate volume   Culture   Final    NO GROWTH 5 DAYS Performed at [facility], [address], [place], [place] [address]    Report Status 09/20/2023 FINAL  Final  Resp panel by RT-PCR (RSV, Flu A&B, Covid) Anterior Nasal Swab     Status: None   Collection Time: 09/15/23 11:17 AM   Specimen: Anterior Nasal Swab  Result Value Ref Range Status   SARS Coronavirus 2 by RT PCR NEGATIVE NEGATIVE Final    Comment: (NOTE) SARS-CoV-2 target nucleic acids are NOT DETECTED.  The SARS-CoV-2 RNA is generally detectable in upper respiratory specimens during the acute phase of infection. The lowest concentration of SARS-CoV-2 viral copies this assay can detect is 138 copies/mL. A negative result does not preclude SARS-Cov-2 infection and should not be used as the sole basis for treatment or other patient management decisions. A negative result may occur with  improper specimen collection/handling, submission of specimen other than nasopharyngeal swab, presence of viral mutation(s) within the areas targeted by this assay, and inadequate number of viral copies(<138 copies/mL). A negative result must be combined with clinical observations, patient  history, and epidemiological information. The expected result is Negative.  Fact Sheet for Patients:  BloggerCourse.com  Fact Sheet for Healthcare Providers:  SeriousBroker.it  This test is no  t yet approved or cleared by the [place]  FDA and  has been authorized for detection and/or diagnosis of SARS-CoV-2 by FDA under an Emergency Use Authorization (EUA). This EUA will remain  in effect (meaning this test can be used) for the duration of the COVID-19 declaration under Section 564(b)(1) of the Act, 21 U.S.C.section 360bbb-3(b)(1), unless the authorization is terminated  or revoked sooner.       Influenza A by PCR NEGATIVE NEGATIVE Final   Influenza B by PCR NEGATIVE NEGATIVE Final    Comment: (NOTE) The Xpert Xpress SARS-CoV-2/FLU/RSV plus assay is intended as an aid in the diagnosis of influenza from Nasopharyngeal swab specimens and should not be used as a sole basis for treatment. Nasal washings and aspirates are unacceptable for Xpert Xpress SARS-CoV-2/FLU/RSV testing.  Fact Sheet for Patients: BloggerCourse.com  Fact Sheet for Healthcare Providers: SeriousBroker.it  This test is not yet approved or cleared by the [place]  FDA and has been authorized for detection and/or diagnosis of SARS-CoV-2 by FDA under an Emergency Use Authorization (EUA). This EUA will remain in effect (meaning this test can be used) for the duration of the COVID-19 declaration under Section 564(b)(1) of the Act, 21 U.S.C. section 360bbb-3(b)(1), unless the authorization is terminated or revoked.     Resp Syncytial Virus by PCR NEGATIVE NEGATIVE Final    Comment: (NOTE) Fact Sheet for Patients: BloggerCourse.com  Fact Sheet for Healthcare Providers: SeriousBroker.it  This test is not yet approved or cleared by the [place]  FDA  and has been authorized for detection and/or diagnosis of SARS-CoV-2 by FDA under an Emergency Use Authorization (EUA). This EUA will remain in effect (meaning this test can be used) for the duration of the COVID-19 declaration under Section 564(b)(1) of the Act, 21 U.S.C. section 360bbb-3(b)(1), unless the authorization is terminated or revoked.  Performed at [facility], [address], [place], [place] [address]   MRSA Next Gen by PCR, Nasal     Status: None   Collection Time: 09/16/23 12:00 AM   Specimen: Nasal Mucosa; Nasal Swab  Result Value Ref Range Status   MRSA by PCR Next Gen NOT DETECTED NOT DETECTED Final    Comment: (NOTE) The GeneXpert MRSA Assay (FDA approved for NASAL specimens only), is one component of a comprehensive MRSA colonization surveillance program. It is not intended to diagnose MRSA infection nor to guide or monitor treatment for MRSA infections. Test performance is not FDA approved in patients less than 78 years old. Performed at [facility], [address], [place], [place] [address]   Culture, blood (Routine X 2) w Reflex to ID Panel     Status: None (Preliminary result)   Collection Time: 09/17/23 12:20 AM   Specimen: BLOOD RIGHT ARM  Result Value Ref Range Status   Specimen Description BLOOD RIGHT ARM  Final   Special Requests   Final    BOTTLES DRAWN AEROBIC AND ANAEROBIC Blood Culture adequate volume   Culture   Final    NO GROWTH 4 DAYS Performed at [facility], [address] [place], [place] [address]    Report Status PENDING  Incomplete  Culture, blood (Routine X 2) w Reflex to ID Panel     Status: None (Preliminary result)   Collection Time: 09/17/23 12:23 AM   Specimen: BLOOD RIGHT HAND  Result Value Ref Range Status   Specimen Description BLOOD RIGHT HAND  Final   Special Requests AEROBIC BOTTLE ONLY Blood Culture adequate volume  Final  Culture   Final    NO GROWTH 4 DAYS Performed at [facility], [address]  [place], [place] [address]    Report Status PENDING  Incomplete    Labs: CBC: Recent Labs  Lab 09/15/23 0905 09/16/23 0912 09/17/23 0023 09/18/23 0443 09/19/23 0413  WBC 19.8* 12.6* 10.2 8.6 9.5  NEUTROABS 16.0*  --   --   --   --   HGB 16.2 14.2 12.7* 13.1 13.9  HCT 46.3 41.0 36.2* 38.1* 40.1  MCV 93.7 94.5 94.3 94.8 95.0  PLT 175 142* 138* 150 169   Basic Metabolic Panel: Recent Labs  Lab 09/15/23 0905 09/15/23 1223 09/16/23 0912 09/17/23 0023 09/18/23 0443 09/20/23 0406  NA 139  --  135 138 138 139  K 4.5  --  3.9 4.1 3.6 3.4*  CL 99  --  101 102 105 103  CO2 21*  --  22 24 23 22   GLUCOSE 140*  --  111* 111* 101* 151*  BUN 37*  --  30* 29* 17 23  CREATININE 1.31*  --  1.09 1.12 1.02 0.95  CALCIUM  9.3  --  8.6* 8.6* 8.5* 8.4*  MG 2.9* 2.8*  --   --   --   --   PHOS  --  4.0  --   --   --   --    Liver Function Tests: Recent Labs  Lab 09/15/23 0905 09/16/23 0912 09/17/23 0023 09/18/23 0443 09/20/23 0406  AST 334* 202* 151* 114* 43*  ALT 75* 70* 68* 68* 51*  ALKPHOS 81 64 57 55 56  BILITOT 1.7* 0.9 0.9 1.0 1.0  PROT 7.3 6.3* 5.6* 5.5* 5.4*  ALBUMIN 3.8 3.2* 2.8* 2.5* 2.6*   CBG: Recent Labs  Lab 09/19/23 2206 09/20/23 0722 09/20/23 1128 09/20/23 1700 09/21/23 0735  GLUCAP 121* 145* 147* 187* 147*    Discharge time spent: greater than 40 minutes.  Signed: Afton Louder, MD [facility] 09/21/2023

## 2023-09-19 NOTE — Plan of Care (Signed)
  Problem: Health Behavior/Discharge Planning: Goal: Ability to manage health-related needs will improve Outcome: Progressing   Problem: Clinical Measurements: Goal: Will remain free from infection Outcome: Progressing Goal: Diagnostic test results will improve Outcome: Progressing   Problem: Coping: Goal: Level of anxiety will decrease Outcome: Progressing   Problem: Elimination: Goal: Will not experience complications related to urinary retention Outcome: Progressing   Problem: Pain Managment: Goal: General experience of comfort will improve and/or be controlled Outcome: Progressing

## 2023-09-19 NOTE — Progress Notes (Signed)
 PT Cancellation Note  Patient Details Name: Stephen [name] MRN: [id number] DOB: December 08, 1935   Cancelled Treatment:    Reason Eval/Treat Not Completed: Fatigue/lethargy limiting ability to participate Attempted to see patient this AM. Patient agreeable but very lethargic. Falls asleep while doing LE exercises. Will attempt treatment at later time.   9:45 AM, 09/19/23 Rosaria Settler, PT, DPT [place] with [facility]

## 2023-09-19 NOTE — Progress Notes (Signed)
 SLP Cancellation Note  Patient Details Name: Stephen [name] MRN: [id number] DOB: November 25, 1935   Cancelled treatment:       Reason Eval/Treat Not Completed: Fatigue/lethargy limiting ability to participate; speech/language assessment attempted, but pt kept falling asleep and requiring mod-max cues to participate.  SLP will continue to f/u for assessment completion during acute stay.  Pt was oriented to self/place, but could not maintain alertness level to complete assessment.   [name],M.S.,CCC-SLP 09/19/2023, 2:01 PM

## 2023-09-19 NOTE — Progress Notes (Signed)
 Occupational Therapy Treatment Patient Details Name: Stephen [name] MRN: [id number] DOB: 1936-01-07 Today's Date: 09/19/2023   History of present illness Stephen [name] is a 88 year old male with extensive history of constipation, glaucoma, HTN, HLD, DM2, BPH, CKD, GERD, TIA,... Presented to ED after patient was found down by family at home.  Per family patient was found facedown on the tile floor outside the bathroom covered in liquid stool.  Patient  was found awake alert oriented x 2, unable to get up, and hold his weight on his legs.  Visible facial swelling on the left side and bruising on the right cheek.  Was not confirmed, unknown how long patient was down.  Last well-known was Wednesday.   OT comments  Pt agreeable to OT treatment, but very lethargic. Pt aroused some once seated at EOB which required max A. Pt able to transfer to chair using RW with mod to max A. Pt subsequently completed x5 reps of sit to stand from the chair with mostly mod A using RW. Cuing needed for anterior lean and to lower self into chair rather than plopping. P/ROM completed for shoulder flexion and abduction with mild grimacing noted with R UE shoulder movement. Pt attempted grooming but required [facility] assist. Lethargy certainly a limiting factor today. Pt left in the chair with chair alarm set and call bell within reach. Pt will benefit from continued OT in the hospital and recommended venue below to increase strength, balance, and endurance for safe ADL's.         If plan is discharge home, recommend the following:  A lot of help with walking and/or transfers;A lot of help with bathing/dressing/bathroom;Assistance with cooking/housework;Direct supervision/assist for medications management;Assist for transportation;Help with stairs or ramp for entrance   Equipment Recommendations  None recommended by OT          Precautions / Restrictions Precautions Precautions: Fall Recall of Precautions/Restrictions:  Impaired Restrictions Weight Bearing Restrictions Per Provider Order: No       Mobility Bed Mobility Overal bed mobility: Needs Assistance Bed Mobility: Supine to Sit     Supine to sit: Max assist, HOB elevated     General bed mobility comments: lethargic; much assist today; minimal active movement of B LE towards the edge of the bed.    Transfers Overall transfer level: Needs assistance Equipment used: Rolling walker (2 wheels) Transfers: Sit to/from Stand, Bed to chair/wheelchair/BSC Sit to Stand: Mod assist, Max assist     Step pivot transfers: Mod assist, Max assist     General transfer comment: Mod to max for sit to stand from EOB. Pt completed x5 reps of sit to stand from chair with more mod A. Mod to max for steps to chair due to assist to manage RW and guide pt to the chair.     Balance Overall balance assessment: Needs assistance Sitting-balance support: Feet supported, No upper extremity supported Sitting balance-Leahy Scale: Poor Sitting balance - Comments: posterior lean; holding onto knees to maintain balance Postural control: Posterior lean Standing balance support: Bilateral upper extremity supported, During functional activity, Reliant on assistive device for balance Standing balance-Leahy Scale: Poor Standing balance comment: poor with RW                           ADL either performed or assessed with clinical judgement   ADL Overall ADL's : Needs assistance/impaired     Grooming: Wash/dry face;Moderate assistance;Maximal assistance;Sitting Grooming Details (indicate cue type  and reason): Pt level of lethargy limiting function today. Pt able to bring washcloth to face but minimally moved it needing hand over hand input to fully groom.         Upper Body Dressing : Moderate assistance;Bed level;Maximal assistance Upper Body Dressing Details (indicate cue type and reason): Pt able to put arms through holes of gown while supine with cuing  and assist to move B UE. Limited by lethragy.   Lower Body Dressing Details (indicate cue type and reason): Attempted briefly by assiting pt to bed his knees while seated in the chair with legs elevated. Pt lethargic and unable to reach B LE to doff socks today.                     Communication Communication Communication: No apparent difficulties   Cognition Arousal: Lethargic Behavior During Therapy: WFL for tasks assessed/performed (very lethargic) Cognition: No family/caregiver present to determine baseline             OT - Cognition Comments: Lethargy limiting cognition today.                 Following commands: Intact        Cueing   Cueing Techniques: Verbal cues, Visual cues, Tactile cues  Exercises Exercises: General Upper Extremity General Exercises - Upper Extremity Shoulder Flexion: Both, 5 reps, Supine, PROM Shoulder ABduction: PROM, Both, 5 reps, Supine                 Pertinent Vitals/ Pain       Pain Assessment Pain Assessment: Faces Faces Pain Scale: No hurt                                                          Frequency  Min 3X/week        Progress Toward Goals  OT Goals(current goals can now be found in the care plan section)  Progress towards OT goals: Progressing toward goals  Acute Rehab OT Goals Patient Stated Goal: improve function OT Goal Formulation: With patient Time For Goal Achievement: 10/02/23 Potential to Achieve Goals: Good ADL Goals Pt Will Perform Grooming: with contact guard assist;standing Pt Will Perform Upper Body Dressing: with modified independence;sitting Pt Will Perform Lower Body Dressing: with modified independence;sitting/lateral leans Pt Will Transfer to Toilet: with modified independence;with supervision;ambulating Pt Will Perform Toileting - Clothing Manipulation and hygiene: with contact guard assist;with supervision;sitting/lateral leans Pt/caregiver will  Perform Home Exercise Program: Increased ROM;Increased strength;Both right and left upper extremity;Independently  Plan                                      End of Session Equipment Utilized During Treatment: Rolling walker (2 wheels);Gait belt  OT Visit Diagnosis: Unsteadiness on feet (R26.81);Other abnormalities of gait and mobility (R26.89);Muscle weakness (generalized) (M62.81);History of falling (Z91.81);Other symptoms and signs involving cognitive function   Activity Tolerance Patient limited by lethargy   Patient Left in chair;with call bell/phone within reach;with chair alarm set   Nurse Communication Other (comment) (notified pt was in the chair with chair alarm set)        Time: 1246-1318 OT Time Calculation (min): 32 min  Charges: OT General Charges $OT  Visit: 1 Visit OT Treatments $Self Care/Home Management : 8-22 mins $Therapeutic Exercise: 8-22 mins  Mckyla Deckman OT, MOT  Jayson Person 09/19/2023, 1:44 PM

## 2023-09-19 NOTE — Plan of Care (Signed)

## 2023-09-19 NOTE — Plan of Care (Signed)
   Problem: Activity: Goal: Risk for activity intolerance will decrease Outcome: Progressing   Problem: Coping: Goal: Level of anxiety will decrease Outcome: Progressing

## 2023-09-19 NOTE — Progress Notes (Signed)
 Pt did not experience any adverse events throughout night, but did express a short brief period of restlessness. Gave prn Ativan . Pt has been relaxed and sleeping. Rise and fall of chest noticed. No physical restraints were implemented.

## 2023-09-20 DIAGNOSIS — M6282 Rhabdomyolysis: Secondary | ICD-10-CM | POA: Diagnosis not present

## 2023-09-20 DIAGNOSIS — G9341 Metabolic encephalopathy: Secondary | ICD-10-CM | POA: Diagnosis not present

## 2023-09-20 DIAGNOSIS — R7989 Other specified abnormal findings of blood chemistry: Secondary | ICD-10-CM | POA: Diagnosis not present

## 2023-09-20 DIAGNOSIS — R7401 Elevation of levels of liver transaminase levels: Secondary | ICD-10-CM | POA: Diagnosis not present

## 2023-09-20 LAB — CK: Total CK: 233 U/L (ref 49–397)

## 2023-09-20 LAB — CULTURE, BLOOD (ROUTINE X 2)
Culture: NO GROWTH
Special Requests: ADEQUATE

## 2023-09-20 LAB — GLUCOSE, CAPILLARY
Glucose-Capillary: 145 mg/dL — ABNORMAL HIGH (ref 70–99)
Glucose-Capillary: 147 mg/dL — ABNORMAL HIGH (ref 70–99)
Glucose-Capillary: 187 mg/dL — ABNORMAL HIGH (ref 70–99)

## 2023-09-20 LAB — COMPREHENSIVE METABOLIC PANEL WITH GFR
ALT: 51 U/L — ABNORMAL HIGH (ref 0–44)
AST: 43 U/L — ABNORMAL HIGH (ref 15–41)
Albumin: 2.6 g/dL — ABNORMAL LOW (ref 3.5–5.0)
Alkaline Phosphatase: 56 U/L (ref 38–126)
Anion gap: 14 (ref 5–15)
BUN: 23 mg/dL (ref 8–23)
CO2: 22 mmol/L (ref 22–32)
Calcium: 8.4 mg/dL — ABNORMAL LOW (ref 8.9–10.3)
Chloride: 103 mmol/L (ref 98–111)
Creatinine, Ser: 0.95 mg/dL (ref 0.61–1.24)
GFR, Estimated: >60 mL/min (ref >60)
Glucose, Bld: 151 mg/dL — ABNORMAL HIGH (ref 70–99)
Potassium: 3.4 mmol/L — ABNORMAL LOW (ref 3.5–5.1)
Sodium: 139 mmol/L (ref 135–145)
Total Bilirubin: 1 mg/dL (ref 0.0–1.2)
Total Protein: 5.4 g/dL — ABNORMAL LOW (ref 6.5–8.1)

## 2023-09-20 MED ORDER — BISACODYL 10 MG RE SUPP
10.0000 mg | Freq: Every day | RECTAL | Status: DC | PRN
  Administered 2023-09-20: 10 mg via RECTAL
  Filled 2023-09-20: qty 1

## 2023-09-20 MED ORDER — POTASSIUM CHLORIDE 10 MEQ/100ML IV SOLN
10.0000 meq | INTRAVENOUS | Status: AC
  Administered 2023-09-20 (×4): 10 meq via INTRAVENOUS
  Filled 2023-09-20 (×4): qty 100

## 2023-09-20 MED ORDER — DOCUSATE SODIUM 100 MG PO CAPS
100.0000 mg | ORAL_CAPSULE | Freq: Two times a day (BID) | ORAL | Status: DC
  Administered 2023-09-20 – 2023-09-21 (×2): 100 mg via ORAL
  Filled 2023-09-20 (×2): qty 1

## 2023-09-20 MED ORDER — SENNOSIDES-DOCUSATE SODIUM 8.6-50 MG PO TABS
1.0000 | ORAL_TABLET | Freq: Every day | ORAL | Status: DC
  Administered 2023-09-20: 1 via ORAL
  Filled 2023-09-20: qty 1

## 2023-09-20 MED ORDER — HYDROMORPHONE HCL 1 MG/ML IJ SOLN: 0.2500 mg | INTRAMUSCULAR | Status: DC | PRN

## 2023-09-20 NOTE — Progress Notes (Signed)
 Patient continent of bowel and bladder this shift, given PRN suppository Bisacodyl  , patient able to transfer from chair to [facility], also using urinal for voiding.   09/20/23 1600  Unmeasured Output  Stool Occurrence 1  Stool Measurement/Characteristics  Bowel Incontinence No  Stool Type Type 3 (Sausage shape with surface cracks)  Has the patient had three Type 7 stools in the last 24 hours? No  Stool Descriptors Brown  Stool Amount Medium  Stool Source Rectum

## 2023-09-20 NOTE — Progress Notes (Signed)
 Physical Therapy Treatment Patient Details Name: Stephen [name] MRN: [id number] DOB: 1935/09/21 Today's Date: 09/20/2023   History of Present Illness Stephen [name] is a 88 year old male with extensive history of constipation, glaucoma, HTN, HLD, DM2, BPH, CKD, GERD, TIA,... Presented to ED after patient was found down by family at home.  Per family patient was found facedown on the tile floor outside the bathroom covered in liquid stool.  Patient  was found awake alert oriented x 2, unable to get up, and hold his weight on his legs.  Visible facial swelling on the left side and bruising on the right cheek.  Was not confirmed, unknown how long patient was down.  Last well-known was Wednesday.    PT Comments  Patient presents alert and agreeable, motivated for therapy.  Patient demonstrates slow labored movement for sitting up at bedside with fair carryover for propping up on elbows to hands and scooting to EOB requiring verbal/tactile cueing. Patient demonstrates increased BLE strength for completing sit to stands and increased endurance/distance for ambulating in room/hallway without loss of balance, but limited mostly due to c/o fatigue. Patient attempted to have bowel movement on BSC, but unable and tolerated sitting up in chair after therapy - nurse notified. Patient will benefit from continued skilled physical therapy in hospital and recommended venue below to increase strength, balance, endurance for safe ADLs and gait.      If plan is discharge home, recommend the following: A lot of help with bathing/dressing/bathroom;A lot of help with walking and/or transfers;Help with stairs or ramp for entrance;Assist for transportation   Can travel by private vehicle     No  Equipment Recommendations  None recommended by PT    Recommendations for Other Services       Precautions / Restrictions Precautions Precautions: Fall Recall of Precautions/Restrictions: Impaired Restrictions Weight  Bearing Restrictions Per Provider Order: No     Mobility  Bed Mobility Overal bed mobility: Needs Assistance Bed Mobility: Supine to Sit Rolling: Mod assist         General bed mobility comments: increased time, labored movement with fair carryover for scooting to EOB after verbal/tactile cueing    Transfers Overall transfer level: Needs assistance Equipment used: Rolling walker (2 wheels) Transfers: Sit to/from Stand, Bed to chair/wheelchair/BSC Sit to Stand: Min assist   Step pivot transfers: Min assist       General transfer comment: increased BLE strength for completing sit to stands, able to transfer to/from [facility] with verbal/tactile cueing for safety    Ambulation/Gait Ambulation/Gait assistance: Min assist, Mod assist Gait Distance (Feet): 35 Feet Assistive device: Rolling walker (2 wheels) Gait Pattern/deviations: Decreased step length - right, Decreased step length - left, Decreased stride length, Trunk flexed Gait velocity: decreased     General Gait Details: increased endurance/distance for gait training with slow unsteady labored movement without loss of balance and limited mostly due to fatigue   Stairs             Wheelchair Mobility     Tilt Bed    Modified Rankin (Stroke Patients Only)       Balance Overall balance assessment: Needs assistance Sitting-balance support: Feet supported, No upper extremity supported Sitting balance-Leahy Scale: Fair Sitting balance - Comments: fair/good seated at EOB   Standing balance support: Reliant on assistive device for balance, During functional activity, Bilateral upper extremity supported Standing balance-Leahy Scale: Poor Standing balance comment: fair/poor using RW  Communication Communication Communication: No apparent difficulties  Cognition Arousal: Alert Behavior During Therapy: WFL for tasks assessed/performed   PT - Cognitive impairments: No  family/caregiver present to determine baseline                         Following commands: Intact      Cueing Cueing Techniques: Verbal cues, Visual cues, Tactile cues  Exercises      General Comments        Pertinent Vitals/Pain Pain Assessment Pain Assessment: No/denies pain    Home Living                          Prior Function            PT Goals (current goals can now be found in the care plan section) Acute Rehab PT Goals Patient Stated Goal: return home PT Goal Formulation: With patient Time For Goal Achievement: 10/02/23 Potential to Achieve Goals: Good Progress towards PT goals: Progressing toward goals    Frequency    Min 3X/week      PT Plan      Co-evaluation              AM-PAC PT 6 Clicks Mobility   Outcome Measure  Help needed turning from your back to your side while in a flat bed without using bedrails?: A Little Help needed moving from lying on your back to sitting on the side of a flat bed without using bedrails?: A Lot Help needed moving to and from a bed to a chair (including a wheelchair)?: A Lot Help needed standing up from a chair using your arms (e.g., wheelchair or bedside chair)?: A Little Help needed to walk in hospital room?: A Lot Help needed climbing 3-5 steps with a railing? : A Lot 6 Click Score: 14    End of Session   Activity Tolerance: Patient tolerated treatment well;Patient limited by fatigue Patient left: in chair;with call bell/phone within reach;with chair alarm set Nurse Communication: Mobility status PT Visit Diagnosis: Repeated falls (R29.6);Muscle weakness (generalized) (M62.81);History of falling (Z91.81);Other abnormalities of gait and mobility (R26.89);Unsteadiness on feet (R26.81)     Time: 8492-8469 PT Time Calculation (min) (ACUTE ONLY): 23 min  Charges:    $Gait Training: 8-22 mins $Therapeutic Activity: 8-22 mins PT General Charges $$ ACUTE PT VISIT: 1 Visit                      3:50 PM, 09/20/23 Lynwood Music, MPT Physical Therapist with [facility] 336 [phone] office [phone] mobile phone

## 2023-09-20 NOTE — Progress Notes (Signed)
 PROGRESS NOTE   [name]  FMW:[id number] DOB: 06-23-1935 DOA: 09/15/2023 PCP: Shona Norleen PEDLAR, MD   Chief Complaint  Patient presents with   Altered Mental Status   Level of care: Med-Surg  Brief Admission History:  Stephen [name] is a 88 year old male with extensive history of constipation, glaucoma, HTN, HLD, DM2, BPH, CKD, GERD, TIA,... Presented to ED after patient was found down by family at home.  Per family patient was found facedown on the tile floor outside the bathroom covered in liquid stool.  Patient  was found awake alert oriented x 2, unable to get up, and hold his weight on his legs.  Visible facial swelling on the left side and bruising on the right cheek.  Was not confirmed, unknown how long patient was down.  Last well-known was Wednesday.  Patient was seen in the ED and was below chief complaint of constipation and impaction 2 days ago. Apparently at home he took a full bottle of magnesium  citrate. At this point patient has no complaints of pain, per family he was complaining of back pain at home. He is noted for progressive weakness and difficult to ambulate now.   ED evaluation: Blood pressure (!) 148/63, pulse 85, temperature 98.3 F (36.8 C), temperature source Rectal, resp. rate 17, height 5' 8 (1.727 m), weight 69.9 kg, SpO2 93%.  Labs: Glucose 140, BUN 37, creatinine 1.31, anion gap 19, creatinine 2.9, AST 334, ALT 75, total bilirubin 1.7, GFR 53, total CK 7000 422, troponin 113, 19, lactic acid 3, 2.3, WBC 19.8,Respiratory panel including;  RSV, influenza A/B COVID all negative, UA large hemoglobin, ketones, negative for bacteria, WBC 0-5, chest x-ray clean, CT head, chest, cervical spine, maxillofacial area follow-up reviewed negative for any acute traumatic findings.  Right renal calculi, the largest 7 mm, but no evidence of passing stone or hydroureteronephrosis. 4. Liquid stool in the colon but no evidence of obstruction or colitis. 5. Lumbar  degenerative changes. Potential for at least moderate spinal stenosis at L2-3 and L3-4.   Assessment and Plan:  Sepsis -- RULED OUT  - POA met SIRS criteria, with leukocytosis: WBC 19.8, creatinine 1.31, lactic acid 3.0,  - No source of infection, UA negative chest x-ray clean  - Presentation due to diarrhea, dehydration, lactic acidosis  Positive Blood Culture - Blood cultures from admission 09/15/2023 growing staph Caprae  - this is highly suspected to be a skin contaminant - Repeat blood cultures -no growth to date  - remains hemodynamically stable  Acute metabolic encephalopathy - RESOLVING - Metabolic encephalopathy with confusion resolving with supportive care  - Improved mentation awake alert oriented x 3 CT head negative, MRI of the brain reviewed in detail-negative for any acute changes chronic microvascular changes  -History of TIA, continue aspirin , with holding statins due to transaminitis, rhabdomyolysis  Falls Generalized weakness, status post fall -Severe debility and need for SNF documented -Per POA needs assist with all ADLs and  - PT OT recommending SNF   - S/p evaluation aside from superficial bruising no signs of traumatic laceration open wound - CT head cervical spine chest abdomen all reviewed, negative for any acute traumatic findings  CT head, chest, cervical spine, maxillofacial area follow-up reviewed negative for any acute traumatic findings.  Right renal calculi, the largest 7 mm, but no evidence of passing stone or hydroureteronephrosis. 4. Liquid stool in the colon but no evidence of obstruction or colitis. 5. Lumbar degenerative changes. Potential for at least moderate spinal stenosis  at L2-3 and L3-4.  - Fall precautions - MRI of the brain-reviewed, chronic microvascular changes  Elevated troponin - Pt denies chest pain - Elevated troponin with elevated total CK likely ischemic demand -No acute changes in EKG - Troponin trending down - ACS  ruled out  - As needed nitroglycerin, aspirin , analgesics  Transaminitis - Monitored LFTs and trending down - hepatitis panel negative for acute findings - Avoiding hepatotoxins  Lactic acidosis - Lactic acid 3.0>> 2.3 WBC 19.8 >> 12.6 - Afebrile hypertensive - Blood cultures are growing gram-positive cocci - treated with IV fluid hydration - likely due to severe dehydration   - UA-negative, chest x-ray within normal limits-no source of infection   Rhabdomyolysis - RESOLVING Due to poor wound mobility was found down and severe dehydration  - Total CK elevated to 7,422>>down to 701 with treatments  - With AKI with a creatinine of 1.31 - now resolved  -S/p aggressive IV fluid resuscitation  Type 2 diabetes mellitus - Holding home medication, checking CBG q. ACH, SSI coverage - resume home treatment at discharge CBG (last 3)  Recent Labs    09/19/23 1602 09/19/23 2206 09/20/23 0722  GLUCAP 166* 121* 145*    Chronic constipation C/o loose stool-diarrhea -No further diarrhea episode overnight - Status post ingestion of possible multiple bottles of mag citrate at home -Withholding bowel regimen -Monitoring closely  Carotid artery stenosis - Denies any chest pain - Continue aspirin , holding statins (due to transaminitis)  Stage 2 chronic kidney disease Acute on chronic stage II CKD - BUN 37, GFR 53 - Continue IV fluid resuscitation -Avoid nephrotoxin, hypotension  Lab Results  Component Value Date   CREATININE 0.95 09/20/2023   CREATININE 1.02 09/18/2023   CREATININE 1.12 09/17/2023   Glaucoma, both eyes Will continue eyedrop regimen including timolol , high-dose vitamin E   Essential hypertension - Resuming home medication of Norvasc  and metoprolol  - as needed hydralazine  Mixed hyperlipidemia - temporarily Holding home medication of Crestor  due to transaminitis, and rhabdo myelitis   DVT prophylaxis: sq heparin  Code Status: DNR  Family Communication:   Disposition: SNF placement     Subjective: Pt eager to discharge out of hospital.   Objective: Vitals:   09/19/23 1419 09/19/23 2053 09/20/23 0514 09/20/23 0524  BP: 134/71 (!) 151/57 (!) 154/66   Pulse: 70 68 94   Resp: 19 (!) 21 18   Temp: 98.6 F (37 C) 97.9 F (36.6 C) 97.7 F (36.5 C)   TempSrc: Oral Oral Oral   SpO2: 96% 99% 95%   Weight:    73.9 kg  Height:        Intake/Output Summary (Last 24 hours) at 09/20/2023 1108 Last data filed at 09/20/2023 0800 Gross per 24 hour  Intake 1120.82 ml  Output 950 ml  Net 170.82 ml   Filed Weights   09/18/23 0500 09/19/23 0500 09/20/23 0524  Weight: 77.6 kg 75 kg 73.9 kg   Examination:  General exam: Appears calm and comfortable  Respiratory system: Clear to auscultation. Respiratory effort normal. Cardiovascular system: normal S1 & S2 heard. No JVD, murmurs, rubs, gallops or clicks. No pedal edema. Gastrointestinal system: Abdomen is nondistended, soft and nontender. No organomegaly or masses felt. Normal bowel sounds heard. Central nervous system: Alert and oriented. No focal neurological deficits. Extremities: Symmetric 5 x 5 power. Skin: No rashes, lesions or ulcers. Psychiatry: Judgement and insight appear normal. Mood & affect appropriate.   Data Reviewed: I have personally reviewed following labs and imaging studies  CBC: Recent Labs  Lab 09/15/23 0905 09/16/23 0912 09/17/23 0023 09/18/23 0443 09/19/23 0413  WBC 19.8* 12.6* 10.2 8.6 9.5  NEUTROABS 16.0*  --   --   --   --   HGB 16.2 14.2 12.7* 13.1 13.9  HCT 46.3 41.0 36.2* 38.1* 40.1  MCV 93.7 94.5 94.3 94.8 95.0  PLT 175 142* 138* 150 169    Basic Metabolic Panel: Recent Labs  Lab 09/15/23 0905 09/15/23 1223 09/16/23 0912 09/17/23 0023 09/18/23 0443 09/20/23 0406  NA 139  --  135 138 138 139  K 4.5  --  3.9 4.1 3.6 3.4*  CL 99  --  101 102 105 103  CO2 21*  --  22 24 23 22   GLUCOSE 140*  --  111* 111* 101* 151*  BUN 37*  --  30* 29* 17 23   CREATININE 1.31*  --  1.09 1.12 1.02 0.95  CALCIUM  9.3  --  8.6* 8.6* 8.5* 8.4*  MG 2.9* 2.8*  --   --   --   --   PHOS  --  4.0  --   --   --   --     CBG: Recent Labs  Lab 09/19/23 0742 09/19/23 1059 09/19/23 1602 09/19/23 2206 09/20/23 0722  GLUCAP 138* 189* 166* 121* 145*    Recent Results (from the past 240 hours)  Culture, blood (routine x 2)     Status: Abnormal   Collection Time: 09/15/23  9:05 AM   Specimen: BLOOD  Result Value Ref Range Status   Specimen Description   Final    BLOOD LEFT ARM Performed at [facility], [address] [place], [place] [address]    Special Requests   Final    BOTTLES DRAWN AEROBIC AND ANAEROBIC Blood Culture adequate volume Performed at [facility], [address], [place], [place] [address]    Culture  Setup Time   Final    GRAM POSITIVE COCCI AEROBIC BOTTLE ONLY Gram Stain Report Called to,Read Back By and Verified With: ASHLEY S. ON 09/16/2023 @10 :28 BY T. HAMER  CRITICAL RESULT CALLED TO, READ BACK BY AND VERIFIED WITH: RN FREDRIK BERRY [id number] AT 1837 BY EC    Culture (A)  Final    STAPHYLOCOCCUS CAPRAE THE SIGNIFICANCE OF ISOLATING THIS ORGANISM FROM A SINGLE SET OF BLOOD CULTURES WHEN MULTIPLE SETS ARE DRAWN IS UNCERTAIN. PLEASE NOTIFY THE MICROBIOLOGY DEPARTMENT WITHIN ONE WEEK IF SPECIATION AND SENSITIVITIES ARE REQUIRED. Performed at [facility], [address], [place], [place] [address]    Report Status 09/18/2023 FINAL  Final  Blood Culture ID Panel (Reflexed)     Status: Abnormal   Collection Time: 09/15/23  9:05 AM  Result Value Ref Range Status   Enterococcus faecalis NOT DETECTED NOT DETECTED Final   Enterococcus Faecium NOT DETECTED NOT DETECTED Final   Listeria monocytogenes NOT DETECTED NOT DETECTED Final   Staphylococcus species DETECTED (A) NOT DETECTED Final    Comment: CRITICAL RESULT CALLED TO, READ BACK BY AND VERIFIED WITH: RN FREDRIK BERRY [id number] AT 1837 BY EC    Staphylococcus aureus (BCID)  NOT DETECTED NOT DETECTED Final   Staphylococcus epidermidis NOT DETECTED NOT DETECTED Final   Staphylococcus lugdunensis NOT DETECTED NOT DETECTED Final   Streptococcus species NOT DETECTED NOT DETECTED Final   Streptococcus agalactiae NOT DETECTED NOT DETECTED Final   Streptococcus pneumoniae NOT DETECTED NOT DETECTED Final   Streptococcus pyogenes NOT DETECTED NOT DETECTED Final   A.calcoaceticus-baumannii NOT  DETECTED NOT DETECTED Final   Bacteroides fragilis NOT DETECTED NOT DETECTED Final   Enterobacterales NOT DETECTED NOT DETECTED Final   Enterobacter cloacae complex NOT DETECTED NOT DETECTED Final   Escherichia coli NOT DETECTED NOT DETECTED Final   Klebsiella aerogenes NOT DETECTED NOT DETECTED Final   Klebsiella oxytoca NOT DETECTED NOT DETECTED Final   Klebsiella pneumoniae NOT DETECTED NOT DETECTED Final   Proteus species NOT DETECTED NOT DETECTED Final   Salmonella species NOT DETECTED NOT DETECTED Final   Serratia marcescens NOT DETECTED NOT DETECTED Final   Haemophilus influenzae NOT DETECTED NOT DETECTED Final   Neisseria meningitidis NOT DETECTED NOT DETECTED Final   Pseudomonas aeruginosa NOT DETECTED NOT DETECTED Final   Stenotrophomonas maltophilia NOT DETECTED NOT DETECTED Final   Candida albicans NOT DETECTED NOT DETECTED Final   Candida auris NOT DETECTED NOT DETECTED Final   Candida glabrata NOT DETECTED NOT DETECTED Final   Candida krusei NOT DETECTED NOT DETECTED Final   Candida parapsilosis NOT DETECTED NOT DETECTED Final   Candida tropicalis NOT DETECTED NOT DETECTED Final   Cryptococcus neoformans/gattii NOT DETECTED NOT DETECTED Final    Comment: Performed at [facility], [address] [place], [place] [address]  Culture, blood (routine x 2)     Status: None   Collection Time: 09/15/23  9:58 AM   Specimen: BLOOD  Result Value Ref Range Status   Specimen Description BLOOD BLOOD RIGHT HAND  Final   Special Requests   Final    BOTTLES DRAWN  AEROBIC AND ANAEROBIC Blood Culture adequate volume   Culture   Final    NO GROWTH 5 DAYS Performed at [facility], [address] [place], [place] [address]    Report Status 09/20/2023 FINAL  Final  Resp panel by RT-PCR (RSV, Flu A&B, Covid) Anterior Nasal Swab     Status: None   Collection Time: 09/15/23 11:17 AM   Specimen: Anterior Nasal Swab  Result Value Ref Range Status   SARS Coronavirus 2 by RT PCR NEGATIVE NEGATIVE Final    Comment: (NOTE) SARS-CoV-2 target nucleic acids are NOT DETECTED.  The SARS-CoV-2 RNA is generally detectable in upper respiratory specimens during the acute phase of infection. The lowest concentration of SARS-CoV-2 viral copies this assay can detect is 138 copies/mL. A negative result does not preclude SARS-Cov-2 infection and should not be used as the sole basis for treatment or other patient management decisions. A negative result may occur with  improper specimen collection/handling, submission of specimen other than nasopharyngeal swab, presence of viral mutation(s) within the areas targeted by this assay, and inadequate number of viral copies(<138 copies/mL). A negative result must be combined with clinical observations, patient history, and epidemiological information. The expected result is Negative.  Fact Sheet for Patients:  BloggerCourse.com  Fact Sheet for Healthcare Providers:  SeriousBroker.it  This test is no t yet approved or cleared by the [place]  FDA and  has been authorized for detection and/or diagnosis of SARS-CoV-2 by FDA under an Emergency Use Authorization (EUA). This EUA will remain  in effect (meaning this test can be used) for the duration of the COVID-19 declaration under Section 564(b)(1) of the Act, 21 U.S.C.section 360bbb-3(b)(1), unless the authorization is terminated  or revoked sooner.       Influenza A by PCR NEGATIVE NEGATIVE Final   Influenza B by PCR  NEGATIVE NEGATIVE Final    Comment: (NOTE) The Xpert Xpress SARS-CoV-2/FLU/RSV plus assay is intended as an aid in the  diagnosis of influenza from Nasopharyngeal swab specimens and should not be used as a sole basis for treatment. Nasal washings and aspirates are unacceptable for Xpert Xpress SARS-CoV-2/FLU/RSV testing.  Fact Sheet for Patients: BloggerCourse.com  Fact Sheet for Healthcare Providers: SeriousBroker.it  This test is not yet approved or cleared by the [place]  FDA and has been authorized for detection and/or diagnosis of SARS-CoV-2 by FDA under an Emergency Use Authorization (EUA). This EUA will remain in effect (meaning this test can be used) for the duration of the COVID-19 declaration under Section 564(b)(1) of the Act, 21 U.S.C. section 360bbb-3(b)(1), unless the authorization is terminated or revoked.     Resp Syncytial Virus by PCR NEGATIVE NEGATIVE Final    Comment: (NOTE) Fact Sheet for Patients: BloggerCourse.com  Fact Sheet for Healthcare Providers: SeriousBroker.it  This test is not yet approved or cleared by the [place]  FDA and has been authorized for detection and/or diagnosis of SARS-CoV-2 by FDA under an Emergency Use Authorization (EUA). This EUA will remain in effect (meaning this test can be used) for the duration of the COVID-19 declaration under Section 564(b)(1) of the Act, 21 U.S.C. section 360bbb-3(b)(1), unless the authorization is terminated or revoked.  Performed at [facility], [address], [place], [place] [address]   MRSA Next Gen by PCR, Nasal     Status: None   Collection Time: 09/16/23 12:00 AM   Specimen: Nasal Mucosa; Nasal Swab  Result Value Ref Range Status   MRSA by PCR Next Gen NOT DETECTED NOT DETECTED Final    Comment: (NOTE) The GeneXpert MRSA Assay (FDA approved for NASAL specimens only), is one  component of a comprehensive MRSA colonization surveillance program. It is not intended to diagnose MRSA infection nor to guide or monitor treatment for MRSA infections. Test performance is not FDA approved in patients less than 58 years old. Performed at [facility], [address], [place], [place] [address]   Culture, blood (Routine X 2) w Reflex to ID Panel     Status: None (Preliminary result)   Collection Time: 09/17/23 12:20 AM   Specimen: BLOOD RIGHT ARM  Result Value Ref Range Status   Specimen Description BLOOD RIGHT ARM  Final   Special Requests   Final    BOTTLES DRAWN AEROBIC AND ANAEROBIC Blood Culture adequate volume   Culture   Final    NO GROWTH 3 DAYS Performed at [facility], [address], [place], [place] [address]    Report Status PENDING  Incomplete  Culture, blood (Routine X 2) w Reflex to ID Panel     Status: None (Preliminary result)   Collection Time: 09/17/23 12:23 AM   Specimen: BLOOD RIGHT HAND  Result Value Ref Range Status   Specimen Description BLOOD RIGHT HAND  Final   Special Requests AEROBIC BOTTLE ONLY Blood Culture adequate volume  Final   Culture   Final    NO GROWTH 3 DAYS Performed at [facility], [address], [place], [place] [address]    Report Status PENDING  Incomplete     Radiology Studies: ECHOCARDIOGRAM COMPLETE Result Date: 09/18/2023    ECHOCARDIOGRAM REPORT   Patient Name:   HARINDER ROMAS Date of Exam: 09/18/2023 Medical Rec #:  [id number]        Height:       68.0 in Accession #:    [id number]       Weight:       171.1 lb Date of Birth:  April 02, 1935  BSA:          1.912 m Patient Age:    88 years         BP:           171/73 mmHg Patient Gender: M                HR:           82 bpm. Exam Location:  [facility] Procedure: 2D Echo, Cardiac Doppler and Color Doppler (Both Spectral and Color            Flow Doppler were utilized during procedure). Indications:    Bacteremia R78.81  History:        Patient has prior history of  Echocardiogram examinations, most                 recent 07/02/2013. CAD, TIA; Risk Factors:Hypertension, Diabetes                 and Dyslipidemia.  Sonographer:    Aida Pizza RCS Referring Phys: [phone] SEYED A SHAHMEHDI IMPRESSIONS  1. Left ventricular ejection fraction, by estimation, is >75%. The left ventricle has hyperdynamic function. The left ventricle has no regional wall motion abnormalities. Left ventricular diastolic parameters are indeterminate.  2. Right ventricular systolic function is normal. The right ventricular size is normal.  3. Left atrial size was mildly dilated.  4. The mitral valve is normal in structure. No evidence of mitral valve regurgitation. No evidence of mitral stenosis.  5. The aortic valve is tricuspid. Aortic valve regurgitation is mild. No aortic stenosis is present.  6. The inferior vena cava is normal in size with greater than 50% respiratory variability, suggesting right atrial pressure of 3 mmHg. FINDINGS  Left Ventricle: Left ventricular ejection fraction, by estimation, is >75%. The left ventricle has hyperdynamic function. The left ventricle has no regional wall motion abnormalities. The left ventricular internal cavity size was normal in size. There is no left ventricular hypertrophy. Left ventricular diastolic parameters are indeterminate. Right Ventricle: The right ventricular size is normal. Right vetricular wall thickness was not well visualized. Right ventricular systolic function is normal. Left Atrium: Left atrial size was mildly dilated. Right Atrium: Right atrial size was normal in size. Pericardium: There is no evidence of pericardial effusion. Mitral Valve: The mitral valve is normal in structure. No evidence of mitral valve regurgitation. No evidence of mitral valve stenosis. Tricuspid Valve: The tricuspid valve is normal in structure. Tricuspid valve regurgitation is not demonstrated. No evidence of tricuspid stenosis. Aortic Valve: The aortic valve is  tricuspid. Aortic valve regurgitation is mild. No aortic stenosis is present. Aortic valve mean gradient measures 6.0 mmHg. Aortic valve peak gradient measures 13.1 mmHg. Aortic valve area, by VTI measures 2.31 cm. Pulmonic Valve: The pulmonic valve was not well visualized. Pulmonic valve regurgitation is not visualized. No evidence of pulmonic stenosis. Aorta: The aortic root is normal in size and structure and the ascending aorta was not well visualized. Venous: The inferior vena cava is normal in size with greater than 50% respiratory variability, suggesting right atrial pressure of 3 mmHg. IAS/Shunts: No atrial level shunt detected by color flow Doppler.  LEFT VENTRICLE PLAX 2D LVIDd:         3.90 cm   Diastology LVIDs:         1.90 cm   LV e' medial:    8.70 cm/s LV PW:         1.00 cm   LV  E/e' medial:  14.9 LV IVS:        1.00 cm   LV e' lateral:   10.80 cm/s LVOT diam:     1.90 cm   LV E/e' lateral: 12.0 LV SV:         82 LV SV Index:   43 LVOT Area:     2.84 cm  RIGHT VENTRICLE RV S prime:     18.52 cm/s TAPSE (M-mode): 3.2 cm LEFT ATRIUM             Index        RIGHT ATRIUM           Index LA diam:        3.40 cm 1.78 cm/m   RA Area:     17.70 cm LA Vol (A2C):   93.6 ml 48.94 ml/m  RA Volume:   45.60 ml  23.84 ml/m LA Vol (A4C):   53.1 ml 27.77 ml/m LA Biplane Vol: 73.8 ml 38.59 ml/m  AORTIC VALVE AV Area (Vmax):    2.12 cm AV Area (Vmean):   2.29 cm AV Area (VTI):     2.31 cm AV Vmax:           181.00 cm/s AV Vmean:          107.000 cm/s AV VTI:            0.356 m AV Peak Grad:      13.1 mmHg AV Mean Grad:      6.0 mmHg LVOT Vmax:         135.50 cm/s LVOT Vmean:        86.400 cm/s LVOT VTI:          0.290 m LVOT/AV VTI ratio: 0.82  AORTA Ao Root diam: 3.80 cm MITRAL VALVE MV Area (PHT): 3.12 cm     SHUNTS MV Decel Time: 243 msec     Systemic VTI:  0.29 m MV E velocity: 130.00 cm/s  Systemic Diam: 1.90 cm MV A velocity: 134.00 cm/s MV E/A ratio:  0.97 Dorn Ross MD Electronically signed  by Dorn Ross MD Signature Date/Time: 09/18/2023/6:22:01 PM    Final     Scheduled Meds:  amLODipine   10 mg Oral QPM   aspirin   325 mg Oral Daily   cholecalciferol   5,000 Units Oral Daily   cyanocobalamin   1,000 mcg Oral Daily   docusate sodium   100 mg Oral Daily   heparin   5,000 Units Subcutaneous Q8H   insulin  aspart  0-6 Units Subcutaneous TID WC   lactobacillus  1 g Oral TID WC   Latanoprostene Bunod   1 drop Both Eyes QHS   magnesium  gluconate  500 mg Oral QHS   melatonin  6 mg Oral QHS   metoprolol  succinate  100 mg Oral Daily   multivitamin with minerals  1 tablet Oral Daily   niacin   1,000 mg Oral QHS   sodium chloride  flush  3 mL Intravenous Q12H   sodium chloride  flush  3 mL Intravenous Q12H   timolol   1 drop Both Eyes BID   traZODone   50 mg Oral QHS   vitamin E   400 Units Oral Daily   Continuous Infusions:  potassium chloride  10 mEq (09/20/23 1030)     LOS: 4 days   Time spent: 40 mins  Kerina Simoneau Vicci, MD How to contact the [facility] Attending or Consulting provider 7A - 7P or covering provider during after hours 7P -7A, for this patient?  Check the care  team in [facility] and look for a) attending/consulting [facility] provider listed and b) the TRH team listed Log into [email or web address] to find provider on call.  Locate the [facility] provider you are looking for under [facility] and page to a number that you can be directly reached. If you still have difficulty reaching the provider, please page the [facility] (Director on Call) for the Hospitalists listed on amion for assistance.  09/20/2023, 11:08 AM

## 2023-09-21 DIAGNOSIS — R41841 Cognitive communication deficit: Secondary | ICD-10-CM | POA: Diagnosis not present

## 2023-09-21 DIAGNOSIS — R296 Repeated falls: Secondary | ICD-10-CM | POA: Diagnosis not present

## 2023-09-21 DIAGNOSIS — R6510 Systemic inflammatory response syndrome (SIRS) of non-infectious origin without acute organ dysfunction: Secondary | ICD-10-CM | POA: Diagnosis not present

## 2023-09-21 DIAGNOSIS — G4700 Insomnia, unspecified: Secondary | ICD-10-CM | POA: Diagnosis not present

## 2023-09-21 DIAGNOSIS — R7401 Elevation of levels of liver transaminase levels: Secondary | ICD-10-CM | POA: Diagnosis not present

## 2023-09-21 DIAGNOSIS — K219 Gastro-esophageal reflux disease without esophagitis: Secondary | ICD-10-CM | POA: Diagnosis not present

## 2023-09-21 DIAGNOSIS — I6529 Occlusion and stenosis of unspecified carotid artery: Secondary | ICD-10-CM | POA: Diagnosis not present

## 2023-09-21 DIAGNOSIS — N182 Chronic kidney disease, stage 2 (mild): Secondary | ICD-10-CM | POA: Diagnosis not present

## 2023-09-21 DIAGNOSIS — M6282 Rhabdomyolysis: Secondary | ICD-10-CM | POA: Diagnosis not present

## 2023-09-21 DIAGNOSIS — M1990 Unspecified osteoarthritis, unspecified site: Secondary | ICD-10-CM | POA: Diagnosis not present

## 2023-09-21 DIAGNOSIS — E872 Acidosis, unspecified: Secondary | ICD-10-CM | POA: Diagnosis not present

## 2023-09-21 DIAGNOSIS — G9341 Metabolic encephalopathy: Secondary | ICD-10-CM | POA: Diagnosis not present

## 2023-09-21 DIAGNOSIS — E7849 Other hyperlipidemia: Secondary | ICD-10-CM | POA: Diagnosis not present

## 2023-09-21 DIAGNOSIS — R7881 Bacteremia: Secondary | ICD-10-CM | POA: Diagnosis not present

## 2023-09-21 DIAGNOSIS — K5909 Other constipation: Secondary | ICD-10-CM

## 2023-09-21 DIAGNOSIS — R7989 Other specified abnormal findings of blood chemistry: Secondary | ICD-10-CM | POA: Diagnosis not present

## 2023-09-21 DIAGNOSIS — E782 Mixed hyperlipidemia: Secondary | ICD-10-CM | POA: Diagnosis not present

## 2023-09-21 DIAGNOSIS — E119 Type 2 diabetes mellitus without complications: Secondary | ICD-10-CM | POA: Diagnosis not present

## 2023-09-21 DIAGNOSIS — I10 Essential (primary) hypertension: Secondary | ICD-10-CM | POA: Diagnosis not present

## 2023-09-21 DIAGNOSIS — N400 Enlarged prostate without lower urinary tract symptoms: Secondary | ICD-10-CM | POA: Diagnosis not present

## 2023-09-21 DIAGNOSIS — R8271 Bacteriuria: Secondary | ICD-10-CM | POA: Diagnosis not present

## 2023-09-21 DIAGNOSIS — A419 Sepsis, unspecified organism: Secondary | ICD-10-CM | POA: Diagnosis not present

## 2023-09-21 DIAGNOSIS — H409 Unspecified glaucoma: Secondary | ICD-10-CM | POA: Diagnosis not present

## 2023-09-21 DIAGNOSIS — M6281 Muscle weakness (generalized): Secondary | ICD-10-CM | POA: Diagnosis not present

## 2023-09-21 DIAGNOSIS — Z8673 Personal history of transient ischemic attack (TIA), and cerebral infarction without residual deficits: Secondary | ICD-10-CM | POA: Diagnosis not present

## 2023-09-21 DIAGNOSIS — F01518 Vascular dementia, unspecified severity, with other behavioral disturbance: Secondary | ICD-10-CM | POA: Diagnosis not present

## 2023-09-21 DIAGNOSIS — R531 Weakness: Secondary | ICD-10-CM | POA: Diagnosis not present

## 2023-09-21 DIAGNOSIS — I82402 Acute embolism and thrombosis of unspecified deep veins of left lower extremity: Secondary | ICD-10-CM | POA: Diagnosis not present

## 2023-09-21 DIAGNOSIS — E1143 Type 2 diabetes mellitus with diabetic autonomic (poly)neuropathy: Secondary | ICD-10-CM | POA: Diagnosis not present

## 2023-09-21 DIAGNOSIS — H579 Unspecified disorder of eye and adnexa: Secondary | ICD-10-CM | POA: Diagnosis not present

## 2023-09-21 LAB — GLUCOSE, CAPILLARY: Glucose-Capillary: 147 mg/dL — ABNORMAL HIGH (ref 70–99)

## 2023-09-21 MED ORDER — DOCUSATE SODIUM 100 MG PO CAPS
100.0000 mg | ORAL_CAPSULE | Freq: Two times a day (BID) | ORAL | Status: AC
Start: 2023-09-21 — End: ?

## 2023-09-21 NOTE — H&P (Signed)
 [facility]  History & Physical Date: 09/21/23 Patient Name / DOB: Stephen [name]  Jan 19, 1936   Room: S115/S115-01               Patient Care Team: Shona Norleen Folk, MD as PCP - General  Assessment & Plan  Weakness secondary to rhabdomyolysis complicated with lactic acidosis.  Bacteremia.  Transaminitis.  Elevated troponin.  Acute on chronic kidney disease.  Secondary diagnosis  Repeated falls Mixed hyperlipidemia Hypertension  Glaucoma Diabetes mellitus with nephropathy BPH Personal history of CVA    Principal Problem:   Traumatic rhabdomyolysis Active Problems:   Repeated falls   Bacteremia   Lactic acidosis   Transaminitis   Elevated troponin level   Muscle weakness (generalized)   Mixed hyperlipidemia   Essential hypertension, benign   Glaucoma of both eyes   Chronic kidney disease, stage II (mild)   Carotid artery stenosis   Chronic constipation   Diabetes mellitus, type II       Enlargement (benign) of prostate   Gastroesophageal reflux disease   Personal history of transient ischemic attack   Osteoarthritis   Traumatic rhabdomyolysis, initial encounter     Level of Care: skilled  DVT prophylaxis:  risks of mechanical & pharmacological prophylaxis outweigh benefits Anticipated disposition: To Home with Home Health Estimated discharge:  3 weeks   Chief Complaint   Leg weakness.  Inability to stand and walk.  Frequent falls.  Poor appetite.  Problem resting and sleeping.  History Of Present Illness   Stephen [name] is a  88 y.o.  male with known history of multiple medical problems including hypertension.  Osteoarthritis.  Hyperlipidemia.  Glaucoma.  CVA.  Was admitted recently in the hospital after multiple falls with inability to get up complicated with rhabdomyolysis.  And profound muscle weakness however despite aggressive treatment patient continued to remain weak any need for additional  physical therapy and Occupational Therapy to improve his muscle strain.  In addition to close monitoring of his medication  Review Of Systems  As mentioned above  Allergies  Benicar hct [olmesartan-hydrochlorothiazide], Benicar [olmesartan], Celecoxib, Combigan [brimonidine-timolol ], Crestor  [rosuvastatin ], Dorzolamide, Doxycycline, Hydralazine analogues, Latanoprost, Levofloxacin, Metronidazole, Plavix  [clopidogrel ], Propoxyphene-acetaminophen , Tetracycline, and Clarithromycin   Home Medications   Prior to Admission medications  Not on File    Medical History  Past Medical History[1] As mentioned above Surgical History  Past Surgical History[2] Cataract surgery Social History  Patient is single.  Denies smoking denies alcohol abuse. Have no children. Used to do verity of jobs including Estate agent Social History[3]  Family History  Family History[4]  Father has MI Mother has breast cancer Brother has lung cancer Sister has lung cancer  Code Status  DNR and DNI  Objective  Temp:  [36.6 C (97.9 F)] 36.6 C (97.9 F) Pulse:  [81] 81 Resp:  [18] 18 BP: (159)/(74) 159/74 SpO2:  [96 %] 96 %  Physical Exam   General:  chronically ill appearing    Neuro:   Somnolent. CV:   normal S1, S2 Pulmonary:  Bilateral rhonchi with good air exchange Abdomen:  soft, nontender, nondistended Extremities:  Bilateral leg weakness and tenderness MSK:   2/5 strength throughout HEENT:  normacephalic and atraumatic GU:   not assessed SKIN:   no petechiae  Lab Results  Labs Reviewed:  Yes No results found for: WBC, HGB, HCT, PLT  No results found for: NA, K, CL, CO2, BUN, CREATININE, GLU, CALCIUM , MG, PHOS  Pending Labs    Imaging   No results found.  He is good rehab candidate Unfortunately his recovery is going to be slow Is going to need intensive physical therapy.  Occupational Therapy and speech therapy Prognosis overall  remain poor   Yancey DELENA Reno, MD             [1] No past medical history on file. [2] No past surgical history on file. [3]   [4] No family history on file.

## 2023-09-21 NOTE — Progress Notes (Signed)
 Mobility Specialist Progress Note:    09/21/23 1045  Mobility  Activity Stood at bedside  Level of Assistance Minimal assist, patient does 75% or more  Assistive Device Front wheel walker  Distance Ambulated (ft) 2 ft  Range of Motion/Exercises Active;All extremities  Activity Response Tolerated well  Mobility Referral Yes  Mobility visit 1 Mobility  Mobility Specialist Start Time (ACUTE ONLY) 1045  Mobility Specialist Stop Time (ACUTE ONLY) 1105  Mobility Specialist Time Calculation (min) (ACUTE ONLY) 20 min   Pt received in chair, family in room. NT requesting assistance with pt. Performed sit to stand x3, required MinA with RW. NT in room, all needs met.  Melynda Krzywicki Mobility Specialist Please contact via Special educational needs teacher or  Rehab office at [phone]

## 2023-09-21 NOTE — Plan of Care (Signed)
  Problem: Clinical Measurements: Goal: Ability to maintain clinical measurements within normal limits will improve Outcome: Progressing Goal: Will remain free from infection Outcome: Progressing Goal: Diagnostic test results will improve Outcome: Progressing Goal: Respiratory complications will improve Outcome: Progressing Goal: Cardiovascular complication will be avoided Outcome: Progressing   Problem: Activity: Goal: Risk for activity intolerance will decrease Outcome: Progressing   Problem: Nutrition: Goal: Adequate nutrition will be maintained Outcome: Progressing   Problem: Coping: Goal: Level of anxiety will decrease Outcome: Progressing   Problem: Elimination: Goal: Will not experience complications related to urinary retention Outcome: Progressing   Problem: Pain Managment: Goal: General experience of comfort will improve and/or be controlled Outcome: Progressing   Problem: Skin Integrity: Goal: Risk for impaired skin integrity will decrease Outcome: Progressing   Problem: Coping: Goal: Ability to adjust to condition or change in health will improve Outcome: Progressing   Problem: Fluid Volume: Goal: Ability to maintain a balanced intake and output will improve Outcome: Progressing   Problem: Health Behavior/Discharge Planning: Goal: Ability to identify and utilize available resources and services will improve Outcome: Progressing   Problem: Metabolic: Goal: Ability to maintain appropriate glucose levels will improve Outcome: Progressing   Problem: Nutritional: Goal: Maintenance of adequate nutrition will improve Outcome: Progressing Goal: Progress toward achieving an optimal weight will improve Outcome: Progressing

## 2023-09-21 NOTE — TOC Transition Note (Signed)
 Transition of Care [facility]) - Discharge Note   Patient Details  Name: Stephen [name] MRN: [id number] Date of Birth: Jun 07, 1935  [facility] CM/SW Contact:  Mcarthur Saddie Kim, LCSW Phone Number: 09/21/2023, 9:14 AM   Clinical Narrative: Pt d/c today to [facility]. Pt's POA, [name] aware and agreeable. SNF notified of d/c. [name] requests [employer]. LCSW to arrange. [place] waiver signed and on chart. D/C summary sent to SNF. RN given number to call report.       Final next level of care: Skilled Nursing Facility Barriers to Discharge: Barriers Resolved   Patient Goals and CMS Choice Patient states their goals for this hospitalization and ongoing recovery are:: Strengthening and return home CMS Medicare.gov Compare Post Acute Care list provided to:: Patient Choice offered to / list presented to : Patient [place] ownership interest in [facility].provided to:: Patient    Discharge Placement              Patient chooses bed at: Other - please specify in the comment section below: ([facility]) Patient to be transferred to facility by: [facility] Name of family member notified: [name] Patient and family notified of of transfer: 09/21/23  Discharge Plan and Services Additional resources added to the After Visit Summary for   In-house Referral: Clinical Social Work Discharge Planning Services: CM Consult Post Acute Care Choice: Skilled Nursing Facility                               Social Drivers of Health (SDOH) Interventions SDOH Screenings   Food Insecurity: Patient Unable To Answer (09/15/2023)  Housing: Unknown (09/15/2023)  Transportation Needs: Patient Unable To Answer (09/15/2023)  Utilities: Patient Unable To Answer (09/15/2023)  Depression (PHQ2-9): Low Risk  (10/29/2021)  Financial Resource Strain: Low Risk  (10/29/2021)  Social Connections: Unknown (09/15/2023)  Stress: No Stress Concern Present (10/29/2021)  Tobacco Use:  Low Risk  (11/24/2021)     Readmission Risk Interventions    09/17/2023    2:56 PM  Readmission Risk Prevention Plan  Transportation Screening Complete  PCP or Specialist Appt within 3-5 Days Complete  HRI or Home Care Consult Complete  Social Work Consult for Recovery Care Planning/Counseling Complete  Palliative Care Screening Not Applicable  Medication Review Oceanographer) Complete

## 2023-09-21 NOTE — Care Management Important Message (Signed)
 Important Message  Patient Details  Name: Stephen [name] MRN: [id number] Date of Birth: 1935-07-25   Important Message Given:  Yes - Medicare IM     Arrietty Dercole L Godson Pollan 09/21/2023, 10:20 AM

## 2023-09-22 LAB — CULTURE, BLOOD (ROUTINE X 2)
Culture: NO GROWTH
Culture: NO GROWTH
Special Requests: ADEQUATE
Special Requests: ADEQUATE

## 2023-10-03 NOTE — Nursing Note (Signed)
 Resident alert and verbal.  Self propels hallways on unit.  Pleasant and cooperative. Takes medications easily.  No c/o pain or discomfort.  Skin warm and dry, resps even and unlabored.  Remains on OT/ST/PT caseload.  Call light and hydration within reach.

## 2023-10-04 NOTE — Discharge Summary (Signed)
 Expand All Collapse All      Physician Discharge Summary    Patient: Stephen [name] MRN: [id number] DOB: [birth year]  Admit date:     09/15/2023  Discharge date: 09/21/23  Discharge Physician: Afton Louder    PCP: Shona Norleen PEDLAR, MD    Discharge to SNF    Recommendations at discharge:    Follow-up with the PCP in 1-2 weeks Follow-up with neurology for further evaluation and assessment for noted short and long-term memory loss Continue to advance diet as tolerated, fall precaution, PT OT Fall precautions recommended  Monitor blood glucose and manage per facility protocol  Current medication need to be reviewed over time with modification   Discharge Diagnoses: Principal Problem:   Sepsis (HCC) Active Problems:   Mixed hyperlipidemia   Essential hypertension   Glaucoma, both eyes   Stage 2 chronic kidney disease   Carotid artery stenosis   Chronic constipation   Type 2 diabetes mellitus (HCC)   Rhabdomyolysis   Lactic acidosis   Transaminitis   Elevated troponin   Falls   Acute metabolic encephalopathy   Bacteremia   Resolved Problems:   * No resolved hospital problems. *   Hospital Course: Stephen [name] is a 88 year old male with extensive history of constipation, glaucoma, HTN, HLD, DM2, BPH, CKD, GERD, TIA,... Presented to ED after patient was found down by family at home.  Per family patient was found facedown on the tile floor outside the bathroom covered in liquid stool.  Patient  was found awake alert oriented x 2, unable to get up, and hold his weight on his legs.  Visible facial swelling on the left side and bruising on the right cheek.  Was not confirmed, unknown how long patient was down.  Last well-known was Wednesday.   Patient was seen in the ED and was below chief complaint of constipation and impaction 2 days ago. Apparently at home he took a full bottle of magnesium  citrate. At this point patient has no complaints of pain, per family he was  complaining of back pain at home. He is noted for progressive weakness and difficult to ambulate now.     ED evaluation: Blood pressure (!) 148/63, pulse 85, temperature 98.3 F (36.8 C), temperature source Rectal, resp. rate 17, height 5' 8 (1.727 m), weight 69.9 kg, SpO2 93%.   Labs: Glucose 140, BUN 37, creatinine 1.31, anion gap 19, creatinine 2.9, AST 334, ALT 75, total bilirubin 1.7, GFR 53, total CK 7000 422, troponin 113, 19, lactic acid 3, 2.3, WBC 19.8,Respiratory panel including;  RSV, influenza A/B COVID all negative, UA large hemoglobin, ketones, negative for bacteria, WBC 0-5, chest x-ray clean, CT head, chest, cervical spine, maxillofacial area follow-up reviewed negative for any acute traumatic findings.  Right renal calculi, the largest 7 mm, but no evidence of passing stone or hydroureteronephrosis. 4. Liquid stool in the colon but no evidence of obstruction or colitis. 5. Lumbar degenerative changes. Potential for at least moderate spinal stenosis at L2-3 and L3-4. ------------------------------------------------------------------------------------------------------------------ Hospital Course by listed problems addressed   Sepsis -- RULED OUT  - POA met SIRS criteria, with leukocytosis: WBC 19.8, creatinine 1.31, lactic acid 3.0,  - No source of infection, UA negative chest x-ray clean  - Presentation due to diarrhea, dehydration, lactic acidosis   Positive Blood Culture - Blood cultures from admission 09/15/2023 growing staph Caprae  - this is highly suspected to be a skin contaminant - Repeat blood cultures -no growth to date  -  remains hemodynamically stable   Acute metabolic encephalopathy - RESOLVED - Metabolic encephalopathy with confusion resolving with supportive care  - Improved mentation awake alert oriented x 3 CT head negative, MRI of the brain reviewed in detail-negative for any acute changes chronic microvascular changes   -History of TIA, continue  aspirin , with holding statins due to transaminitis, rhabdomyolysis   Falls Generalized weakness, status post fall -Severe debility and need for SNF documented -Per POA needs assist with all ADLs and   - PT OT recommending SNF    - S/p evaluation aside from superficial bruising no signs of traumatic laceration open wound - CT head cervical spine chest abdomen all reviewed, negative for any acute traumatic findings   CT head, chest, cervical spine, maxillofacial area follow-up reviewed negative for any acute traumatic findings.  Right renal calculi, the largest 7 mm, but no evidence of passing stone or hydroureteronephrosis. 4. Liquid stool in the colon but no evidence of obstruction or colitis. 5. Lumbar degenerative changes. Potential for at least moderate spinal stenosis at L2-3 and L3-4.   - Fall precautions recommended  - MRI of the brain-reviewed, chronic microvascular changes   Elevated troponin - Pt denies chest pain - Elevated troponin with elevated total CK likely ischemic demand -No acute changes in EKG - Troponin trending down - ACS ruled out  - As needed nitroglycerin, aspirin , analgesics   Transaminitis - Monitored LFTs and trending down - hepatitis panel negative for acute findings - Avoiding hepatotoxins   Lactic acidosis - Lactic acid 3.0>> 2.3 WBC 19.8 >> 12.6 - Afebrile hypertensive - Blood cultures are growing gram-positive cocci - treated with IV fluid hydration - likely due to severe dehydration    - UA-negative, chest x-ray within normal limits-no source of infection     Rhabdomyolysis - RESOLVING Due to poor wound mobility was found down and severe dehydration  - Total CK elevated to 7,422>>down to 701 with treatments  - With AKI with a creatinine of 1.31 - now resolved  -S/p aggressive IV fluid resuscitation   Type 2 diabetes mellitus - checking CBG q. ACH, SSI coverage - resumed home treatment at discharge CBG (last 3)  Recent Labs (last 2  labs)           Recent Labs    09/19/23 1602 09/19/23 2206 09/20/23 0722  GLUCAP 166* 121* 145*        Chronic constipation C/o loose stool-diarrhea -No further diarrhea episode overnight - Status post ingestion of possible multiple bottles of mag citrate at home -Withholding bowel regimen -Monitoring closely   Carotid artery stenosis - Denies any chest pain - Continue aspirin , holding statins (due to transaminitis)   Stage 2 chronic kidney disease Acute on chronic stage II CKD - BUN 37, GFR 53 - Continue IV fluid resuscitation -Avoid nephrotoxin, hypotension   Recent Labs           Lab Results  Component Value Date    CREATININE 0.95 09/20/2023    CREATININE 1.02 09/18/2023    CREATININE 1.12 09/17/2023      Glaucoma, both eyes Will continue eyedrop regimen including timolol , high-dose vitamin E    Essential hypertension - Resuming home medication of Norvasc  and metoprolol  - as needed hydralazine   Mixed hyperlipidemia - temporarily held home medication of Crestor  due to transaminitis, and rhabdomyolysis   Procedures performed: CT of the head/MRI of the brain, 2D echocardiogram Disposition: Skilled nursing facility Diet recommendation:  Discharge Diet Orders (From admission, onward)  Start     Ordered    09/19/23 0000   Diet - low sodium heart healthy        09/19/23 1025                Cardiac and Carb modified diet DISCHARGE MEDICATION: Allergies as of 09/21/2023         Reactions    Benicar Hct [olmesartan Medoxomil-hctz] Other (See Comments)    Renal damage    Benicar [olmesartan] Other (See Comments)    Unknown     Clarithromycin  Other (See Comments)    Unknown     Crestor  [rosuvastatin ] Other (See Comments)    Kidney problems    Doxycycline Other (See Comments)    Unknown    Hydralazine Other (See Comments)    Unknown     Levofloxacin Other (See Comments)    Severe headache    Levofloxacin Other (See Comments)    Unknown      Plavix  [clopidogrel ] Other (See Comments)    Caused confusion and constipation    Propoxyphene Other (See Comments)    With Acetaminophen , severe headache and trouble urinating    Tetracyclines & Related Other (See Comments)    Severe headache    Celecoxib Other (See Comments)    Confusion     Clarithromycin  Rash    Bad Headache    Combigan [brimonidine Tartrate-timolol ] Other (See Comments)    Eye irritation     Dorzolamide Other (See Comments)    Allergic reaction    Latanoprost Other (See Comments)    Allergic reaction    Metronidazole Other (See Comments)    Headache            Medication List       PAUSE taking these medications     rosuvastatin  5 MG tablet Wait to take this until: September 25, 2023 Commonly known as: CRESTOR  Take 5 mg by mouth daily.           STOP taking these medications     Alpha-Lipoic Acid 300 MG Caps    Berberine Chloride Powd    BLACK CHERRY CONCENTRATE PO    Triple Omega-3-6-9 Caps    Vitamin C  500 MG Caps           TAKE these medications     AMBULATORY NON FORMULARY MEDICATION Healthy Prostate  Take 1 tablet by mouth twice daily    amLODipine  10 MG tablet Commonly known as: NORVASC  Take 1 tablet (10 mg total) by mouth every evening.    aspirin  325 MG tablet Take 325 mg by mouth daily.    Co Q 10 100 MG Caps Take 1 capsule by mouth 2 (two) times daily.    docusate sodium  100 MG capsule Commonly known as: COLACE Take 1 capsule (100 mg total) by mouth 2 (two) times daily. What changed: when to take this    insulin  aspart 100 UNIT/ML injection Commonly known as: novoLOG  Inject 0-6 Units into the skin 3 (three) times daily with meals. CBG 70 - 120: 0 units CBG 121 - 150: 0 units CBG 151 - 200: 1 unit CBG 201-250: 2 units CBG 251-300: 3 units CBG 301-350: 4 units CBG 351-400: 5 units CBG > 400: Give 6 units and call MD    lactobacillus Pack Take 1 packet (1 g total) by mouth 3 (three) times daily with meals for 5  days.    Magnesium  500 MG Tabs Take 500 mg by mouth in the morning and  at bedtime.    melatonin 3 MG Tabs tablet Take 2 tablets (6 mg total) by mouth at bedtime.    metoprolol  tartrate 50 MG tablet Commonly known as: LOPRESSOR  Take 1 tablet (50 mg total) by mouth 2 (two) times daily.    multivitamin with minerals Tabs tablet Take 1 tablet by mouth daily.    niacin  1000 MG CR tablet Commonly known as: NIASPAN  Take 1,000 mg by mouth at bedtime.    timolol  0.25 % ophthalmic solution Commonly known as: TIMOPTIC  Place 1 drop into both eyes 2 (two) times daily.    traZODone  50 MG tablet Commonly known as: DESYREL  Take 1 tablet (50 mg total) by mouth at bedtime.    Vitamin B12 3000 MCG/ML Liqd Place 1 tablet under the tongue daily.    Vitamin D3 125 MCG (5000 UT) Tabs Take 1 tablet by mouth daily.    vitamin E  180 MG (400 UNITS) capsule Take 400 Units by mouth daily.    Vyzulta  0.024 % Soln Generic drug: Latanoprostene Bunod  Place 1 drop into both eyes at bedtime.             Contact information for after-discharge care       Destination       [facility] .   Service: Skilled Nursing Contact information: [address] [facility] [place]  [address] [phone]                             Discharge Exam:      Stephen [name]    09/18/23 0500 09/19/23 0500 09/20/23 0524  Weight: 77.6 kg 75 kg 73.9 kg            General:  AAO x 2,  cooperative, no distress;   HEENT:  Normocephalic, PERRL, otherwise with in Normal limits   Neuro:  CNII-XII intact. , normal motor and sensation, reflexes intact   Lungs:   Clear to auscultation BL, Respirations unlabored,  No wheezes / crackles  Cardio:    S1/S2, RRR, No murmure, No Rubs or Gallops   Abdomen:  Soft, non-tender, bowel sounds active all four quadrants, no guarding or peritoneal signs.  Muscular  skeletal:  Limited exam -global generalized weaknesses - in bed, able to move all 4  extremities,   2+ pulses,  symmetric, No pitting edema  Skin:  Dry, warm to touch, negative for any Rashes,  Wounds: Please see nursing documentation      Condition at discharge: fair, STABLE    The results of significant diagnostics from this hospitalization (including imaging, microbiology, ancillary and laboratory) are listed below for reference.    Imaging Studies:  Imaging Results  ECHOCARDIOGRAM COMPLETE Result Date: 09/18/2023    ECHOCARDIOGRAM REPORT   Patient Name:   Stephen [name] Date of Exam: 09/18/2023 Medical Rec #:  [id number]        Height:       68.0 in Accession #:    [id number]       Weight:       171.1 lb Date of Birth:  [birth year]         BSA:          1.912 m Patient Age:    88 years         BP:           171/73 mmHg Patient Gender: M  HR:           82 bpm. Exam Location:  [facility] Procedure: 2D Echo, Cardiac Doppler and Color Doppler (Both Spectral and Color            Flow Doppler were utilized during procedure). Indications:    Bacteremia R78.81  History:        Patient has prior history of Echocardiogram examinations, most                 recent 07/02/2013. CAD, TIA; Risk Factors:Hypertension, Diabetes                 and Dyslipidemia.  Sonographer:    Aida Pizza RCS Referring Phys: [phone] SEYED A SHAHMEHDI IMPRESSIONS  1. Left ventricular ejection fraction, by estimation, is >75%. The left ventricle has hyperdynamic function. The left ventricle has no regional wall motion abnormalities. Left ventricular diastolic parameters are indeterminate.  2. Right ventricular systolic function is normal. The right ventricular size is normal.  3. Left atrial size was mildly dilated.  4. The mitral valve is normal in structure. No evidence of mitral valve regurgitation. No evidence of mitral stenosis.  5. The aortic valve is tricuspid. Aortic valve regurgitation is mild. No aortic stenosis is present.  6. The inferior vena cava is normal in size with greater than 50%  respiratory variability, suggesting right atrial pressure of 3 mmHg. FINDINGS  Left Ventricle: Left ventricular ejection fraction, by estimation, is >75%. The left ventricle has hyperdynamic function. The left ventricle has no regional wall motion abnormalities. The left ventricular internal cavity size was normal in size. There is no left ventricular hypertrophy. Left ventricular diastolic parameters are indeterminate. Right Ventricle: The right ventricular size is normal. Right vetricular wall thickness was not well visualized. Right ventricular systolic function is normal. Left Atrium: Left atrial size was mildly dilated. Right Atrium: Right atrial size was normal in size. Pericardium: There is no evidence of pericardial effusion. Mitral Valve: The mitral valve is normal in structure. No evidence of mitral valve regurgitation. No evidence of mitral valve stenosis. Tricuspid Valve: The tricuspid valve is normal in structure. Tricuspid valve regurgitation is not demonstrated. No evidence of tricuspid stenosis. Aortic Valve: The aortic valve is tricuspid. Aortic valve regurgitation is mild. No aortic stenosis is present. Aortic valve mean gradient measures 6.0 mmHg. Aortic valve peak gradient measures 13.1 mmHg. Aortic valve area, by VTI measures 2.31 cm. Pulmonic Valve: The pulmonic valve was not well visualized. Pulmonic valve regurgitation is not visualized. No evidence of pulmonic stenosis. Aorta: The aortic root is normal in size and structure and the ascending aorta was not well visualized. Venous: The inferior vena cava is normal in size with greater than 50% respiratory variability, suggesting right atrial pressure of 3 mmHg. IAS/Shunts: No atrial level shunt detected by color flow Doppler.  LEFT VENTRICLE PLAX 2D LVIDd:         3.90 cm   Diastology LVIDs:         1.90 cm   LV e' medial:    8.70 cm/s LV PW:         1.00 cm   LV E/e' medial:  14.9 LV IVS:        1.00 cm   LV e' lateral:   10.80 cm/s LVOT  diam:     1.90 cm   LV E/e' lateral: 12.0 LV SV:         82 LV SV Index:   43 LVOT Area:  2.84 cm  RIGHT VENTRICLE RV S prime:     18.52 cm/s TAPSE (M-mode): 3.2 cm LEFT ATRIUM             Index        RIGHT ATRIUM           Index LA diam:        3.40 cm 1.78 cm/m   RA Area:     17.70 cm LA Vol (A2C):   93.6 ml 48.94 ml/m  RA Volume:   45.60 ml  23.84 ml/m LA Vol (A4C):   53.1 ml 27.77 ml/m LA Biplane Vol: 73.8 ml 38.59 ml/m  AORTIC VALVE AV Area (Vmax):    2.12 cm AV Area (Vmean):   2.29 cm AV Area (VTI):     2.31 cm AV Vmax:           181.00 cm/s AV Vmean:          107.000 cm/s AV VTI:            0.356 m AV Peak Grad:      13.1 mmHg AV Mean Grad:      6.0 mmHg LVOT Vmax:         135.50 cm/s LVOT Vmean:        86.400 cm/s LVOT VTI:          0.290 m LVOT/AV VTI ratio: 0.82  AORTA Ao Root diam: 3.80 cm MITRAL VALVE MV Area (PHT): 3.12 cm     SHUNTS MV Decel Time: 243 msec     Systemic VTI:  0.29 m MV E velocity: 130.00 cm/s  Systemic Diam: 1.90 cm MV A velocity: 134.00 cm/s MV E/A ratio:  0.97 Dorn Ross MD Electronically signed by Dorn Ross MD Signature Date/Time: 09/18/2023/6:22:01 PM    Final     MR BRAIN WO CONTRAST Result Date: 09/16/2023 CLINICAL DATA:  88 year old male with altered mental status. Found down. EXAM: MRI HEAD WITHOUT CONTRAST TECHNIQUE: Multiplanar, multiecho pulse sequences of the brain and surrounding structures were obtained without intravenous contrast. COMPARISON:  CT head, face, cervical spine the same day reported separately. Brain MRI 11/12/2019. FINDINGS: Brain: Stable cerebral volume from the 2021 MRI. No restricted diffusion to suggest acute infarction. No midline shift, mass effect, evidence of mass lesion, ventriculomegaly, extra-axial collection or acute intracranial hemorrhage. Cervicomedullary junction and pituitary are within normal limits. Patchy and confluent cerebral white matter T2 and FLAIR hyperintensity with pronounced chronic involvement of the  right corona radiata, extending to subcortical white matter there (series 16, image 18). Acute corona radiata lacunar infarct was present in in this region on 2021 exam. The regional white matter T2 and FLAIR hyperintensity has progressed since that time. No cortical encephalomalacia identified. No convincing chronic cerebral blood products on SWI. Outside of the right corona radiata white matter signal changes are stable since 2021 and mild for age. Mild for age T2 heterogeneity in the bilateral basal ganglia. Thalami, brainstem, cerebellum appear negative. Vascular: Major intracranial vascular flow voids are preserved, stable since 2021. Skull and upper cervical spine: Visualized bone marrow signal is within normal limits. Partially visible chronic cervical spine degeneration. Sinuses/Orbits: Stable since 2021, negative. Other: Mild chronic right mastoid effusion. Otherwise grossly normal visible internal auditory structures. Negative visible scalp and face. IMPRESSION: 1. No acute intracranial abnormality. 2. Pronounced chronic small-vessel ischemia in the right corona radiata and regional subcortical white matter. Progression there since a 2021 MRI. But mild for age small-vessel disease changes otherwise. Electronically Signed  By: VEAR Hurst M.D.   On: 09/16/2023 06:44    DG Shoulder Left Result Date: 09/15/2023 CLINICAL DATA:  Fall EXAM: LEFT SHOULDER - 2+ VIEW COMPARISON:  None available FINDINGS: Irregularity of the greater tuberosity indicative of rotator cuff tendinopathy. Mild degenerative changes of the glenohumeral joint. No fracture or dislocation. IMPRESSION: No acute abnormality of the LEFT shoulder. Electronically Signed   By: Aliene Lloyd M.D.   On: 09/15/2023 12:35    CT Head Wo Contrast Result Date: 09/15/2023 CLINICAL DATA:  Found on the floor.  Altered mental status. EXAM: CT HEAD WITHOUT CONTRAST CT MAXILLOFACIAL WITHOUT CONTRAST CT CERVICAL SPINE WITHOUT CONTRAST TECHNIQUE: Multidetector  CT imaging of the head, cervical spine, and maxillofacial structures were performed using the standard protocol without intravenous contrast. Multiplanar CT image reconstructions of the cervical spine and maxillofacial structures were also generated. RADIATION DOSE REDUCTION: This exam was performed according to the departmental dose-optimization program which includes automated exposure control, adjustment of the mA and/or kV according to patient size and/or use of iterative reconstruction technique. COMPARISON:  11/12/2019 FINDINGS: CT HEAD FINDINGS Brain: Age related volume loss. Chronic small-vessel ischemic changes of the white matter. No sign of acute infarction, mass lesion, hemorrhage, hydrocephalus or extra-axial collection. Vascular: There is atherosclerotic calcification of the major vessels at the base of the brain. Skull: Negative Other: None CT MAXILLOFACIAL FINDINGS Osseous: No facial fracture. Orbits: No intraorbital injury. Sinuses: Sinuses are clear. Soft tissues: Mild soft tissue swelling of the face. CT CERVICAL SPINE FINDINGS Alignment: No malalignment. Skull base and vertebrae: No regional fracture. Solid bridging anterior osteophytes from C5 through C7. Soft tissues and spinal canal: No traumatic soft tissue finding. 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 Mild osteoarthritis at the C1-2 articulation. C2-3: Facet osteoarthritis on the right. Moderate right foraminal stenosis. C3-4: Endplate and uncovertebral osteophytes more prominent on the left. Bilateral facet degeneration worse on the left. Left foraminal stenosis that could cause neural compression. C4-5: Endplate and uncovertebral osteophytes worse on the right. Facet degeneration worse on the right. Right foraminal stenosis that could cause neural compression. C5 through C7: Fusion of the vertebral bodies. No compressive canal stenosis. Mild bilateral chronic bony foraminal  narrowing. C7-T1: Anterior osteophytes but without complete solid union. No stenosis of the canal or foramina. Upper chest: Negative Other: IMPRESSION: HEAD CT: No acute or traumatic finding. Age related volume loss and chronic small-vessel ischemic changes of the white matter. MAXILLOFACIAL CT: No facial fracture. Mild soft tissue swelling of the face. CERVICAL SPINE CT: No acute or traumatic finding. Chronic degenerative changes as outlined above. Electronically Signed   By: Oneil Officer M.D.   On: 09/15/2023 11:25    CT Cervical Spine Wo Contrast Result Date: 09/15/2023 CLINICAL DATA:  Found on the floor.  Altered mental status. EXAM: CT HEAD WITHOUT CONTRAST CT MAXILLOFACIAL WITHOUT CONTRAST CT CERVICAL SPINE WITHOUT CONTRAST TECHNIQUE: Multidetector CT imaging of the head, cervical spine, and maxillofacial structures were performed using the standard protocol without intravenous contrast. Multiplanar CT image reconstructions of the cervical spine and maxillofacial structures were also generated. RADIATION DOSE REDUCTION: This exam was performed according to the departmental dose-optimization program which includes automated exposure control, adjustment of the mA and/or kV according to patient size and/or use of iterative reconstruction technique. COMPARISON:  11/12/2019 FINDINGS: CT HEAD FINDINGS Brain: Age related volume loss. Chronic small-vessel ischemic changes of the white matter. No sign of acute infarction, mass  lesion, hemorrhage, hydrocephalus or extra-axial collection. Vascular: There is atherosclerotic calcification of the major vessels at the base of the brain. Skull: Negative Other: None CT MAXILLOFACIAL FINDINGS Osseous: No facial fracture. Orbits: No intraorbital injury. Sinuses: Sinuses are clear. Soft tissues: Mild soft tissue swelling of the face. CT CERVICAL SPINE FINDINGS Alignment: No malalignment. Skull base and vertebrae: No regional fracture. Solid bridging anterior osteophytes from  C5 through C7. Soft tissues and spinal canal: No traumatic soft tissue finding. 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 Mild osteoarthritis at the C1-2 articulation. C2-3: Facet osteoarthritis on the right. Moderate right foraminal stenosis. C3-4: Endplate and uncovertebral osteophytes more prominent on the left. Bilateral facet degeneration worse on the left. Left foraminal stenosis that could cause neural compression. C4-5: Endplate and uncovertebral osteophytes worse on the right. Facet degeneration worse on the right. Right foraminal stenosis that could cause neural compression. C5 through C7: Fusion of the vertebral bodies. No compressive canal stenosis. Mild bilateral chronic bony foraminal narrowing. C7-T1: Anterior osteophytes but without complete solid union. No stenosis of the canal or foramina. Upper chest: Negative Other: IMPRESSION: HEAD CT: No acute or traumatic finding. Age related volume loss and chronic small-vessel ischemic changes of the white matter. MAXILLOFACIAL CT: No facial fracture. Mild soft tissue swelling of the face. CERVICAL SPINE CT: No acute or traumatic finding. Chronic degenerative changes as outlined above. Electronically Signed   By: Oneil Officer M.D.   On: 09/15/2023 11:25    CT Maxillofacial WO CM Result Date: 09/15/2023 CLINICAL DATA:  Found on the floor.  Altered mental status. EXAM: CT HEAD WITHOUT CONTRAST CT MAXILLOFACIAL WITHOUT CONTRAST CT CERVICAL SPINE WITHOUT CONTRAST TECHNIQUE: Multidetector CT imaging of the head, cervical spine, and maxillofacial structures were performed using the standard protocol without intravenous contrast. Multiplanar CT image reconstructions of the cervical spine and maxillofacial structures were also generated. RADIATION DOSE REDUCTION: This exam was performed according to the departmental dose-optimization program which includes automated exposure control, adjustment of the mA  and/or kV according to patient size and/or use of iterative reconstruction technique. COMPARISON:  11/12/2019 FINDINGS: CT HEAD FINDINGS Brain: Age related volume loss. Chronic small-vessel ischemic changes of the white matter. No sign of acute infarction, mass lesion, hemorrhage, hydrocephalus or extra-axial collection. Vascular: There is atherosclerotic calcification of the major vessels at the base of the brain. Skull: Negative Other: None CT MAXILLOFACIAL FINDINGS Osseous: No facial fracture. Orbits: No intraorbital injury. Sinuses: Sinuses are clear. Soft tissues: Mild soft tissue swelling of the face. CT CERVICAL SPINE FINDINGS Alignment: No malalignment. Skull base and vertebrae: No regional fracture. Solid bridging anterior osteophytes from C5 through C7. Soft tissues and spinal canal: No traumatic soft tissue finding. 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 Mild osteoarthritis at the C1-2 articulation. C2-3: Facet osteoarthritis on the right. Moderate right foraminal stenosis. C3-4: Endplate and uncovertebral osteophytes more prominent on the left. Bilateral facet degeneration worse on the left. Left foraminal stenosis that could cause neural compression. C4-5: Endplate and uncovertebral osteophytes worse on the right. Facet degeneration worse on the right. Right foraminal stenosis that could cause neural compression. C5 through C7: Fusion of the vertebral bodies. No compressive canal stenosis. Mild bilateral chronic bony foraminal narrowing. C7-T1: Anterior osteophytes but without complete solid union. No stenosis of the canal or foramina. Upper chest: Negative Other: IMPRESSION: HEAD CT: No acute or traumatic finding. Age  related volume loss and chronic small-vessel ischemic changes of the white matter. MAXILLOFACIAL CT: No facial fracture. Mild soft tissue swelling of the face. CERVICAL SPINE CT: No acute or traumatic finding. Chronic  degenerative changes as outlined above. Electronically Signed   By: Oneil Officer M.D.   On: 09/15/2023 11:25    CT CHEST ABDOMEN PELVIS W CONTRAST Result Date: 09/15/2023 CLINICAL DATA:  Found on the floor. Loss of bowel control. Left-sided facial swelling. EXAM: CT CHEST, ABDOMEN, AND PELVIS WITH CONTRAST TECHNIQUE: Multidetector CT imaging of the chest, abdomen and pelvis was performed following the standard protocol during bolus administration of intravenous contrast. RADIATION DOSE REDUCTION: This exam was performed according to the departmental dose-optimization program which includes automated exposure control, adjustment of the mA and/or kV according to patient size and/or use of iterative reconstruction technique. CONTRAST:  OMNIPAQUE  IOHEXOL  300 MG/ML  SOLN COMPARISON:  None Available. FINDINGS: CT CHEST FINDINGS Cardiovascular: Normal heart size. No pericardial fluid. Some coronary artery calcification is present. Some aortic atherosclerotic calcification is present. No evidence of aneurysm. Mediastinum/Nodes: No mass or adenopathy. Lungs/Pleura: No pleural disease. No underlying emphysema. No mass. No nodule in need of further follow-up. Benign 2 mm subpleural nodule in the posterior right upper lobe. Musculoskeletal: No regional fracture. Chronic spinal degenerative changes with bridging osteophytes. CT ABDOMEN PELVIS FINDINGS Hepatobiliary: Normal Pancreas: Normal Spleen: Normal Adrenals/Urinary Tract: Adrenal glands are normal. Mild atrophic change of the left kidney without focal finding or obstruction. Right kidney is normal size and contains multiple stones, the largest 7 mm, but no evidence of passing stone or hydroureteronephrosis. Bladder appears normal. Stomach/Bowel: Stomach and small intestine are unremarkable. Normal appendix. Liquid stool in the colon but no evidence of obstruction or colitis. Vascular/Lymphatic: No lymphadenopathy. Aortic atherosclerosis without aneurysm.  Reproductive: Normal Other: No free fluid or air. Musculoskeletal: Lumbar degenerative changes. Potential for at least moderate spinal stenosis at L2-3 and L3-4. IMPRESSION: 1. No acute or traumatic finding. 2. Aortic atherosclerosis. Coronary artery calcification. 3. Right renal calculi, the largest 7 mm, but no evidence of passing stone or hydroureteronephrosis. 4. Liquid stool in the colon but no evidence of obstruction or colitis. 5. Lumbar degenerative changes. Potential for at least moderate spinal stenosis at L2-3 and L3-4. Aortic Atherosclerosis (ICD10-I70.0). Electronically Signed   By: Oneil Officer M.D.   On: 09/15/2023 11:20    DG Abdomen 1 View Result Date: 09/13/2023 CLINICAL DATA:  Constipation/fecal impaction. EXAM: ABDOMEN - 1 VIEW COMPARISON:  10/22/2021. FINDINGS: The bowel gas pattern is non-obstructive.  No abnormal stool burden. No evidence of pneumoperitoneum. No acute osseous abnormalities. The soft tissues are within normal limits. Surgical changes, devices, tubes and lines: None. IMPRESSION: Nonobstructive bowel gas pattern. No abnormal stool burden. Electronically Signed   By: Ree Molt M.D.   On: 09/13/2023 17:44       Microbiology: >         Results for orders placed or performed during the hospital encounter of 09/15/23  Culture, blood (routine x 2)     Status: Abnormal    Collection Time: 09/15/23  9:05 AM    Specimen: BLOOD  Result Value Ref Range Status    Specimen Description     Final      BLOOD LEFT ARM Performed at [facility], [address], [place], [place] [address]      Special Requests     Final      BOTTLES DRAWN AEROBIC AND ANAEROBIC Blood Culture adequate volume Performed  at [facility], [address], [place], [place] [address]      Culture  Setup Time     Final      GRAM POSITIVE COCCI AEROBIC BOTTLE ONLY Gram Stain Report Called to,Read Back By and Verified With: ASHLEY S. ON 09/16/2023 @10 :28 BY T. HAMER  CRITICAL RESULT CALLED TO, READ  BACK BY AND VERIFIED WITH: RN FREDRIK BERRY [id number] AT 1837 BY EC      Culture (A)   Final      STAPHYLOCOCCUS CAPRAE THE SIGNIFICANCE OF ISOLATING THIS ORGANISM FROM A SINGLE SET OF BLOOD CULTURES WHEN MULTIPLE SETS ARE DRAWN IS UNCERTAIN. PLEASE NOTIFY THE MICROBIOLOGY DEPARTMENT WITHIN ONE WEEK IF SPECIATION AND SENSITIVITIES ARE REQUIRED. Performed at [facility], [address], [place], [place] [address]      Report Status 09/18/2023 FINAL   Final  Blood Culture ID Panel (Reflexed)     Status: Abnormal    Collection Time: 09/15/23  9:05 AM  Result Value Ref Range Status    Enterococcus faecalis NOT DETECTED NOT DETECTED Final    Enterococcus Faecium NOT DETECTED NOT DETECTED Final    Listeria monocytogenes NOT DETECTED NOT DETECTED Final    Staphylococcus species DETECTED (A) NOT DETECTED Final      Comment: CRITICAL RESULT CALLED TO, READ BACK BY AND VERIFIED WITH: RN FREDRIK BERRY [id number] AT 1837 BY EC      Staphylococcus aureus (BCID) NOT DETECTED NOT DETECTED Final    Staphylococcus epidermidis NOT DETECTED NOT DETECTED Final    Staphylococcus lugdunensis NOT DETECTED NOT DETECTED Final    Streptococcus species NOT DETECTED NOT DETECTED Final    Streptococcus agalactiae NOT DETECTED NOT DETECTED Final    Streptococcus pneumoniae NOT DETECTED NOT DETECTED Final    Streptococcus pyogenes NOT DETECTED NOT DETECTED Final    A.calcoaceticus-baumannii NOT DETECTED NOT DETECTED Final    Bacteroides fragilis NOT DETECTED NOT DETECTED Final    Enterobacterales NOT DETECTED NOT DETECTED Final    Enterobacter cloacae complex NOT DETECTED NOT DETECTED Final    Escherichia coli NOT DETECTED NOT DETECTED Final    Klebsiella aerogenes NOT DETECTED NOT DETECTED Final    Klebsiella oxytoca NOT DETECTED NOT DETECTED Final    Klebsiella pneumoniae NOT DETECTED NOT DETECTED Final    Proteus species NOT DETECTED NOT DETECTED Final    Salmonella species NOT DETECTED NOT DETECTED Final     Serratia marcescens NOT DETECTED NOT DETECTED Final    Haemophilus influenzae NOT DETECTED NOT DETECTED Final    Neisseria meningitidis NOT DETECTED NOT DETECTED Final    Pseudomonas aeruginosa NOT DETECTED NOT DETECTED Final    Stenotrophomonas maltophilia NOT DETECTED NOT DETECTED Final    Candida albicans NOT DETECTED NOT DETECTED Final    Candida auris NOT DETECTED NOT DETECTED Final    Candida glabrata NOT DETECTED NOT DETECTED Final    Candida krusei NOT DETECTED NOT DETECTED Final    Candida parapsilosis NOT DETECTED NOT DETECTED Final    Candida tropicalis NOT DETECTED NOT DETECTED Final    Cryptococcus neoformans/gattii NOT DETECTED NOT DETECTED Final      Comment: Performed at [facility], [address], [place], [place] [address]  Culture, blood (routine x 2)     Status: None    Collection Time: 09/15/23  9:58 AM    Specimen: BLOOD  Result Value Ref Range Status    Specimen Description BLOOD BLOOD RIGHT HAND   Final    Special  Requests     Final      BOTTLES DRAWN AEROBIC AND ANAEROBIC Blood Culture adequate volume    Culture     Final      NO GROWTH 5 DAYS Performed at [facility], [address], [place], [place] [address]      Report Status 09/20/2023 FINAL   Final  Resp panel by RT-PCR (RSV, Flu A&B, Covid) Anterior Nasal Swab     Status: None    Collection Time: 09/15/23 11:17 AM    Specimen: Anterior Nasal Swab  Result Value Ref Range Status    SARS Coronavirus 2 by RT PCR NEGATIVE NEGATIVE Final      Comment: (NOTE) SARS-CoV-2 target nucleic acids are NOT DETECTED.   The SARS-CoV-2 RNA is generally detectable in upper respiratory specimens during the acute phase of infection. The lowest concentration of SARS-CoV-2 viral copies this assay can detect is 138 copies/mL. A negative result does not preclude SARS-Cov-2 infection and should not be used as the sole basis for treatment or other patient management decisions. A negative result may occur with   improper specimen collection/handling, submission of specimen other than nasopharyngeal swab, presence of viral mutation(s) within the areas targeted by this assay, and inadequate number of viral copies(<138 copies/mL). A negative result must be combined with clinical observations, patient history, and epidemiological information. The expected result is Negative.   Fact Sheet for Patients:  BloggerCourse.com   Fact Sheet for Healthcare Providers:  SeriousBroker.it   This test is no t yet approved or cleared by the [place]  FDA and  has been authorized for detection and/or diagnosis of SARS-CoV-2 by FDA under an Emergency Use Authorization (EUA). This EUA will remain  in effect (meaning this test can be used) for the duration of the COVID-19 declaration under Section 564(b)(1) of the Act, 21 U.S.C.section 360bbb-3(b)(1), unless the authorization is terminated  or revoked sooner.           Influenza A by PCR NEGATIVE NEGATIVE Final    Influenza B by PCR NEGATIVE NEGATIVE Final      Comment: (NOTE) The Xpert Xpress SARS-CoV-2/FLU/RSV plus assay is intended as an aid in the diagnosis of influenza from Nasopharyngeal swab specimens and should not be used as a sole basis for treatment. Nasal washings and aspirates are unacceptable for Xpert Xpress SARS-CoV-2/FLU/RSV testing.   Fact Sheet for Patients: BloggerCourse.com   Fact Sheet for Healthcare Providers: SeriousBroker.it   This test is not yet approved or cleared by the [place]  FDA and has been authorized for detection and/or diagnosis of SARS-CoV-2 by FDA under an Emergency Use Authorization (EUA). This EUA will remain in effect (meaning this test can be used) for the duration of the COVID-19 declaration under Section 564(b)(1) of the Act, 21 U.S.C. section 360bbb-3(b)(1), unless the authorization is terminated  or revoked.        Resp Syncytial Virus by PCR NEGATIVE NEGATIVE Final      Comment: (NOTE) Fact Sheet for Patients: BloggerCourse.com   Fact Sheet for Healthcare Providers: SeriousBroker.it   This test is not yet approved or cleared by the [place]  FDA and has been authorized for detection and/or diagnosis of SARS-CoV-2 by FDA under an Emergency Use Authorization (EUA). This EUA will remain in effect (meaning this test can be used) for the duration of the COVID-19 declaration under Section 564(b)(1) of the Act, 21 U.S.C. section 360bbb-3(b)(1), unless the authorization is terminated or revoked.   Performed at [facility], [phone]  [address], [place], [place] [address]    MRSA Next Gen by PCR, Nasal     Status: None    Collection Time: 09/16/23 12:00 AM    Specimen: Nasal Mucosa; Nasal Swab  Result Value Ref Range Status    MRSA by PCR Next Gen NOT DETECTED NOT DETECTED Final      Comment: (NOTE) The GeneXpert MRSA Assay (FDA approved for NASAL specimens only), is one component of a comprehensive MRSA colonization surveillance program. It is not intended to diagnose MRSA infection nor to guide or monitor treatment for MRSA infections. Test performance is not FDA approved in patients less than 86 years old. Performed at [facility], [address] [place], [place] [address]    Culture, blood (Routine X 2) w Reflex to ID Panel     Status: None (Preliminary result)    Collection Time: 09/17/23 12:20 AM    Specimen: BLOOD RIGHT ARM  Result Value Ref Range Status    Specimen Description BLOOD RIGHT ARM   Final    Special Requests     Final      BOTTLES DRAWN AEROBIC AND ANAEROBIC Blood Culture adequate volume    Culture     Final      NO GROWTH 4 DAYS Performed at [facility], [address] [place], [place] [address]      Report Status PENDING   Incomplete  Culture, blood (Routine X 2) w Reflex to ID Panel     Status: None  (Preliminary result)    Collection Time: 09/17/23 12:23 AM    Specimen: BLOOD RIGHT HAND  Result Value Ref Range Status    Specimen Description BLOOD RIGHT HAND   Final    Special Requests AEROBIC BOTTLE ONLY Blood Culture adequate volume   Final    Culture     Final      NO GROWTH 4 DAYS Performed at [facility], [address] [place], [place] [address]      Report Status PENDING   Incomplete        Labs: CBC: Last Labs         Recent Labs  Lab 09/15/23 0905 09/16/23 0912 09/17/23 0023 09/18/23 0443 09/19/23 0413  WBC 19.8* 12.6* 10.2 8.6 9.5  NEUTROABS 16.0*  --   --   --   --   HGB 16.2 14.2 12.7* 13.1 13.9  HCT 46.3 41.0 36.2* 38.1* 40.1  MCV 93.7 94.5 94.3 94.8 95.0  PLT 175 142* 138* 150 169      Basic Metabolic Panel: Last Labs          Recent Labs  Lab 09/15/23 0905 09/15/23 1223 09/16/23 0912 09/17/23 0023 09/18/23 0443 09/20/23 0406  NA 139  --  135 138 138 139  K 4.5  --  3.9 4.1 3.6 3.4*  CL 99  --  101 102 105 103  CO2 21*  --  22 24 23 22   GLUCOSE 140*  --  111* 111* 101* 151*  BUN 37*  --  30* 29* 17 23  CREATININE 1.31*  --  1.09 1.12 1.02 0.95  CALCIUM  9.3  --  8.6* 8.6* 8.5* 8.4*  MG 2.9* 2.8*  --   --   --   --   PHOS  --  4.0  --   --   --   --       Liver Function Tests: Last Labs         Recent Labs  Lab 09/15/23 0905 09/16/23 0912 09/17/23 0023 09/18/23 0443 09/20/23 0406  AST 334* 202* 151* 114* 43*  ALT 75* 70* 68* 68* 51*  ALKPHOS 81 64 57 55 56  BILITOT 1.7* 0.9 0.9 1.0 1.0  PROT 7.3 6.3* 5.6* 5.5* 5.4*  ALBUMIN 3.8 3.2* 2.8* 2.5* 2.6*      CBG: Last Labs         Recent Labs  Lab 09/19/23 2206 09/20/23 0722 09/20/23 1128 09/20/23 1700 09/21/23 0735  GLUCAP 121* 145* 147* 187* 147*        Discharge time spent: greater than 40 minutes.   Signed: Afton Louder, MD [facility] 09/21/2023      Revision History  Routing History

## 2023-10-15 DIAGNOSIS — R7881 Bacteremia: Secondary | ICD-10-CM | POA: Diagnosis not present

## 2023-10-15 DIAGNOSIS — R296 Repeated falls: Secondary | ICD-10-CM | POA: Diagnosis not present

## 2023-10-15 DIAGNOSIS — R531 Weakness: Secondary | ICD-10-CM | POA: Diagnosis not present

## 2023-10-15 DIAGNOSIS — E7849 Other hyperlipidemia: Secondary | ICD-10-CM | POA: Diagnosis not present

## 2023-10-15 DIAGNOSIS — M6282 Rhabdomyolysis: Secondary | ICD-10-CM | POA: Diagnosis not present

## 2023-10-15 DIAGNOSIS — E1143 Type 2 diabetes mellitus with diabetic autonomic (poly)neuropathy: Secondary | ICD-10-CM | POA: Diagnosis not present

## 2023-10-15 DIAGNOSIS — Z8673 Personal history of transient ischemic attack (TIA), and cerebral infarction without residual deficits: Secondary | ICD-10-CM | POA: Diagnosis not present

## 2023-11-13 NOTE — Nursing Note (Signed)
 Pt is alert and orientedx 3 with intermittent confusion noted at times, verbalizes needs to staff, VS WNL, denies pain or discomfort at this time, maneuvers self through facility hallways via w/c, participates with therapy, no s/s of distress, call light within reach

## 2023-11-14 DIAGNOSIS — R7881 Bacteremia: Secondary | ICD-10-CM | POA: Diagnosis not present

## 2023-11-14 DIAGNOSIS — R531 Weakness: Secondary | ICD-10-CM | POA: Diagnosis not present

## 2023-11-14 DIAGNOSIS — M6282 Rhabdomyolysis: Secondary | ICD-10-CM | POA: Diagnosis not present

## 2023-11-14 DIAGNOSIS — R296 Repeated falls: Secondary | ICD-10-CM | POA: Diagnosis not present

## 2023-11-14 DIAGNOSIS — E1143 Type 2 diabetes mellitus with diabetic autonomic (poly)neuropathy: Secondary | ICD-10-CM | POA: Diagnosis not present

## 2023-11-14 DIAGNOSIS — Z8673 Personal history of transient ischemic attack (TIA), and cerebral infarction without residual deficits: Secondary | ICD-10-CM | POA: Diagnosis not present

## 2023-11-14 DIAGNOSIS — E7849 Other hyperlipidemia: Secondary | ICD-10-CM | POA: Diagnosis not present

## 2023-11-20 DIAGNOSIS — R2689 Other abnormalities of gait and mobility: Secondary | ICD-10-CM | POA: Diagnosis not present

## 2023-11-20 DIAGNOSIS — M6259 Muscle wasting and atrophy, not elsewhere classified, multiple sites: Secondary | ICD-10-CM | POA: Diagnosis not present

## 2023-11-20 DIAGNOSIS — M62522 Muscle wasting and atrophy, not elsewhere classified, left upper arm: Secondary | ICD-10-CM | POA: Diagnosis not present

## 2023-11-20 DIAGNOSIS — Z741 Need for assistance with personal care: Secondary | ICD-10-CM | POA: Diagnosis not present

## 2023-11-21 DIAGNOSIS — M62522 Muscle wasting and atrophy, not elsewhere classified, left upper arm: Secondary | ICD-10-CM | POA: Diagnosis not present

## 2023-11-21 DIAGNOSIS — Z741 Need for assistance with personal care: Secondary | ICD-10-CM | POA: Diagnosis not present

## 2023-11-21 DIAGNOSIS — R2689 Other abnormalities of gait and mobility: Secondary | ICD-10-CM | POA: Diagnosis not present

## 2023-11-21 DIAGNOSIS — M6259 Muscle wasting and atrophy, not elsewhere classified, multiple sites: Secondary | ICD-10-CM | POA: Diagnosis not present

## 2023-12-06 DIAGNOSIS — F01518 Vascular dementia, unspecified severity, with other behavioral disturbance: Secondary | ICD-10-CM | POA: Diagnosis not present

## 2023-12-06 DIAGNOSIS — L89892 Pressure ulcer of other site, stage 2: Secondary | ICD-10-CM | POA: Diagnosis not present

## 2023-12-06 NOTE — Progress Notes (Signed)
 Stephen [name] 88 y.o. Feb 04, 1935 Phone: There are no phone numbers on file. Address: [address] [place] [address]  MRN: [id number] Primary MD : Yancey Elden Reno  [facility]   Problem List Items Addressed This Visit       Other   Vascular dementia with behavior disturbance (CMS-HCC)   Other Visit Diagnoses       Pressure injury of toe of left foot, stage 2 (CMS-HCC)    -  Primary      Patient with a healing left great toe stage 2 pressure injury.  Start aquacel ag, kerlix and ACE.  Change MWF.  Follow up 12/27/23.  Preoperative diagnosis: Left great toe scab  Postoperative diagnosis: Left great toe pressure injury, stage 2  Procedure performed: Paring of a left great toe callus, excisional debridement of biofilm from left great toe  Anesthesia: None  Surgeon: Duwaine Jumper, MD  Procedure performed in detail:   After informed consent was obtained, a 15 blade was used to pare back the callus of the left great toe and then to remove the biofilm from the ~0.7 x 0.4 cm wound.  Patient tolerated procedure well  Wound Care Visit  Past Medical History[1] Past Surgical History[2] Allergies[3]  Meds: Current Medications[4]  SocHx:    FamHx: has no family status information on file.    Review of Systems A 12 system review of systems was negative except as noted in HPI  special needs met: patient confined to wheel chair  Subjective:     Stephen [name] presents to the wound care clinic for evaluation of a left great toe wound.  He was admitted to [facility] 09/15/23 -09/21/23.  He was found down with a large liquid bowel movement.  He reportedly was dragging himself on the ceramic floor and developed pressure injuries to the great toes.  His nephew and POA is with him and reports it has greatly healed in the interim.  I personally reviewed the admission and discharge notes.  He was admitted to [facility] on 09/21/23 and has been residing there since.  I personally reviewed his admission note.  12/06/23  Initial evaluation.  His nephew reports history of venous duplex but no studies are seen in Epic except carotid duplex from 2015.  Objective:   BP 178/80   Pulse 54   Temp 36.8 C (98.2 F) (Temporal)   Resp 16   SpO2 98%  General:  in no acute distress  Pulmonary: CTAB, no wheezes, rhonci, crackles  CV:   RRR, S1,S2, no murmurs, gallops,rubs  GI: soft, bowel sounds active, non-tender  Neuro:    Alert. Difficult to understand  Psych :  Normal mood and judgement  Pulse :   +2 DP and PT bilaterally.  Wound:  ~ 0.7 x 0.4 cm  Predebridement    Post debridement             [1] No past medical history on file. [2] No past surgical history on file. [3] Allergies Allergen Reactions  . Benicar Hct [Olmesartan-Hydrochlorothiazide] Other (See Comments)    Renal damage  . Benicar [Olmesartan] Other (See Comments)    unknown  . Celecoxib Confusion  . Combigan [Brimonidine-Timolol ] Other (See Comments)    Eye irritation  . Crestor  [Rosuvastatin ] Other (See Comments)    Kidney problems  . Dorzolamide Other (See Comments)    Allergic reaction  . Doxycycline Other (See Comments)  unknown  . Hydralazine Analogues Other (See Comments)    unknown  . Levofloxacin Headache  . Metronidazole Headache  . Plavix  [Clopidogrel ] Other (See Comments)    Confusion and constipation  . Propoxyphene-Acetaminophen  Other (See Comments)    Severe headache and trouble urinating  . Tetracycline Other (See Comments)    Severe headache  . Clarithromycin  Rash    Rash and headache  [4] No current facility-administered medications for this visit. No current outpatient medications on file.  Facility-Administered Medications Ordered in Other Visits:  .  amlodipine  (NORVASC ) tablet 10 mg, 10 mg, Oral, Nightly (2000), Hasanaj, Yancey Skeens, MD, 10 mg at 12/05/23  2111 .  apixaban (ELIQUIS) tablet 5 mg, 5 mg, Oral, BID, Hasanaj, Xaje Adem, MD, 5 mg at 12/06/23 0926 .  aspirin  chewable tablet 81 mg, 81 mg, Oral, Daily, Hasanaj, Yancey Skeens, MD, 81 mg at 12/06/23 0916 .  cholecalciferol  (vitamin D3-125 mcg (5,000 unit)) capsule 125 mcg, 125 mcg, Oral, Daily, Hasanaj, Xaje Adem, MD, 125 mcg at 12/06/23 0926 .  coQ10 (ubiquinol) cap 1 capsule, 1 capsule, Oral, BID, Hasanaj, Xaje Adem, MD, 1 capsule at 12/06/23 0926 .  cyanocobalamin  (vitamin B-12) tablet 3,000 mcg, 3,000 mcg, Oral, Daily, Hasanaj, Xaje Adem, MD, 3,000 mcg at 12/06/23 0926 .  docusate sodium  (COLACE) capsule 100 mg, 100 mg, Oral, BID, Hasanaj, Xaje Adem, MD, 100 mg at 12/06/23 0926 .  gentamicin (GARAMYCIN) 0.3 % ophthalmic solution 1 drop, 1 drop, Both Eyes, TID, Hasanaj, Yancey Skeens, MD, 1 drop at 12/06/23 0916 .  latanoprost (XALATAN) 0.005 % ophthalmic solution 1 drop, 1 drop, Both Eyes, Nightly, Hasanaj, Xaje Adem, MD, 1 drop at 12/05/23 2111 .  magnesium  hydroxide (MILK OF MAGNESIA) oral suspension, 30 mL, Oral, Q12H PRN, Hasanaj, Xaje Adem, MD, 30 mL at 11/20/23 2030 .  magnesium  oxide Tab 1 tablet, 1 tablet, Oral, BID, Hasanaj, Xaje Adem, MD, 1 tablet at 12/06/23 0926 .  melatonin tablet 6 mg, 6 mg, Oral, QPM, Hasanaj, Xaje Adem, MD, 6 mg at 12/05/23 2111 .  metFORMIN (GLUCOPHAGE) tablet 500 mg, 500 mg, Oral, Daily, Hasanaj, Xaje Adem, MD, 500 mg at 12/06/23 0926 .  multivitamin with minerals tablet 1 tablet, 1 tablet, Oral, Nightly (2000), Hasanaj, Yancey Skeens, MD, 1 tablet at 12/05/23 2111 .  niacin  (NIASPAN ) CR tablet 1,000 mg, 1,000 mg, Oral, Nightly (2000), Hasanaj, Yancey Skeens, MD, 1,000 mg at 12/05/23 2111 .  polyethylene glycol (GLYCOLAX ) powder (BULK CONTAINER) 17 g, 17 g, Oral, Daily, Hasanaj, Xaje Adem, MD, 17 g at 12/06/23 0916 .  pravastatin (PRAVACHOL) tablet 40 mg, 40 mg, Oral, Nightly (2000), Hasanaj, Yancey Skeens, MD, 40 mg at 12/05/23 2111 .  timolol  (TIMOPTIC ) 0.25 % ophthalmic  solution 1 drop, 1 drop, Both Eyes, BID, Hasanaj, Xaje Adem, MD, 1 drop at 12/06/23 0926 .  traZODone  (DESYREL ) tablet 50 mg, 50 mg, Oral, Nightly, Hasanaj, Xaje Adem, MD, 50 mg at 12/05/23 2111 .  vitamin E -180 mg (400 unit) capsule 180 mg, 180 mg, Oral, Daily, Hasanaj, Xaje Adem, MD, 180 mg at 12/06/23 [phone]

## 2023-12-08 DIAGNOSIS — R296 Repeated falls: Secondary | ICD-10-CM | POA: Diagnosis not present

## 2023-12-08 DIAGNOSIS — R531 Weakness: Secondary | ICD-10-CM | POA: Diagnosis not present

## 2023-12-08 DIAGNOSIS — M6282 Rhabdomyolysis: Secondary | ICD-10-CM | POA: Diagnosis not present

## 2023-12-08 DIAGNOSIS — E7849 Other hyperlipidemia: Secondary | ICD-10-CM | POA: Diagnosis not present

## 2023-12-08 DIAGNOSIS — Z8673 Personal history of transient ischemic attack (TIA), and cerebral infarction without residual deficits: Secondary | ICD-10-CM | POA: Diagnosis not present

## 2023-12-08 DIAGNOSIS — R7881 Bacteremia: Secondary | ICD-10-CM | POA: Diagnosis not present

## 2023-12-08 DIAGNOSIS — E1143 Type 2 diabetes mellitus with diabetic autonomic (poly)neuropathy: Secondary | ICD-10-CM | POA: Diagnosis not present

## 2023-12-08 NOTE — Nursing Note (Addendum)
 Resident left facility @1035  with nephew [name] Senior via car. Resident took all belongings and medications. Took wheelchair and walker that was order via [place].

## 2023-12-08 NOTE — Discharge Summary (Signed)
 [facility] Discharge Summary        Admit date:    09/21/2023 Discharge date:   12/08/2023 Length of stay:    LOS: 78 days     Discharge Service:   [facility] Discharge Attending Physician: Yancey Reno, MD Discharge to:    To Home with Home Health Condition at Discharge:  good Code Status:    DNR and DNI  Patient Care Team: Shona Norleen Folk, MD as PCP - General  Summary  Consults       none  Discharge Diagnoses  Principal Problem:   Traumatic rhabdomyolysis Active Problems:   Repeated falls   Bacteremia   Lactic acidosis   Transaminitis   Elevated troponin level   Muscle weakness (generalized)   Mixed hyperlipidemia   Essential hypertension, benign   Glaucoma of both eyes   Chronic kidney disease, stage II (mild)   Carotid artery stenosis   Chronic constipation   Diabetes mellitus, type II (CMS-HCC)   Enlargement (benign) of prostate   Gastroesophageal reflux disease   Personal history of transient ischemic attack   Osteoarthritis   Traumatic rhabdomyolysis, initial encounter   Cognitive communication deficit   Insomnia   Vascular dementia with behavior disturbance (CMS-HCC)   Itchy eyes   Deep vein thrombosis of left lower extremity (CMS-HCC)   Eye infection   [facility]   At [facility] in [place]  patient was treated with combination physical therapy and Occupational Therapy with good response He was able to transfer without any difficulties in addition to him being able to walk with minimal assistance plus able to do 3 steps He was scheduled to have additional physical therapy and Occupational Therapy as an outpatient Also advised to see his primary care doctor in 2 weeks  I spent 30 mins in the discharge of this patient.  Procedures   none  Discharge Medications     Your Medication List     START taking these medications    amlodipine  10 MG tablet Commonly known as: NORVASC  Take 1 tablet (10 mg total) by mouth  nightly.   apixaban 5 mg Tab Commonly known as: ELIQUIS Take 1 tablet (5 mg total) by mouth two (2) times a day.   aspirin  81 MG chewable tablet Chew 1 tablet (81 mg total) daily. Start taking on: December 09, 2023   cholecalciferol  (vitamin D3-125 mcg (5,000 unit)) 125 mcg (5,000 unit) capsule Take 1 capsule (125 mcg total) by mouth in the morning. Start taking on: December 09, 2023   coQ10 (ubiquinol) 100 mg Cap Take 1 capsule by mouth two (2) times a day.   cyanocobalamin  (vitamin B-12) 1000 MCG tablet Take 3 tablets (3,000 mcg total) by mouth in the morning. Start taking on: December 09, 2023   docusate sodium  100 MG capsule Commonly known as: COLACE Take 1 capsule (100 mg total) by mouth two (2) times a day.   magnesium  oxide 500 mg magnesium  Tab Take 1 tablet by mouth two (2) times a day.   melatonin 3 mg Tab Take 2 tablets (6 mg total) by mouth every evening.   metFORMIN 500 MG tablet Commonly known as: GLUCOPHAGE Take 1 tablet (500 mg total) by mouth in the morning. Start taking on: December 09, 2023   multivitamin with minerals tablet Take 1 tablet by mouth nightly.   niacin  1000 MG CR tablet Commonly known as: NIASPAN  Take 1 tablet (1,000 mg total) by mouth nightly.   polyethylene glycol 17 gram/dose  powder Commonly known as: GLYCOLAX  Take 17 g by mouth daily. Start taking on: December 09, 2023   pravastatin 40 MG tablet Commonly known as: PRAVACHOL Take 1 tablet (40 mg total) by mouth nightly.   traZODone  50 MG tablet Commonly known as: DESYREL  Take 1 tablet (50 mg total) by mouth nightly.   vitamin E -180 mg (400 unit) 180 mg (400 unit) Cap capsule Take 1 capsule (180 mg total) by mouth in the morning. Start taking on: December 09, 2023        Pending Test Results   none   Lab Results   Results for orders placed or performed during the hospital encounter of 09/21/23  Urine Culture   Specimen: Clean Catch; Urine  Result Value Ref  Range   Urine Culture, Comprehensive (A)     Mixed Gram Positive/Gram Negative Organisms Isolated  Comprehensive Metabolic Panel  Result Value Ref Range   Sodium 137 135 - 145 mmol/L   Potassium 4.4 3.5 - 5.0 mmol/L   Chloride 101 98 - 107 mmol/L   CO2 28.1 21.0 - 32.0 mmol/L   Anion Gap 8 3 - 11 mmol/L   BUN 27 (H) 8 - 20 mg/dL   Creatinine 8.63 (H) 9.19 - 1.30 mg/dL   BUN/Creatinine Ratio 20    eGFR CKD-EPI (2021) Male 51 (L) >=60 mL/min/1.37m2   Glucose 177 70 - 179 mg/dL   Calcium  8.7 8.5 - 10.1 mg/dL   Albumin 3.3 (L) 3.5 - 5.0 g/dL   Total Protein 6.9 6.0 - 8.0 g/dL   Total Bilirubin 0.3 0.3 - 1.2 mg/dL   AST 21 15 - 40 U/L   ALT 25 12 - 78 U/L   Alkaline Phosphatase 118 (H) 46 - 116 U/L  Urinalysis with Microscopy  Result Value Ref Range   Color, UA Yellow    Clarity, UA Clear Clear   Specific Gravity, UA 1.026 (H) 1.010 - 1.025   pH, UA 5.5 5.0 - 8.0   Leukocyte Esterase, UA Negative Negative   Nitrite, UA Negative Negative   Protein, UA Trace (A) Negative   Glucose, UA Negative Negative, Trace   Ketones, UA Negative Negative   Urobilinogen, UA <2.0 mg/dL <7.9 mg/dL   Bilirubin, UA Negative Negative   Blood, UA Negative Negative   RBC, UA 5 (H) 0 - 3 /HPF   WBC, UA 1 0 - 3 /HPF   WBC Clumps None Seen None Seen /HPF   Squam Epithel, UA 0 0 - 10 /HPF   Yeast, UA None Seen None Seen /HPF   Bacteria, UA None Seen None Seen /HPF   Hyphal Yeast None Seen None Seen /HPF  Ammonia  Result Value Ref Range   Ammonia <10 (L) 11 - 32 umol/L  Lipid Panel, Non-Fasting  Result Value Ref Range   Cholesterol, Total 109 <200 mg/dL   Cholesterol, HDL 64 >59 mg/dL   Cholesterol, LDL, Calculated 42 <100 mg/dL   Cholesterol, Non-HDL, Calculated 45 <130 mg/dL   Triglycerides 25 <849 mg/dL   Fasting No   Hemoglobin A1c  Result Value Ref Range   Hemoglobin A1C 6.5 (H) 4.8 - 5.6 %   Estimated Average Glucose 140 mg/dL  Comprehensive Metabolic Panel  Result Value Ref Range    Sodium 141 135 - 145 mmol/L   Potassium 4.2 3.5 - 5.0 mmol/L   Chloride 103 98 - 107 mmol/L   CO2 32.6 (H) 21.0 - 32.0 mmol/L   Anion Gap 5 3 - 11 mmol/L  BUN 26 (H) 8 - 20 mg/dL   Creatinine 8.93 9.19 - 1.30 mg/dL   BUN/Creatinine Ratio 25    eGFR CKD-EPI (2021) Male 68 >=60 mL/min/1.8m2   Glucose 92 70 - 179 mg/dL   Calcium  8.3 (L) 8.5 - 10.1 mg/dL   Albumin 3.1 (L) 3.5 - 5.0 g/dL   Total Protein 5.8 (L) 6.0 - 8.0 g/dL   Total Bilirubin 0.3 0.3 - 1.2 mg/dL   AST 18 15 - 40 U/L   ALT 20 12 - 78 U/L   Alkaline Phosphatase 110 46 - 116 U/L  Urinalysis with Microscopy  Result Value Ref Range   Color, UA Light Yellow    Clarity, UA Clear Clear   Specific Gravity, UA 1.012 1.010 - 1.025   pH, UA 7.5 5.0 - 8.0   Leukocyte Esterase, UA Negative Negative   Nitrite, UA Negative Negative   Protein, UA Negative Negative   Glucose, UA Trace Negative, Trace   Ketones, UA Negative Negative   Urobilinogen, UA <2.0 mg/dL <7.9 mg/dL   Bilirubin, UA Negative Negative   Blood, UA Negative Negative   RBC, UA <1 0 - 3 /HPF   WBC, UA <1 0 - 3 /HPF   WBC Clumps None Seen None Seen /HPF   Squam Epithel, UA 0 0 - 10 /HPF   Yeast, UA None Seen None Seen /HPF   Bacteria, UA None Seen None Seen /HPF   Hyphal Yeast None Seen None Seen /HPF  POCT Glucose  Result Value Ref Range   Glucose, POC 126 (H) 70 - 105 mg/dL  POCT Glucose  Result Value Ref Range   Glucose, POC 163 (H) 70 - 105 mg/dL  POCT Glucose  Result Value Ref Range   Glucose, POC 151 (H) 70 - 105 mg/dL  POCT Glucose  Result Value Ref Range   Glucose, POC 167 (H) 70 - 105 mg/dL  POCT Glucose  Result Value Ref Range   Glucose, POC 283 (H) 70 - 105 mg/dL  POCT Glucose  Result Value Ref Range   Glucose, POC 134 (H) 70 - 105 mg/dL  POCT Glucose  Result Value Ref Range   Glucose, POC 228 (H) 70 - 105 mg/dL  POCT Glucose  Result Value Ref Range   Glucose, POC 131 (H) 70 - 105 mg/dL  POCT Glucose  Result Value Ref Range    Glucose, POC 132 (H) 70 - 105 mg/dL  POCT Glucose  Result Value Ref Range   Glucose, POC 147 (H) 70 - 105 mg/dL  POCT Glucose  Result Value Ref Range   Glucose, POC 112 (H) 70 - 105 mg/dL  POCT Glucose  Result Value Ref Range   Glucose, POC 210 (H) 70 - 105 mg/dL  POCT Glucose  Result Value Ref Range   Glucose, POC 177 (H) 70 - 105 mg/dL  POCT Glucose  Result Value Ref Range   Glucose, POC 115 (H) 70 - 105 mg/dL  POCT Glucose  Result Value Ref Range   Glucose, POC 274 (H) 70 - 105 mg/dL  POCT Glucose  Result Value Ref Range   Glucose, POC 218 (H) 70 - 105 mg/dL  POCT Glucose  Result Value Ref Range   Glucose, POC 167 (H) 70 - 105 mg/dL  POCT Glucose  Result Value Ref Range   Glucose, POC 153 (H) 70 - 105 mg/dL  POCT Glucose  Result Value Ref Range   Glucose, POC 259 (H) 70 - 105 mg/dL  POCT  Glucose  Result Value Ref Range   Glucose, POC 141 (H) 70 - 105 mg/dL  POCT Glucose  Result Value Ref Range   Glucose, POC 218 (H) 70 - 105 mg/dL  POCT Glucose  Result Value Ref Range   Glucose, POC 135 (H) 70 - 105 mg/dL  POCT Glucose  Result Value Ref Range   Glucose, POC 128 (H) 70 - 105 mg/dL  POCT Glucose  Result Value Ref Range   Glucose, POC 139 (H) 70 - 105 mg/dL  POCT Glucose  Result Value Ref Range   Glucose, POC 150 (H) 70 - 105 mg/dL  POCT Glucose  Result Value Ref Range   Glucose, POC 231 (H) 70 - 105 mg/dL  POCT Glucose  Result Value Ref Range   Glucose, POC 129 (H) 70 - 105 mg/dL  POCT Glucose  Result Value Ref Range   Glucose, POC 158 (H) 70 - 105 mg/dL  POCT Glucose  Result Value Ref Range   Glucose, POC 152 (H) 70 - 105 mg/dL  POCT Glucose  Result Value Ref Range   Glucose, POC 119 (H) 70 - 105 mg/dL  POCT Glucose  Result Value Ref Range   Glucose, POC 125 (H) 70 - 105 mg/dL  POCT Glucose  Result Value Ref Range   Glucose, POC 146 (H) 70 - 105 mg/dL  POCT Glucose  Result Value Ref Range   Glucose, POC 217 (H) 70 - 105 mg/dL  POCT Glucose   Result Value Ref Range   Glucose, POC 143 (H) 70 - 105 mg/dL  POCT Glucose  Result Value Ref Range   Glucose, POC 112 (H) 70 - 105 mg/dL  POCT Glucose  Result Value Ref Range   Glucose, POC 238 (H) 70 - 105 mg/dL  POCT Glucose  Result Value Ref Range   Glucose, POC 203 (H) 70 - 105 mg/dL  POCT Glucose  Result Value Ref Range   Glucose, POC 168 (H) 70 - 105 mg/dL  POCT Glucose  Result Value Ref Range   Glucose, POC 168 (H) 70 - 105 mg/dL  POCT Glucose  Result Value Ref Range   Glucose, POC 204 (H) 70 - 105 mg/dL  POCT Glucose  Result Value Ref Range   Glucose, POC 170 (H) 70 - 105 mg/dL  POCT Glucose  Result Value Ref Range   Glucose, POC 205 (H) 70 - 105 mg/dL  POCT Glucose  Result Value Ref Range   Glucose, POC 125 (H) 70 - 105 mg/dL  POCT Glucose  Result Value Ref Range   Glucose, POC 121 (H) 70 - 105 mg/dL  POCT Glucose  Result Value Ref Range   Glucose, POC 138 (H) 70 - 105 mg/dL  POCT Glucose  Result Value Ref Range   Glucose, POC 165 (H) 70 - 105 mg/dL  POCT Glucose  Result Value Ref Range   Glucose, POC 164 (H) 70 - 105 mg/dL  POCT Glucose  Result Value Ref Range   Glucose, POC 142 (H) 70 - 105 mg/dL  POCT Glucose  Result Value Ref Range   Glucose, POC 167 (H) 70 - 105 mg/dL  POCT Glucose  Result Value Ref Range   Glucose, POC 152 (H) 70 - 105 mg/dL  POCT Glucose  Result Value Ref Range   Glucose, POC 160 (H) 70 - 105 mg/dL  POCT Glucose  Result Value Ref Range   Glucose, POC 178 (H) 70 - 105 mg/dL  POCT Glucose  Result Value Ref  Range   Glucose, POC 149 (H) 70 - 105 mg/dL  POCT Glucose  Result Value Ref Range   Glucose, POC 150 (H) 70 - 105 mg/dL  POCT Glucose  Result Value Ref Range   Glucose, POC 199 (H) 70 - 105 mg/dL  POCT Glucose  Result Value Ref Range   Glucose, POC 206 (H) 70 - 105 mg/dL  POCT Glucose  Result Value Ref Range   Glucose, POC 154 (H) 70 - 105 mg/dL  POCT Glucose  Result Value Ref Range   Glucose, POC 127 (H) 70 -  105 mg/dL  POCT Glucose  Result Value Ref Range   Glucose, POC 115 (H) 70 - 105 mg/dL  POCT Glucose  Result Value Ref Range   Glucose, POC 102 70 - 105 mg/dL  POCT Glucose  Result Value Ref Range   Glucose, POC 235 (H) 70 - 105 mg/dL  POCT Glucose  Result Value Ref Range   Glucose, POC 174 (H) 70 - 105 mg/dL  POCT Glucose  Result Value Ref Range   Glucose, POC 136 (H) 70 - 105 mg/dL  POCT Glucose  Result Value Ref Range   Glucose, POC 187 (H) 70 - 105 mg/dL  POCT Glucose  Result Value Ref Range   Glucose, POC 163 (H) 70 - 105 mg/dL  POCT Glucose  Result Value Ref Range   Glucose, POC 132 (H) 70 - 105 mg/dL  POCT Glucose  Result Value Ref Range   Glucose, POC 149 (H) 70 - 105 mg/dL  POCT Glucose  Result Value Ref Range   Glucose, POC 132 (H) 70 - 105 mg/dL  POCT Glucose  Result Value Ref Range   Glucose, POC 211 (H) 70 - 105 mg/dL  POCT Glucose  Result Value Ref Range   Glucose, POC 139 (H) 70 - 105 mg/dL  POCT Glucose  Result Value Ref Range   Glucose, POC 135 (H) 70 - 105 mg/dL  POCT Glucose  Result Value Ref Range   Glucose, POC 136 (H) 70 - 105 mg/dL  POCT Glucose  Result Value Ref Range   Glucose, POC 137 (H) 70 - 105 mg/dL  POCT Glucose  Result Value Ref Range   Glucose, POC 131 (H) 70 - 105 mg/dL  POCT Glucose  Result Value Ref Range   Glucose, POC 161 (H) 70 - 105 mg/dL  POCT Glucose  Result Value Ref Range   Glucose, POC 233 (H) 70 - 105 mg/dL  POCT Glucose  Result Value Ref Range   Glucose, POC 167 (H) 70 - 105 mg/dL  CBC w/ Differential  Result Value Ref Range   WBC 9.4 4.0 - 10.5 10*9/L   RBC 4.22 4.10 - 5.60 10*12/L   HGB 13.9 12.5 - 17.0 g/dL   HCT 59.0 63.9 - 49.9 %   MCV 96.9 80.0 - 98.0 fL   MCH 32.9 27.0 - 34.0 pg   MCHC 34.0 32.0 - 36.0 g/dL   RDW 86.7 88.4 - 85.4 %   MPV 12.4 (H) 7.4 - 10.4 fL   Platelet 160 140 - 415 10*9/L   Neutrophils % 70.9 %   Lymphocytes % 17.3 %   Monocytes % 7.7 %   Eosinophils % 3.2 %   Basophils %  0.5 %   Absolute Neutrophils 6.6 1.8 - 7.8 10*9/L   Absolute Lymphocytes 1.6 0.7 - 4.5 10*9/L   Absolute Monocytes 0.7 0.1 - 1.0 10*9/L   Absolute Eosinophils 0.3 0.0 - 0.4 10*9/L  Absolute Basophils 0.1 0.0 - 0.2 10*9/L    Imaging   No results found.  Discharge Instructions   Diet Instructions   Heart Healthy Consistent carbohydrate     Activity Instructions   Ambulate w assistance     Follow Up instructions and Outpatient Referrals    Ambulatory Referral to Wound Clinic     Reason for referral: DTI to left great toe   Ambulatory Referral to Home Health     If non-routine, reason for priority: PCP will probably be the one that  comes to [facility]   Reason for referral: Home Health [facility]   Requested follow up plan: I would resume responsibility.   Disciplines requested:  Nursing Physical Therapy Occupational Therapy     Nursing requested: Wound Care   Wound count: Wound 1   Wound 1 type/staging: Stage 2   Wound 1 location: L Great toe   Wound 1 care orders: Cleanse woulnd with wound cleanser.Apply Aquacel Ag  to wound bed.Wrap w kerlix & ace bandage.   Wound 1 order frequency: M-W-F & PRN if soiled   Physical Therapy requested:  Home safety evaluation Ambulation training Transfer training Strengthening exercises Evaluate and treat     Occupational Therapy Requested:  Home safety evaluation ADL or IADL training Cognitive training Evaluate and treat     Physician to follow patient's care: PCP   Requested start of care date: Routine (within 48 hours)    Resources and Referrals   [facility] for nursing, PT and OT   [phone]       Yancey DELENA Reno, MD

## 2023-12-19 DIAGNOSIS — Z5189 Encounter for other specified aftercare: Secondary | ICD-10-CM | POA: Diagnosis not present

## 2024-02-07 ENCOUNTER — Encounter: Payer: Self-pay | Admitting: Physician Assistant

## 2024-04-11 ENCOUNTER — Ambulatory Visit: Payer: Self-pay | Admitting: Physician Assistant

## 2024-04-11 ENCOUNTER — Ambulatory Visit: Payer: Self-pay
